# Patient Record
Sex: Male | Born: 1960 | Race: Black or African American | Hispanic: No | Smoking: Current every day smoker
Health system: Southern US, Community
[De-identification: ages and names within clinical notes are randomized; demographics above are authoritative.]

## PROBLEM LIST (undated history)

## (undated) DIAGNOSIS — M1990 Unspecified osteoarthritis, unspecified site: Secondary | ICD-10-CM

## (undated) DIAGNOSIS — G459 Transient cerebral ischemic attack, unspecified: Secondary | ICD-10-CM

## (undated) DIAGNOSIS — I10 Essential (primary) hypertension: Secondary | ICD-10-CM

## (undated) DIAGNOSIS — R0600 Dyspnea, unspecified: Secondary | ICD-10-CM

## (undated) DIAGNOSIS — S31119A Laceration without foreign body of abdominal wall, unspecified quadrant without penetration into peritoneal cavity, initial encounter: Secondary | ICD-10-CM

## (undated) DIAGNOSIS — I639 Cerebral infarction, unspecified: Secondary | ICD-10-CM

## (undated) DIAGNOSIS — F32A Depression, unspecified: Secondary | ICD-10-CM

## (undated) DIAGNOSIS — F419 Anxiety disorder, unspecified: Secondary | ICD-10-CM

## (undated) HISTORY — DX: Depression, unspecified: F32.A

## (undated) HISTORY — PX: OTHER SURGICAL HISTORY: SHX169

## (undated) HISTORY — DX: Transient cerebral ischemic attack, unspecified: G45.9

## (undated) HISTORY — DX: Anxiety disorder, unspecified: F41.9

---

## 2001-11-17 ENCOUNTER — Emergency Department (HOSPITAL_COMMUNITY): Admission: EM | Admit: 2001-11-17 | Discharge: 2001-11-17 | Payer: Self-pay | Admitting: Internal Medicine

## 2004-02-21 ENCOUNTER — Emergency Department (HOSPITAL_COMMUNITY): Admission: EM | Admit: 2004-02-21 | Discharge: 2004-02-21 | Payer: Self-pay | Admitting: Emergency Medicine

## 2005-01-04 ENCOUNTER — Emergency Department (HOSPITAL_COMMUNITY): Admission: EM | Admit: 2005-01-04 | Discharge: 2005-01-04 | Payer: Self-pay | Admitting: Emergency Medicine

## 2005-05-01 ENCOUNTER — Ambulatory Visit (HOSPITAL_COMMUNITY): Admission: RE | Admit: 2005-05-01 | Discharge: 2005-05-01 | Payer: Self-pay | Admitting: Internal Medicine

## 2005-05-01 IMAGING — CR DG LUMBAR SPINE COMPLETE 4+V
5 series · 5 of 5 positions shown · non-contrast
Comparison: none

HISTORY: Back pain

LUMBAR SPINE 4 VIEWS:
5 lumbar vertebra.
Vertebral body and disc space heights maintained.
No fracture or subluxation.
Tiny superior endplate spur anteriorly at T12.
No spondylolysis or bone destruction.

[view not recorded (1 of 5)]
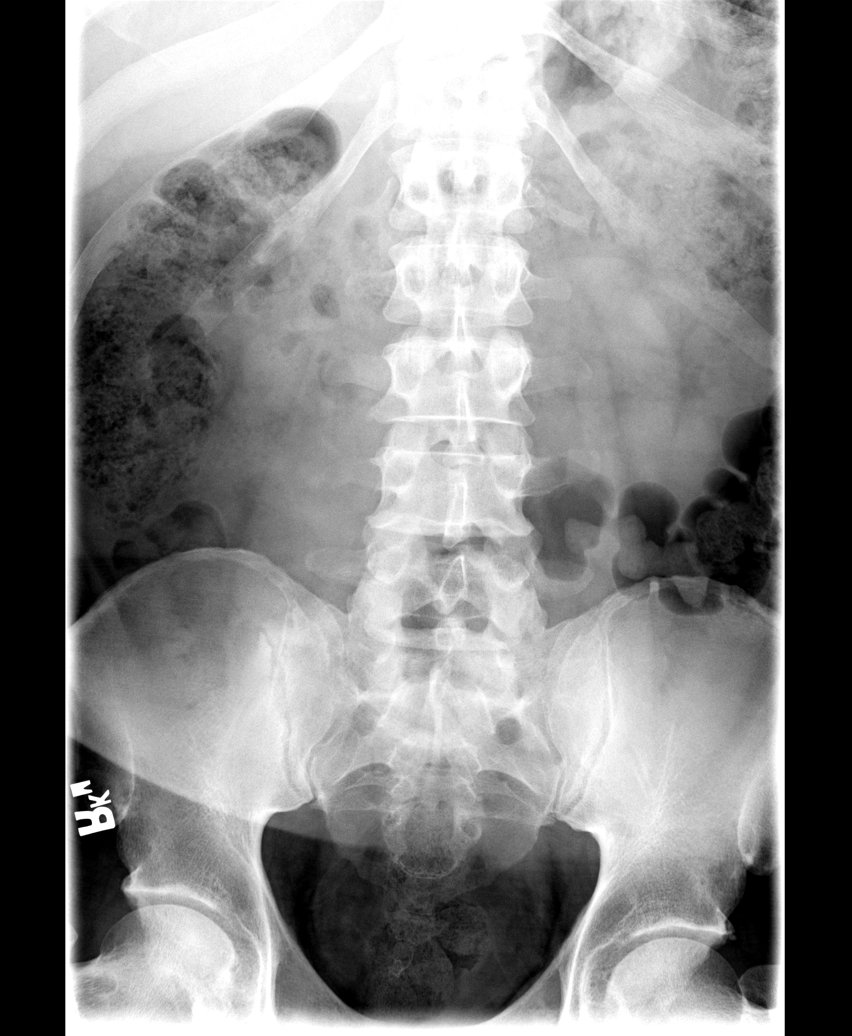

[view not recorded (2 of 5)]
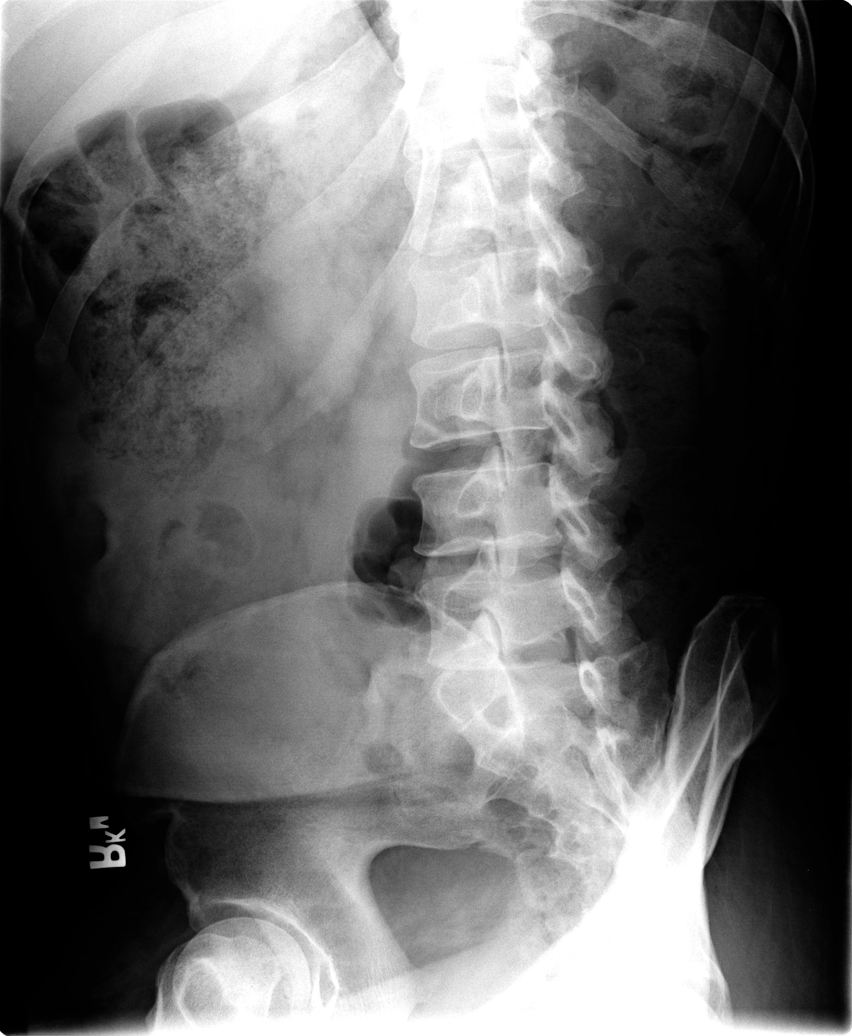

[view not recorded (3 of 5)]
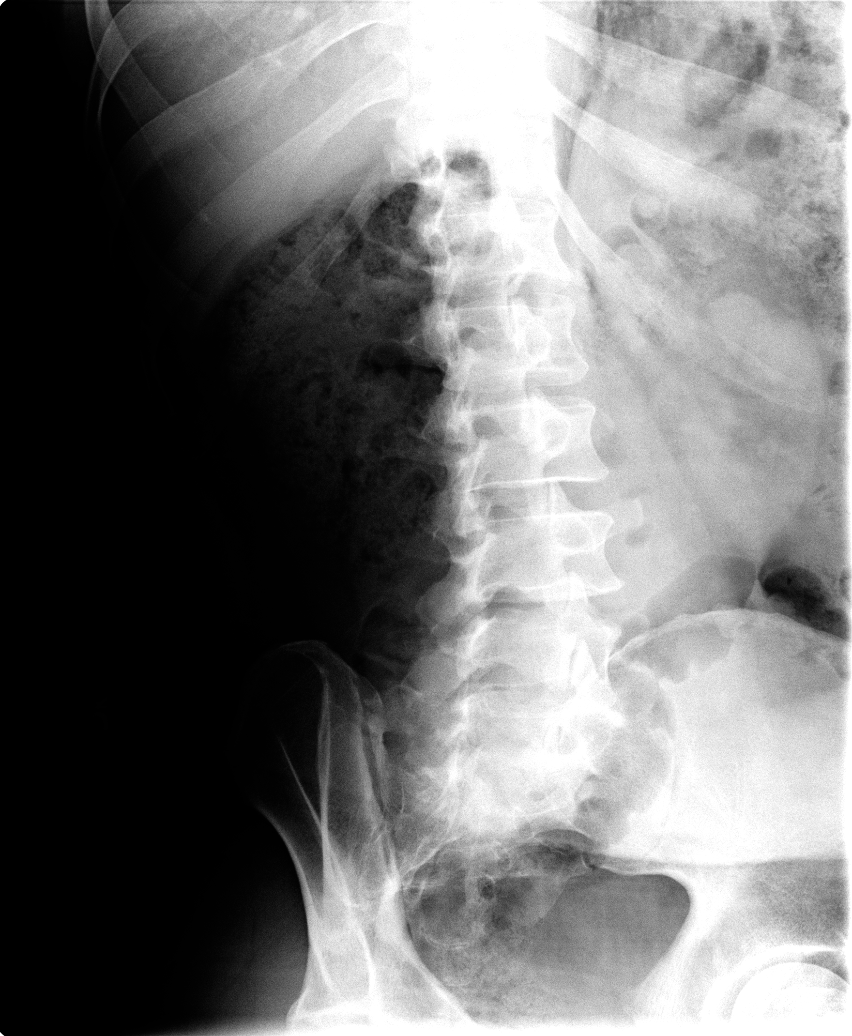

[view not recorded (4 of 5)]
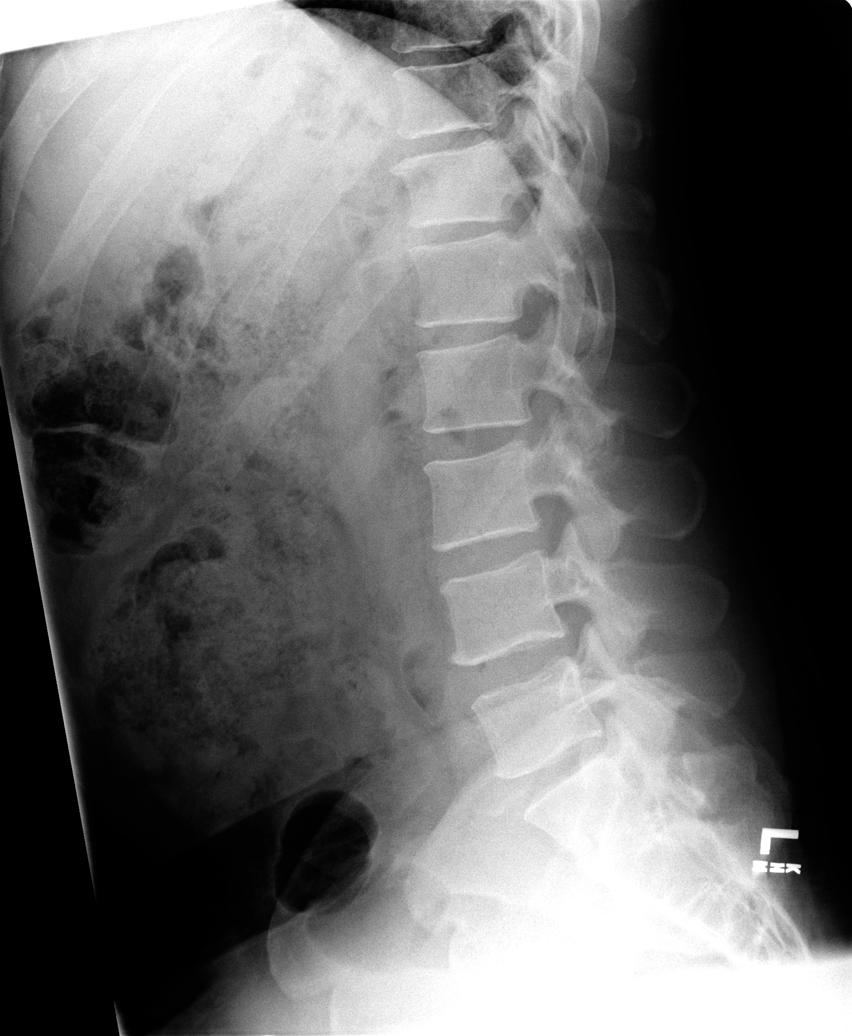

[view not recorded (5 of 5)]
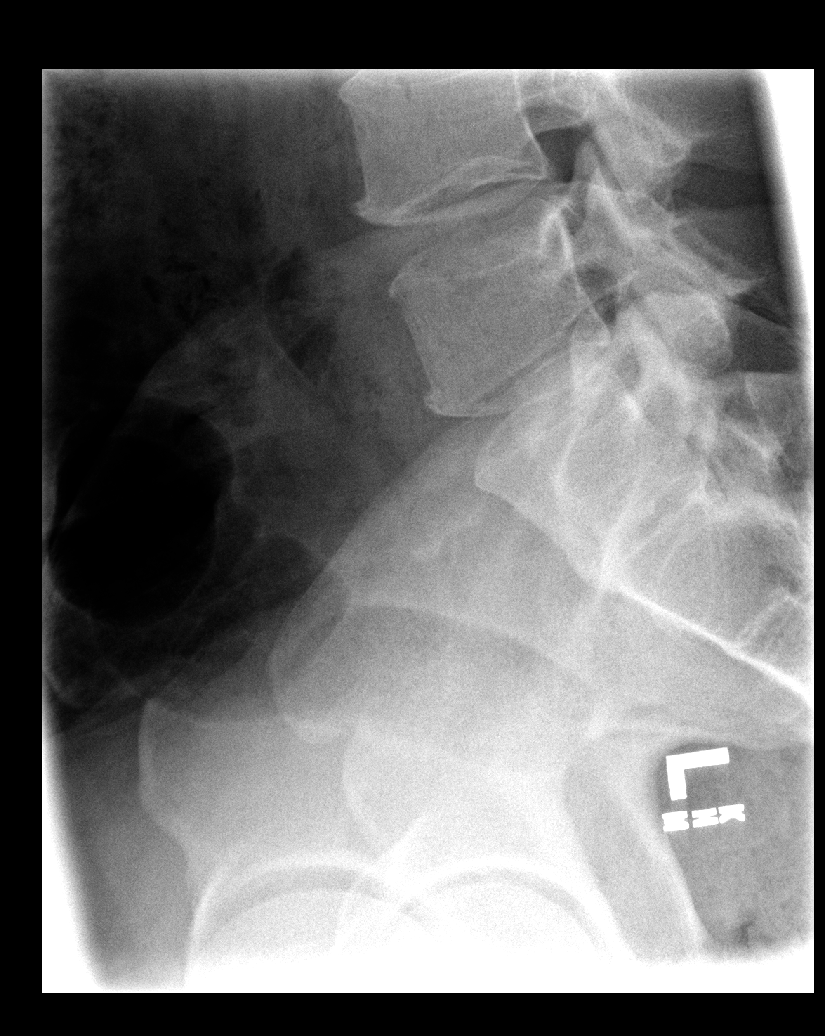

[5 of 5 positions shown; findings below may reference images not displayed]

IMPRESSION: No acute abnormalities.

## 2005-09-22 ENCOUNTER — Emergency Department (HOSPITAL_COMMUNITY): Admission: EM | Admit: 2005-09-22 | Discharge: 2005-09-22 | Payer: Self-pay | Admitting: Emergency Medicine
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
## 2006-05-14 ENCOUNTER — Emergency Department (HOSPITAL_COMMUNITY): Admission: EM | Admit: 2006-05-14 | Discharge: 2006-05-14 | Payer: Self-pay | Admitting: Emergency Medicine

## 2006-05-14 IMAGING — CR DG CERVICAL SPINE WITH FLEX & EXTEND
7 series · 7 of 7 positions shown · non-contrast
Comparison: [DATE].

CLINICAL DATA: Bilateral arm pain for one week.  
 COMPLETE CERVICAL SPINE WITH LATERAL FLEXION AND EXTENSION VIEWS ? 6 VIEWS ? [DATE]:

[view not recorded (1 of 7)]
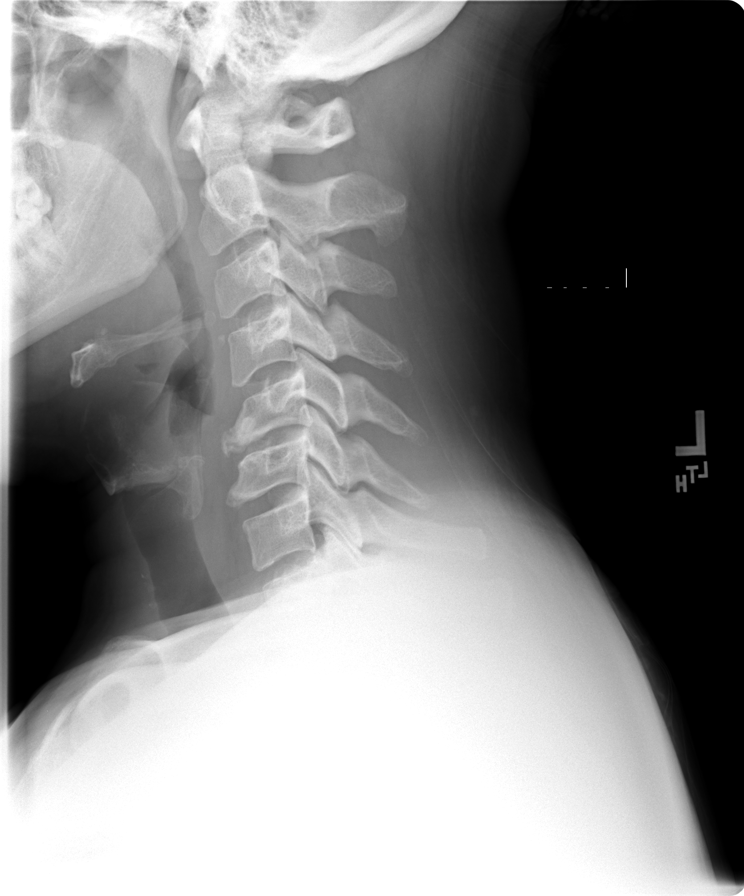

[view not recorded (2 of 7)]
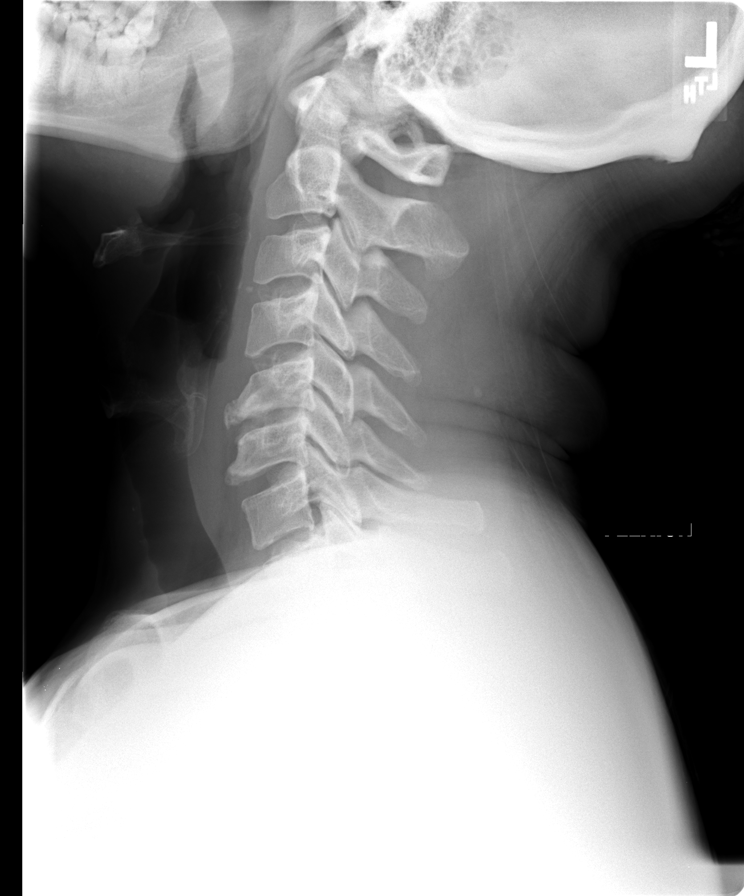

[view not recorded (3 of 7)]
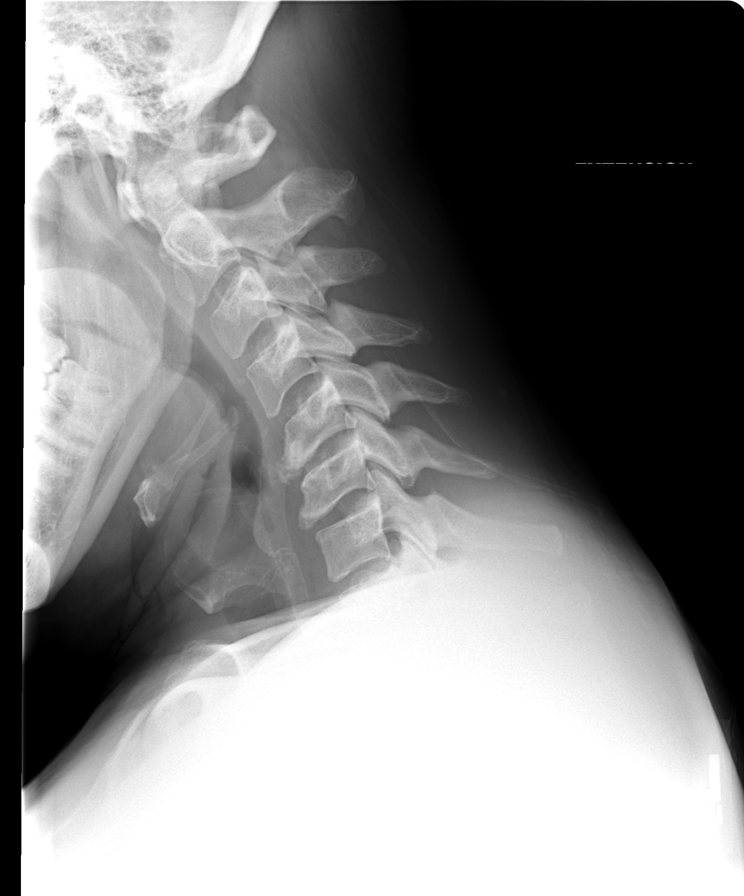

[view not recorded (4 of 7)]
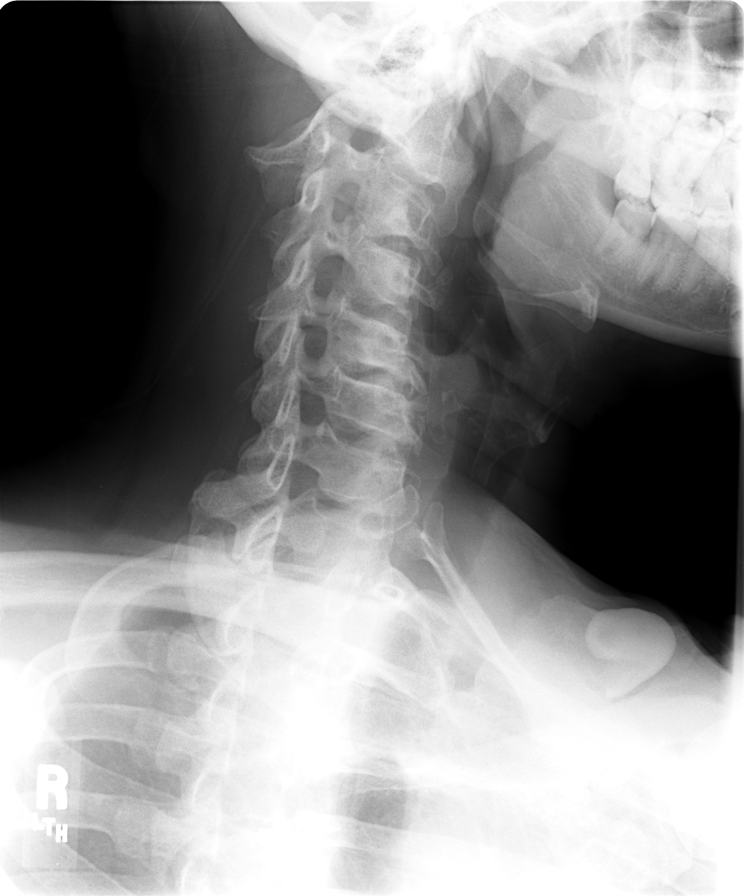

[view not recorded (5 of 7)]
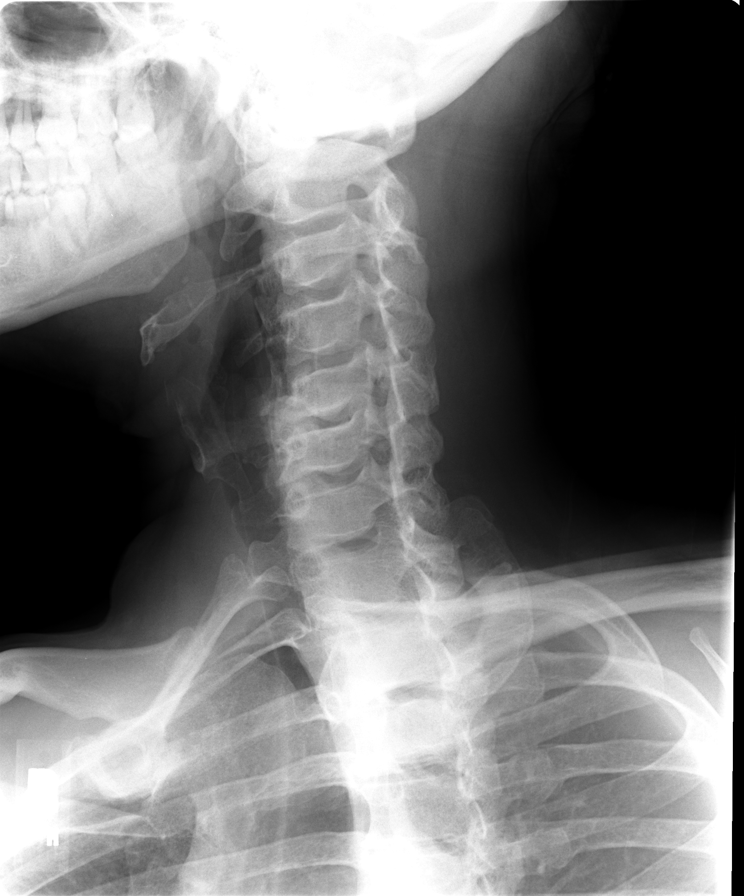

[view not recorded (6 of 7)]
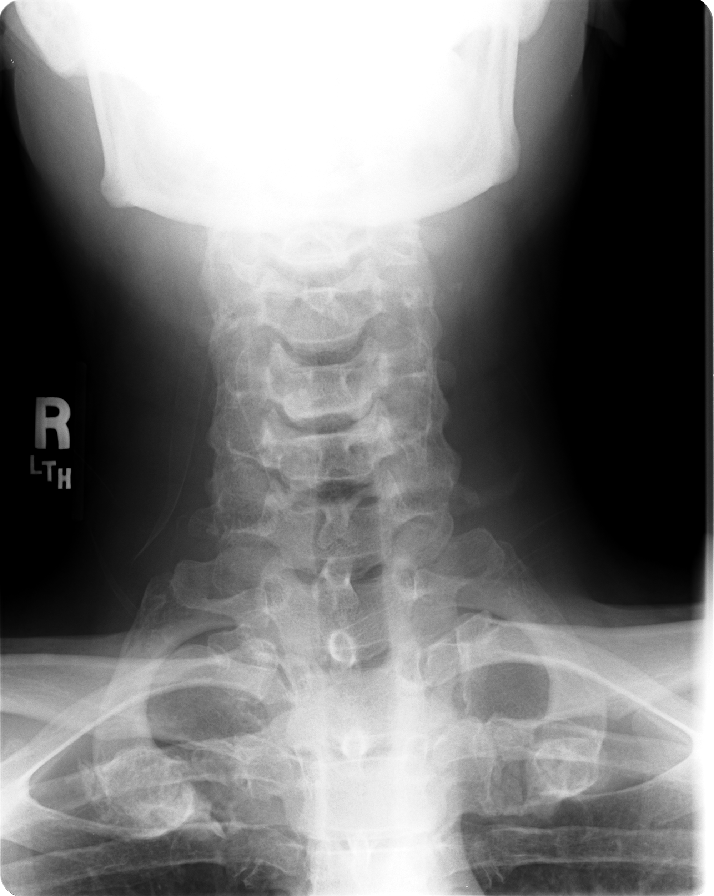

[view not recorded (7 of 7)]
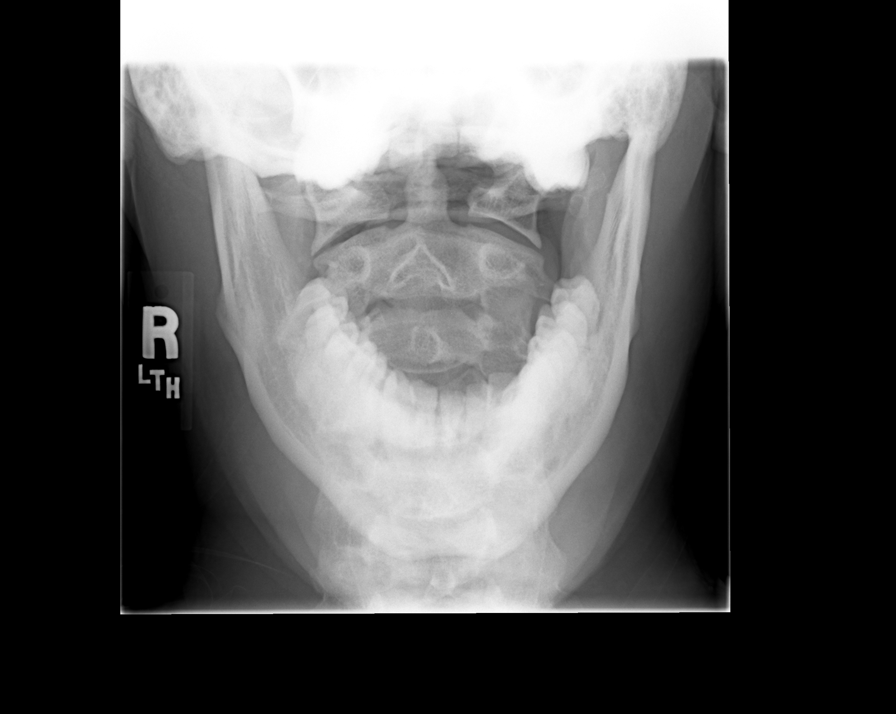

[7 of 7 positions shown; findings below may reference images not displayed]

FINDINGS: The patient has some degenerative disk disease at C5-6 with slight disk space narrowing and some mild posterior spurring and slightly more prominent spurring at the anterior-inferior aspects of C5 and C6.  There is approximately 2 mm of retrolisthesis of C5 on C6 in the neutral position with a reversal of the cervical lordosis at that level.  With flexion the minimal retrolisthesis is completely reduced.  There is no abnormal anterior subluxation with flexion.  With extension, there is no increase in the retrolisthesis.  The AP dimension of the spinal canal appears normal on the lateral views.  The neural foramina are widely patent throughout the cervical spine.  There is no significant facet joint disease.
IMPRESSION: No acute abnormality.   Mild degenerative disk disease at C5-6 with some spurring as described.  No change since [DATE].

## 2007-11-15 ENCOUNTER — Emergency Department (HOSPITAL_COMMUNITY): Admission: EM | Admit: 2007-11-15 | Discharge: 2007-11-15 | Payer: Self-pay | Admitting: Emergency Medicine

## 2009-02-01 ENCOUNTER — Emergency Department (HOSPITAL_COMMUNITY): Admission: EM | Admit: 2009-02-01 | Discharge: 2009-02-01 | Payer: Self-pay | Admitting: Emergency Medicine

## 2009-02-24 ENCOUNTER — Emergency Department (HOSPITAL_COMMUNITY): Admission: EM | Admit: 2009-02-24 | Discharge: 2009-02-24 | Payer: Self-pay | Admitting: Emergency Medicine

## 2009-02-24 IMAGING — CR DG ANKLE COMPLETE 3+V*R*
3 series · 3 of 3 positions shown · non-contrast
Comparison: None

CLINICAL DATA: Injury, lateral pain.

RIGHT ANKLE - COMPLETE 3+ VIEW

[view not recorded (1 of 3)]
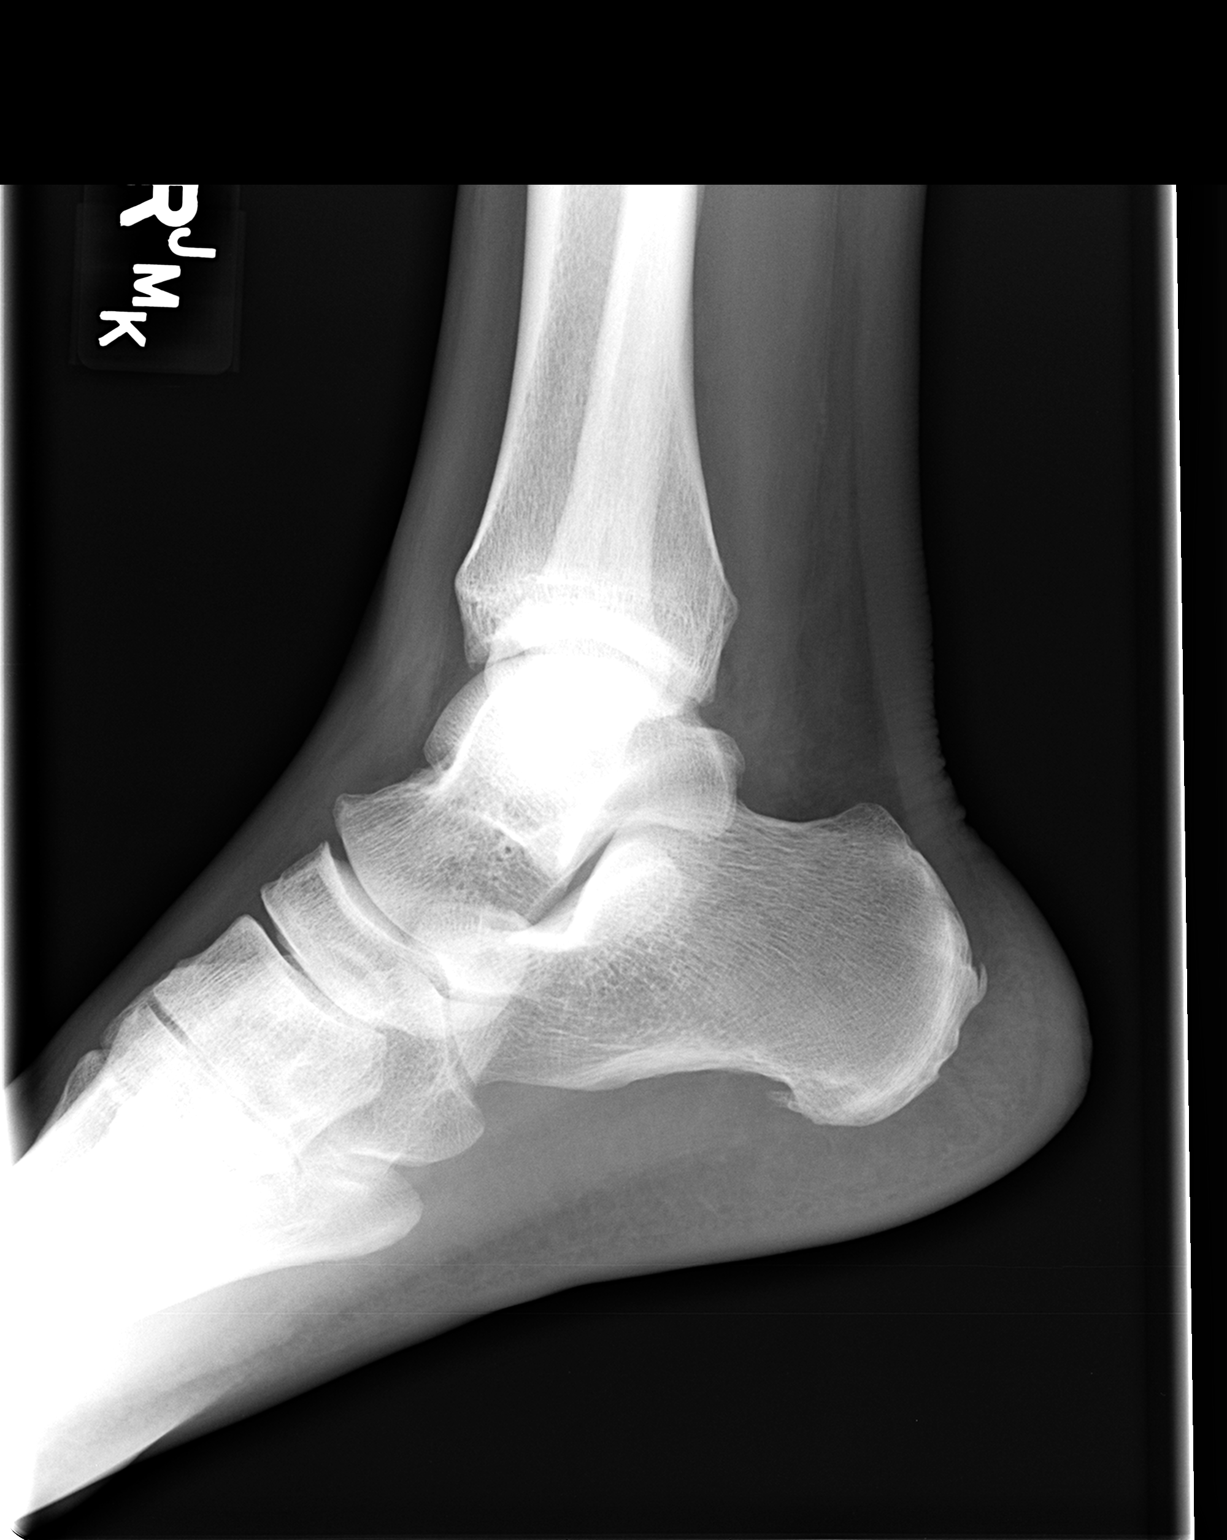

[view not recorded (2 of 3)]
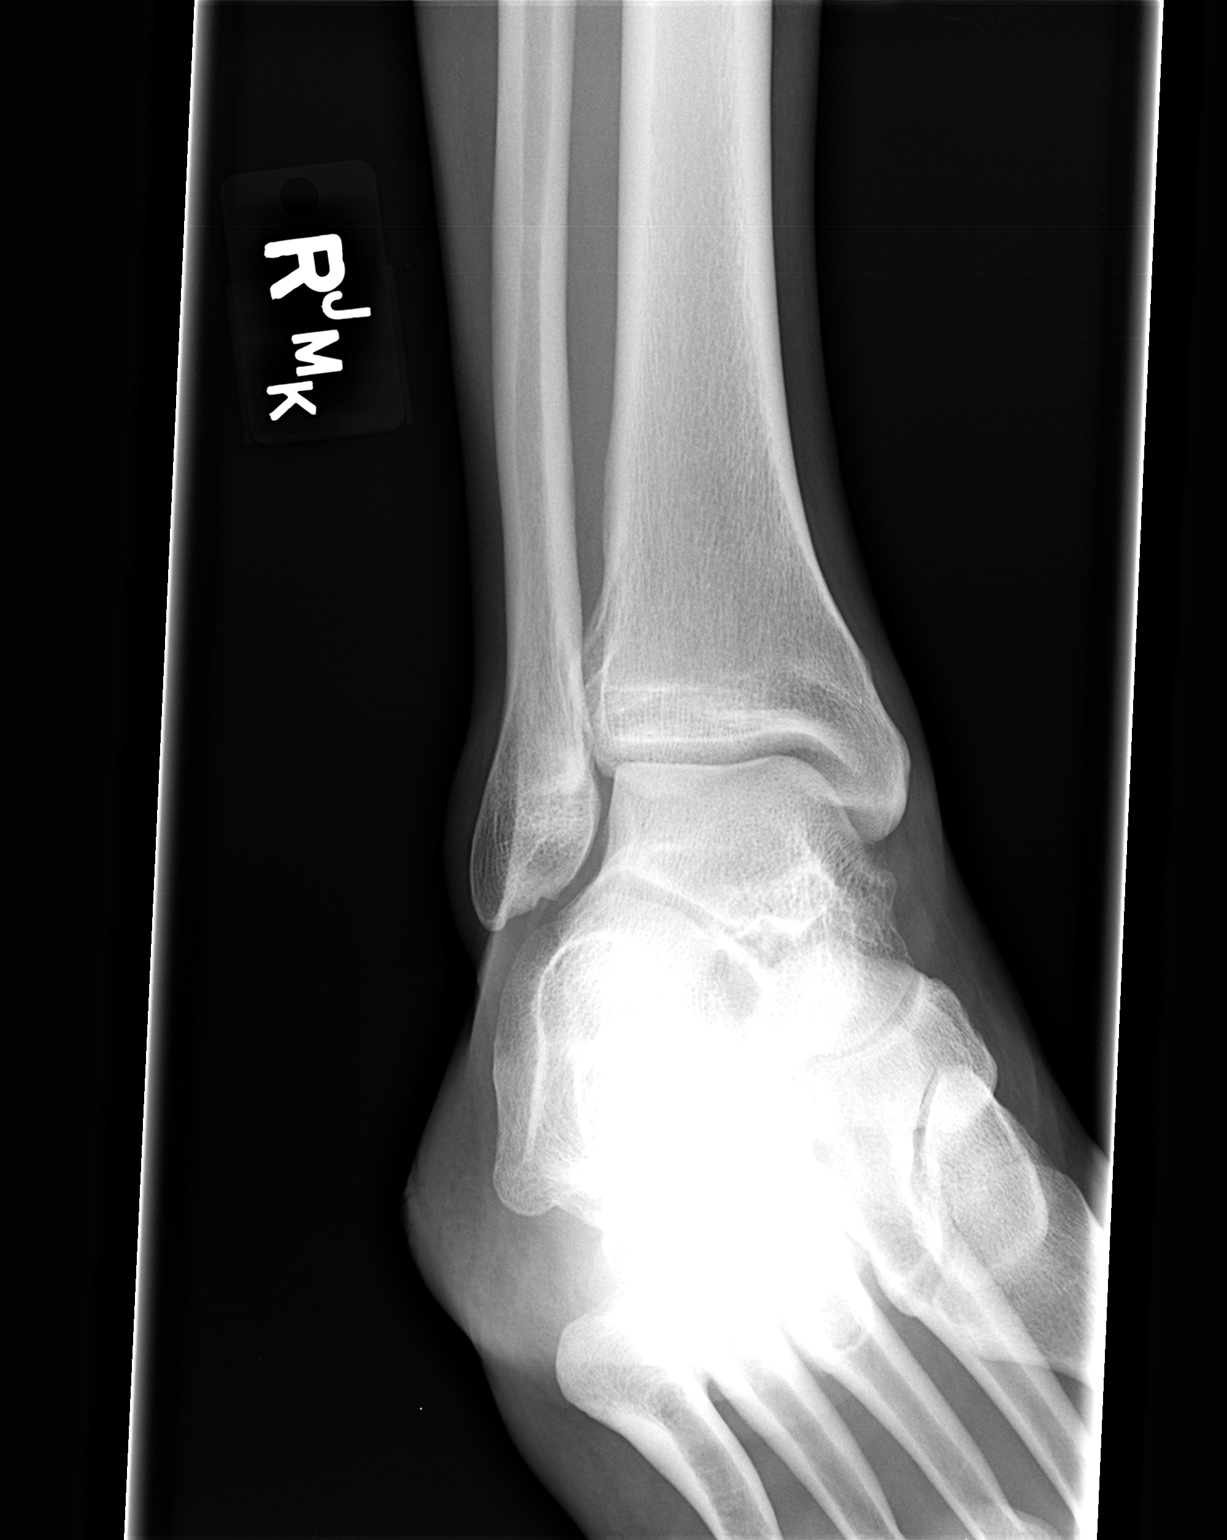

[view not recorded (3 of 3)]
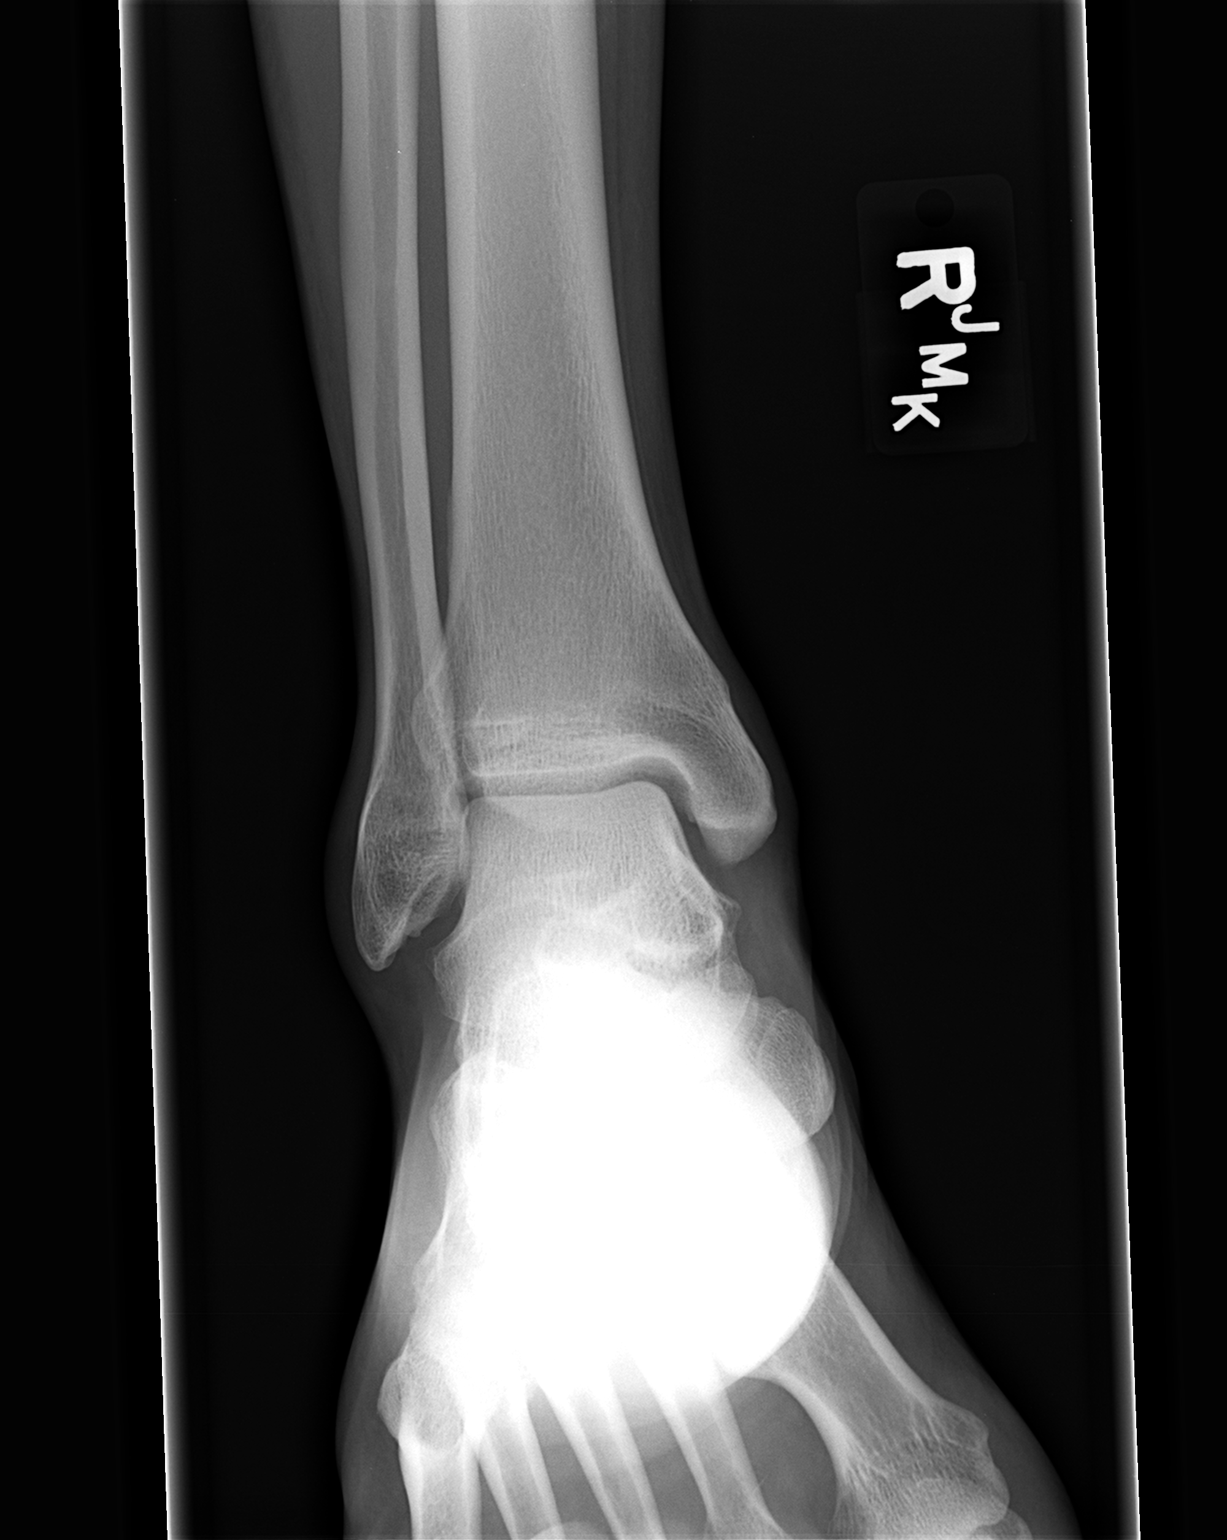

[3 of 3 positions shown; findings below may reference images not displayed]

FINDINGS: No acute bony abnormality.  Specifically, no fracture,
subluxation, or dislocation.  Soft tissues are intact. Calcaneal
spur noted.
IMPRESSION: No acute findings.

## 2010-03-31 ENCOUNTER — Emergency Department (HOSPITAL_COMMUNITY): Admission: EM | Admit: 2010-03-31 | Discharge: 2010-04-01 | Payer: Self-pay | Admitting: Emergency Medicine

## 2010-03-31 IMAGING — CR DG ANKLE COMPLETE 3+V*R*
3 series · 3 of 3 positions shown · non-contrast
Comparison: None.

CLINICAL DATA: Fall, pain.

RIGHT ANKLE - COMPLETE 3+ VIEW

[view not recorded (1 of 3)]
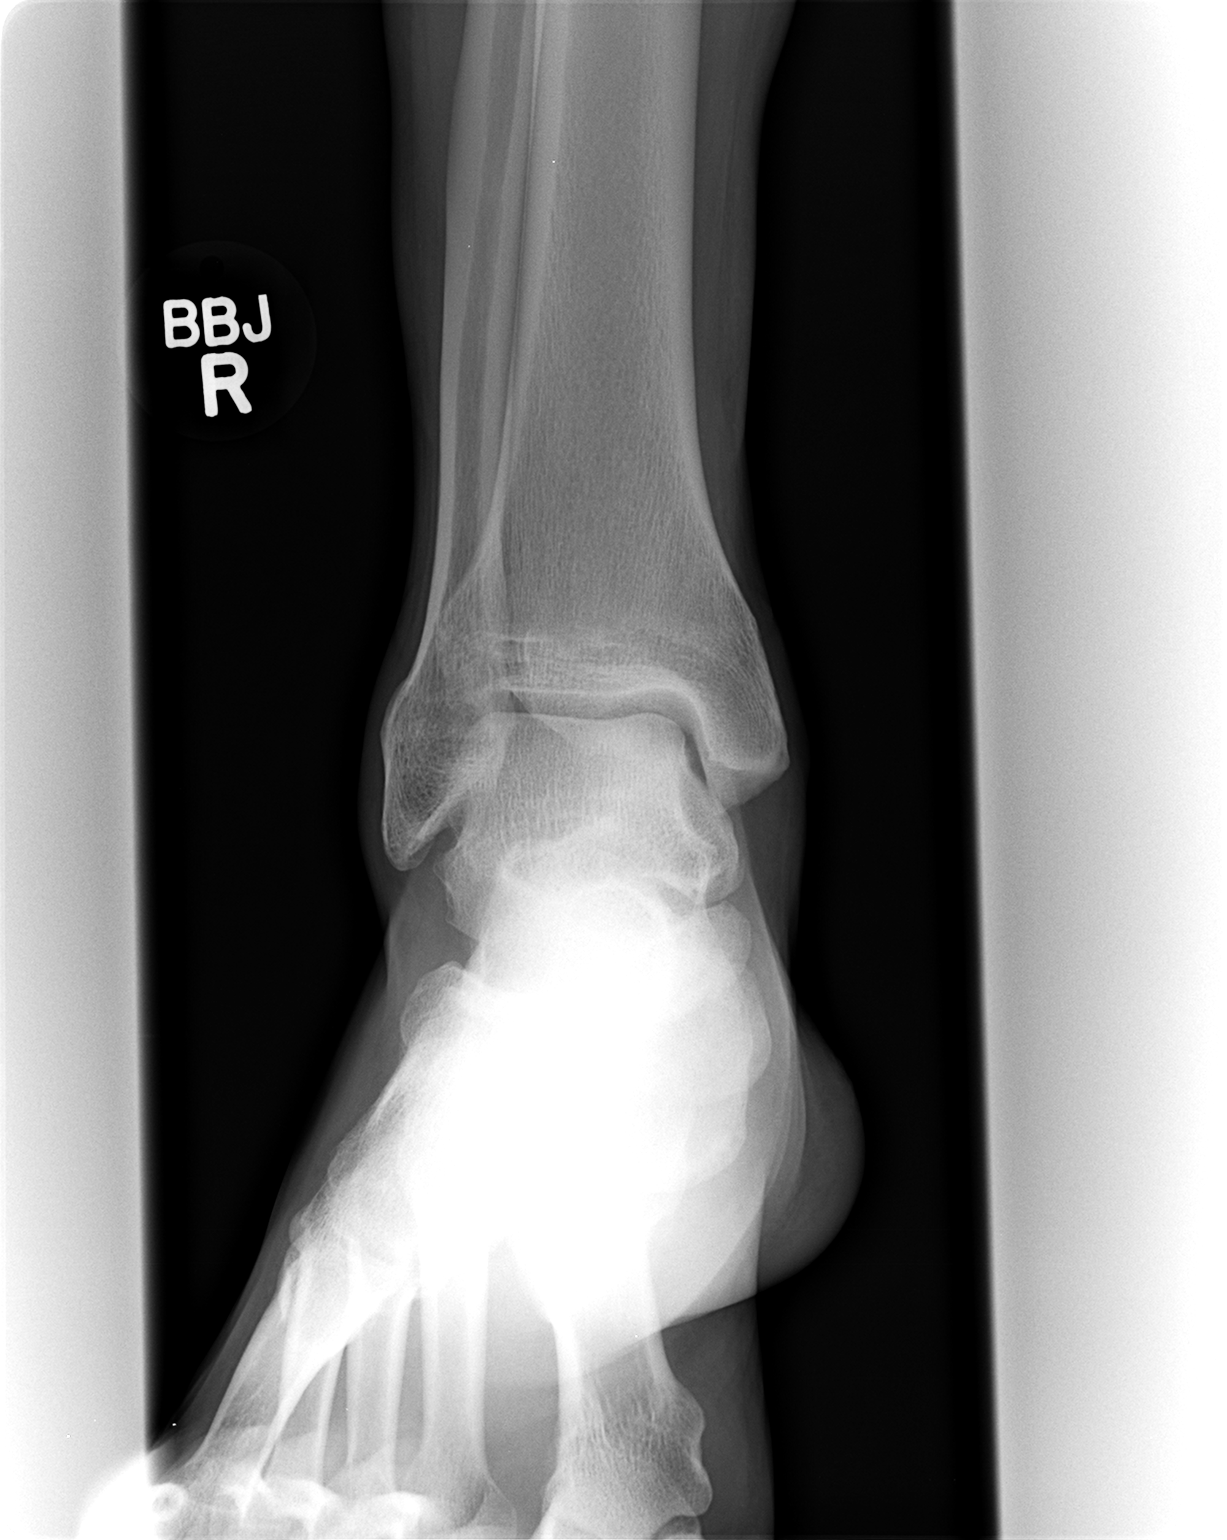

[view not recorded (2 of 3)]
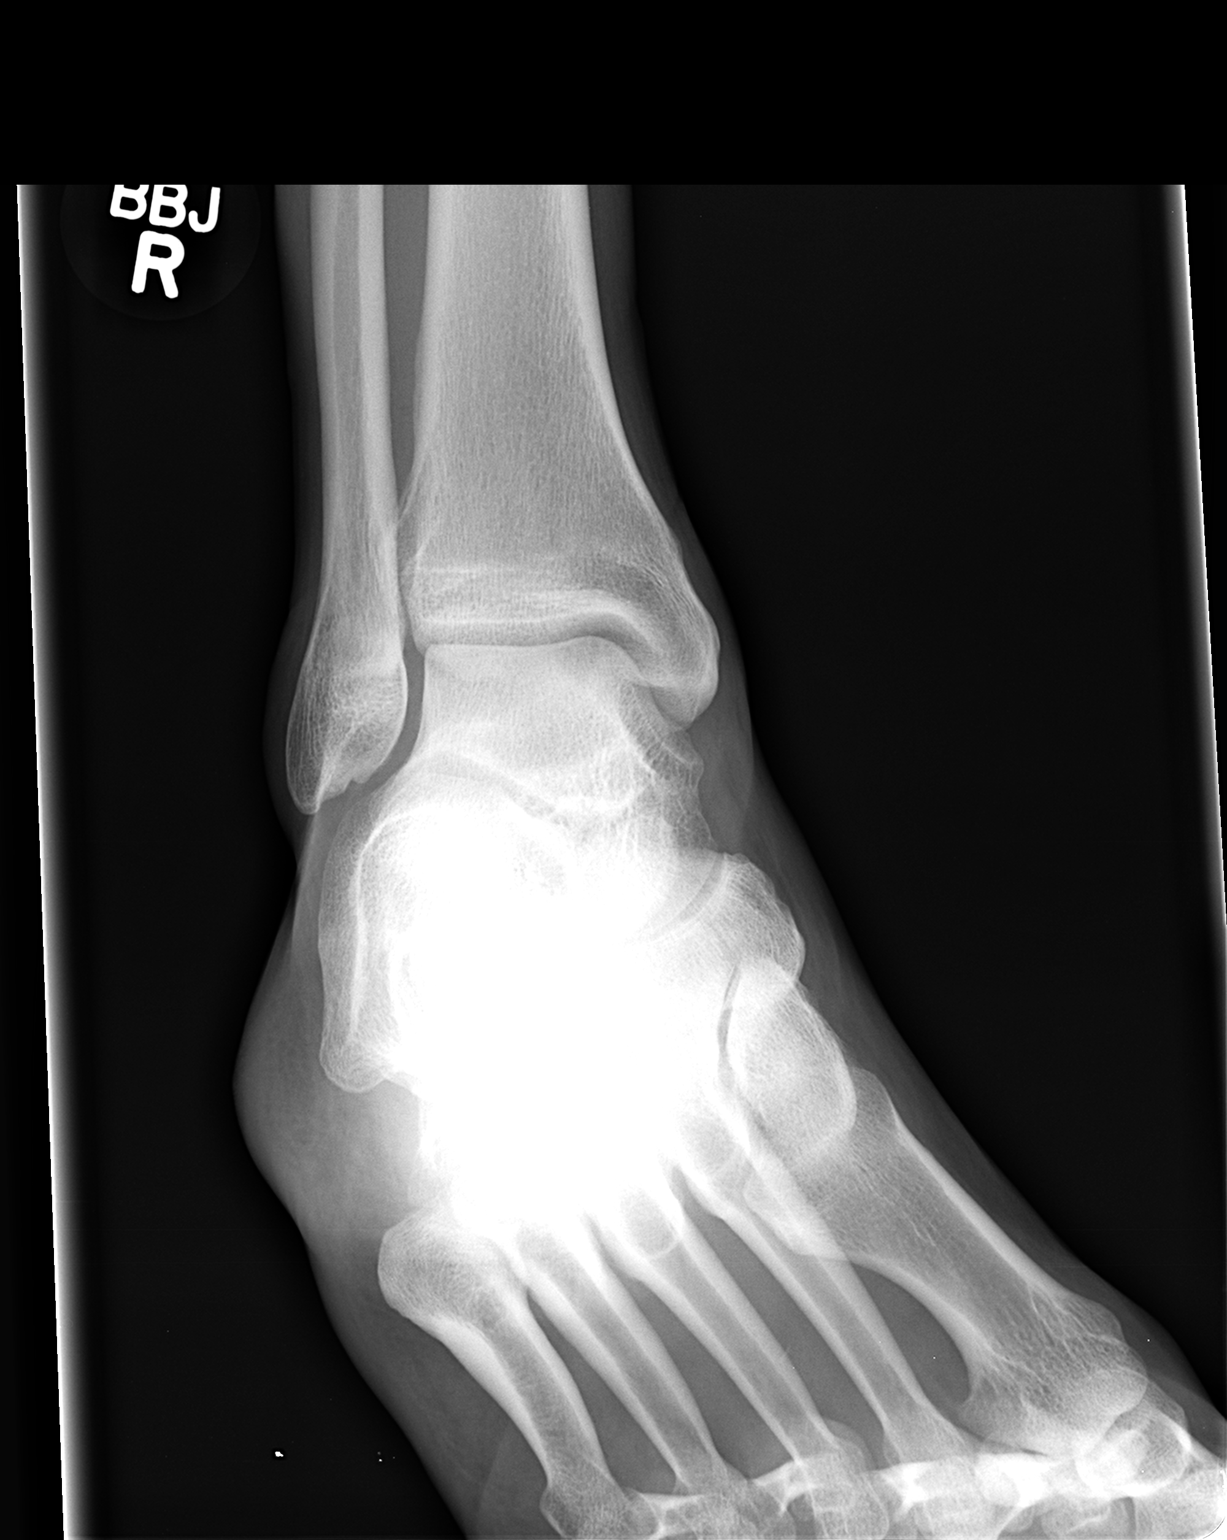

[view not recorded (3 of 3)]
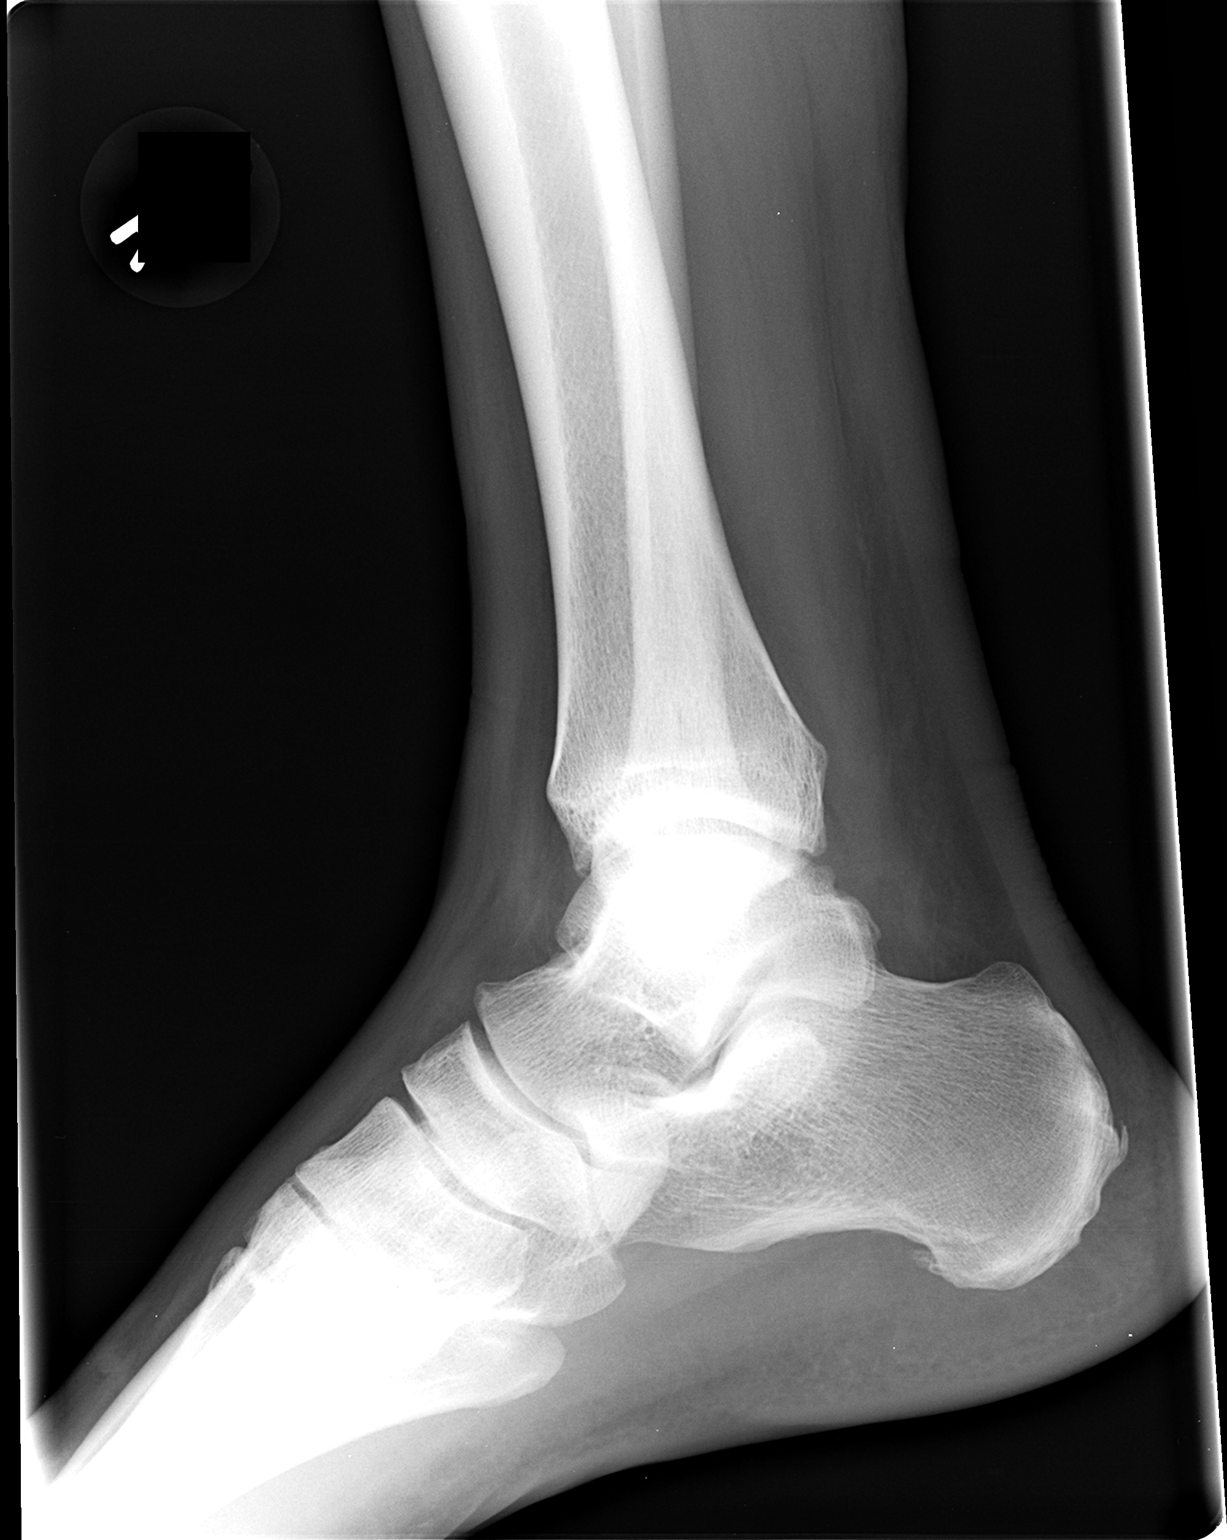

[3 of 3 positions shown; findings below may reference images not displayed]

FINDINGS: No acute bony or joint abnormality is identified.  Soft
tissues unremarkable.
IMPRESSION: No acute finding.

## 2010-03-31 IMAGING — CR DG TIBIA/FIBULA 2V*R*
2 series · 2 of 2 positions shown · non-contrast
Comparison: None.

CLINICAL DATA: Fall, pain.

RIGHT TIBIA AND FIBULA - 2 VIEW

[view not recorded (1 of 2)]
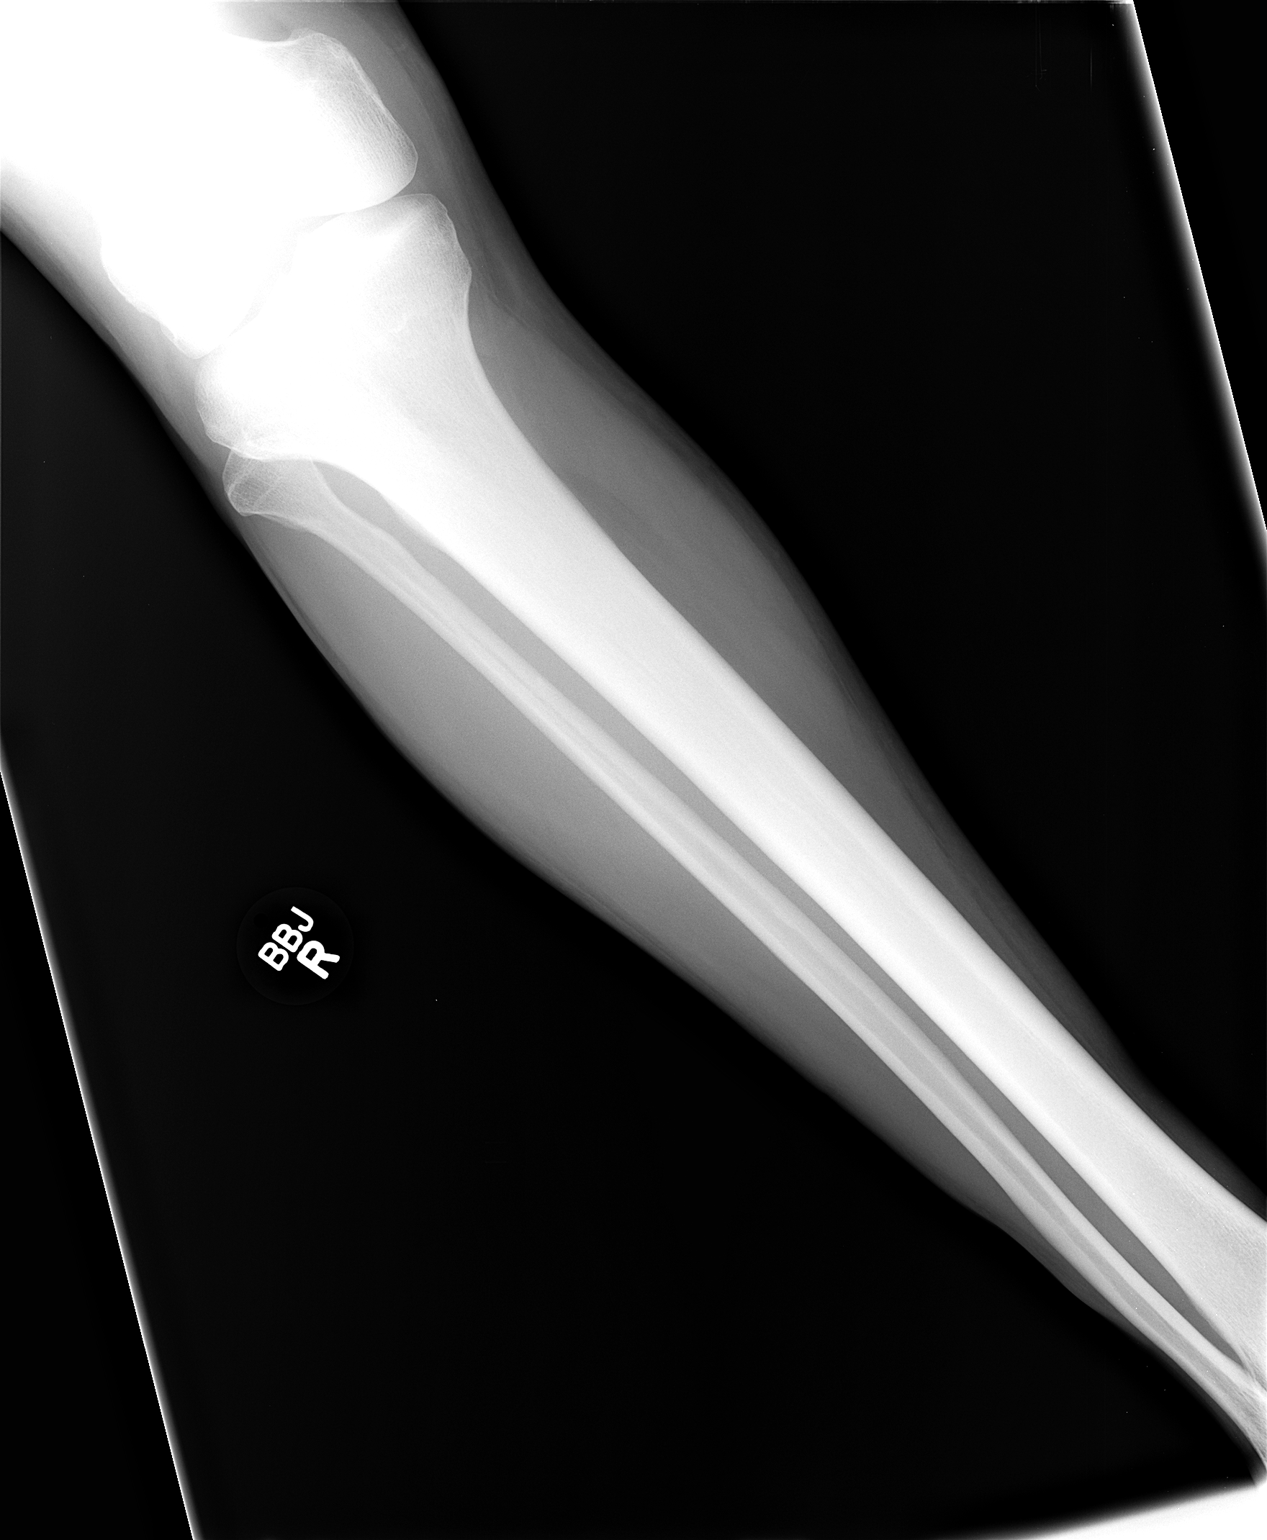

[view not recorded (2 of 2)]
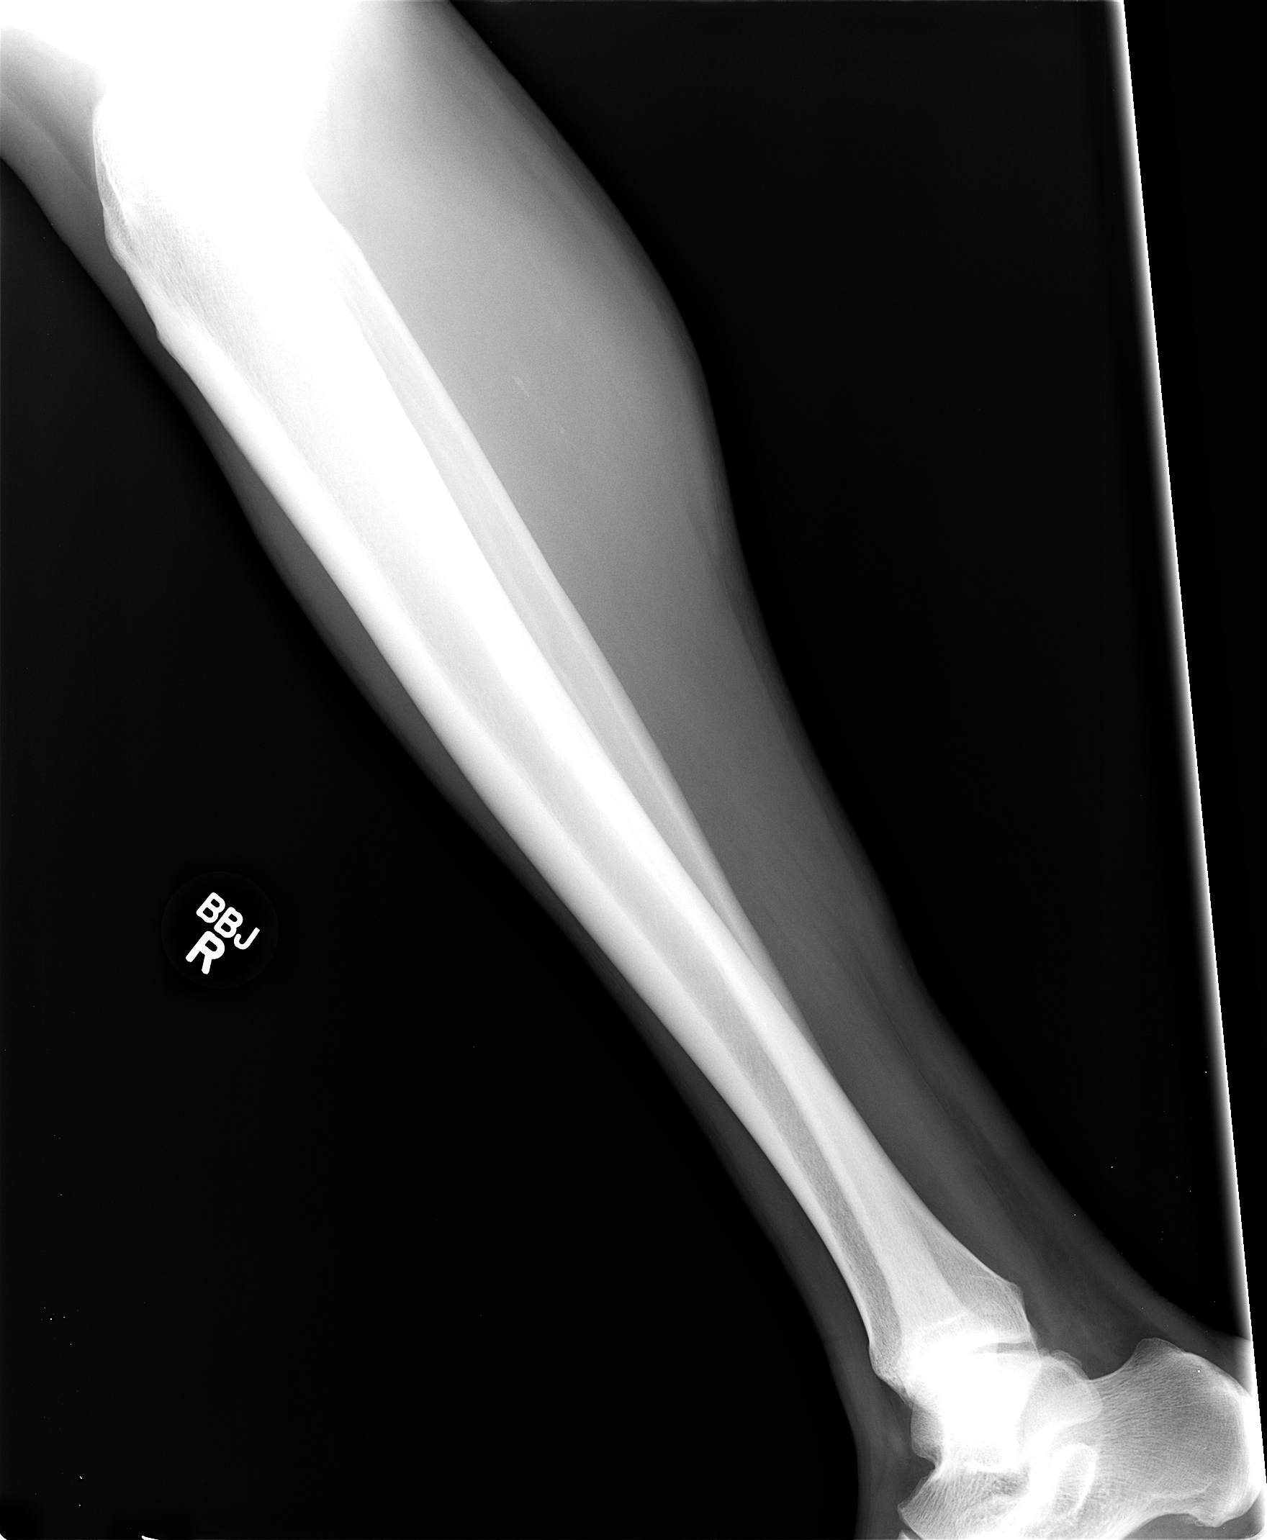

[2 of 2 positions shown; findings below may reference images not displayed]

FINDINGS: Imaged bones, joints and soft tissues appear normal.
IMPRESSION: Negative exam.

## 2010-03-31 IMAGING — CR DG KNEE COMPLETE 4+V*R*
4 series · 4 of 4 positions shown · non-contrast
Comparison: None.

CLINICAL DATA: Fall, pain.

RIGHT KNEE - COMPLETE 4+ VIEW

[view not recorded (1 of 4)]
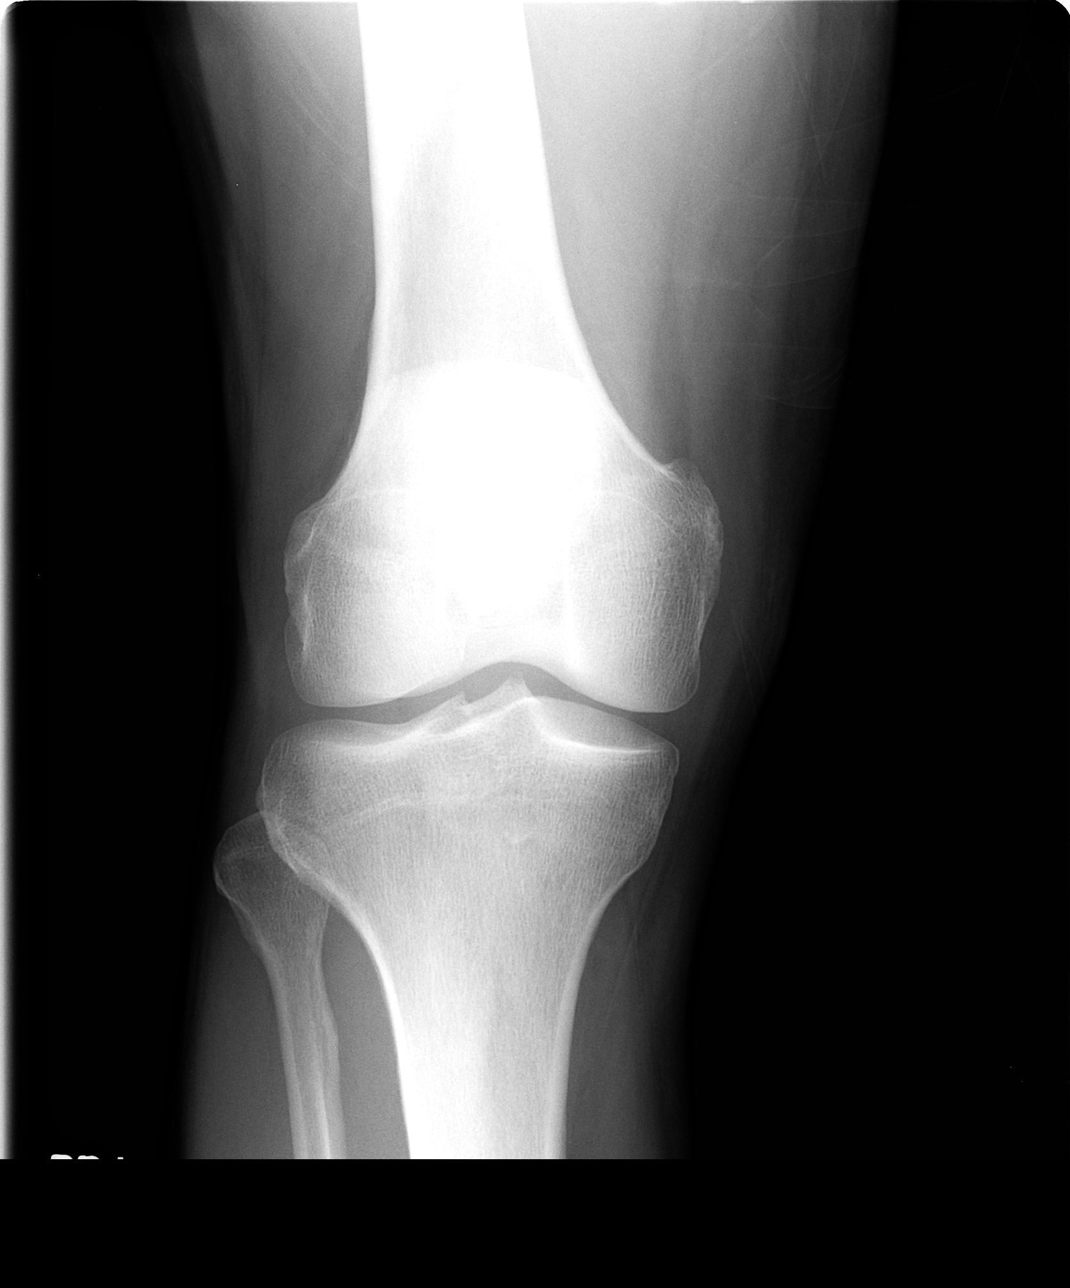

[view not recorded (2 of 4)]
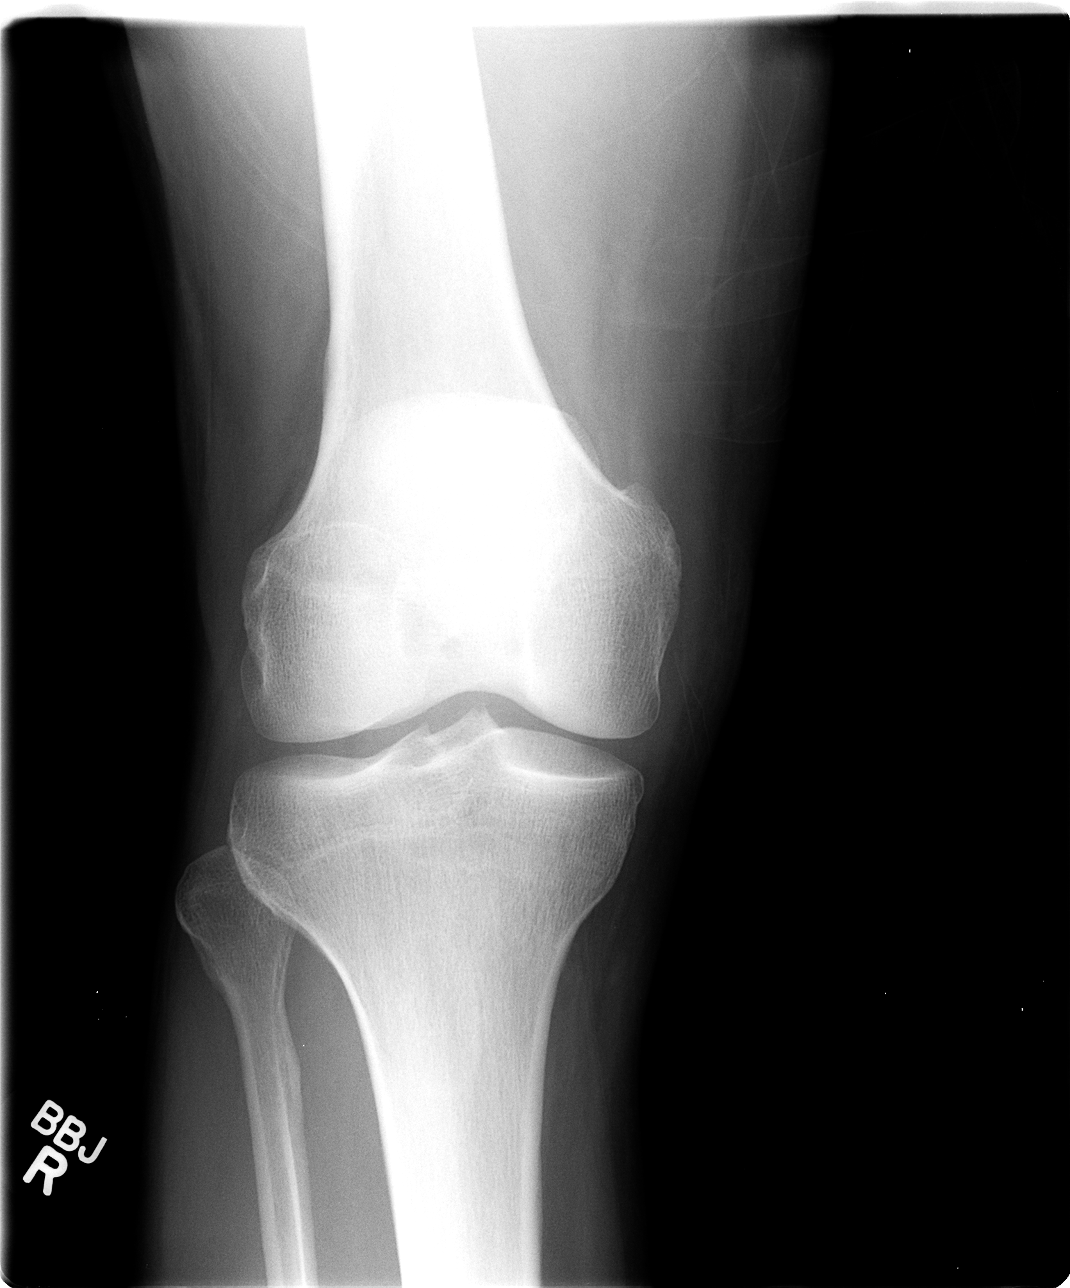

[view not recorded (3 of 4)]
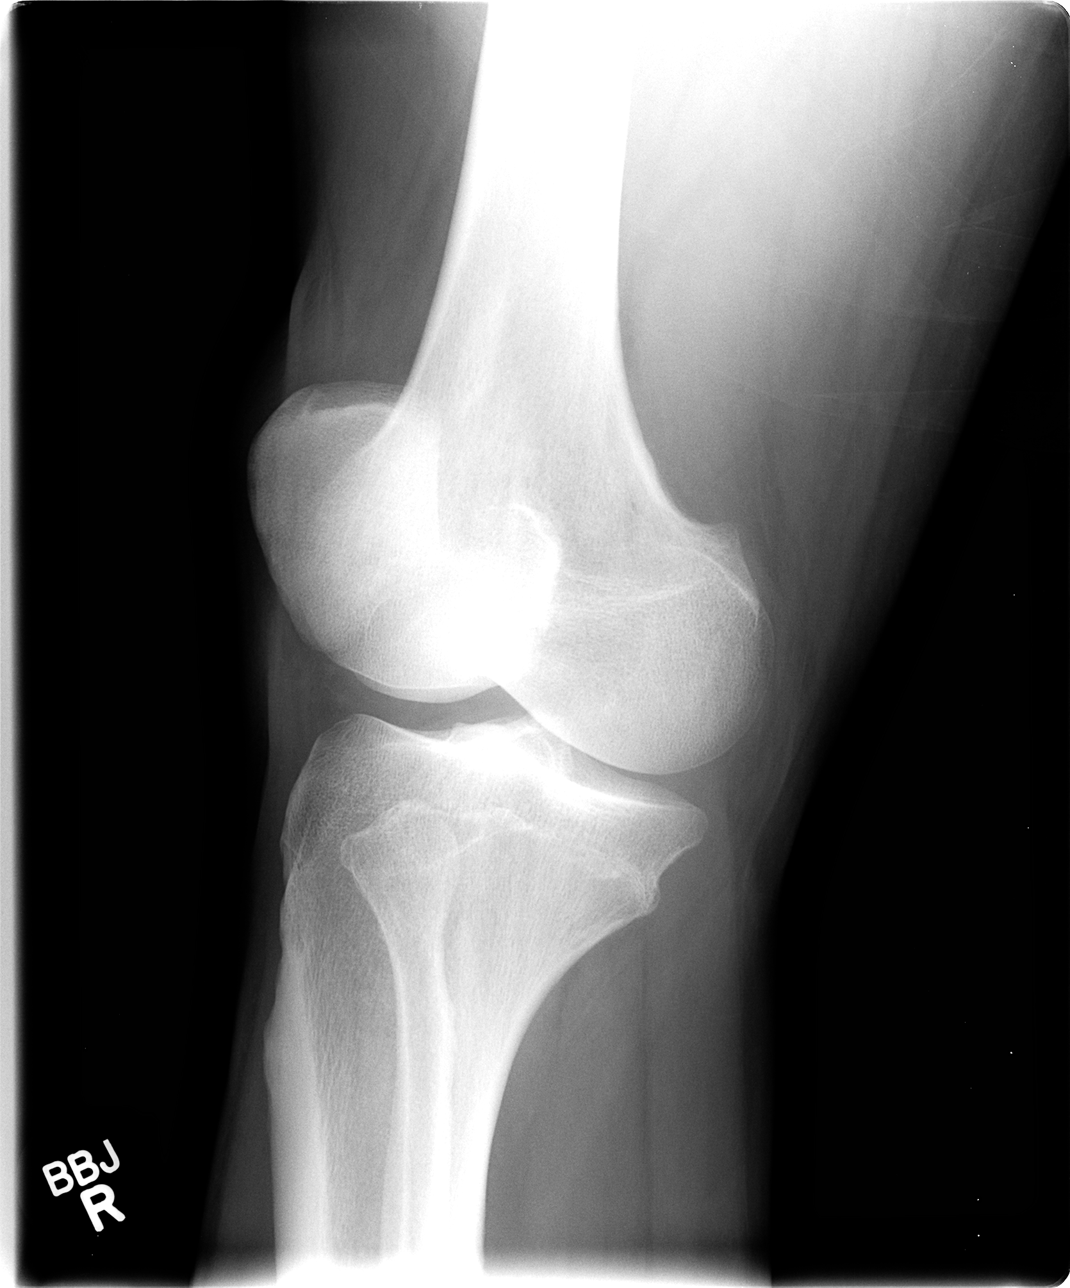

[view not recorded (4 of 4)]
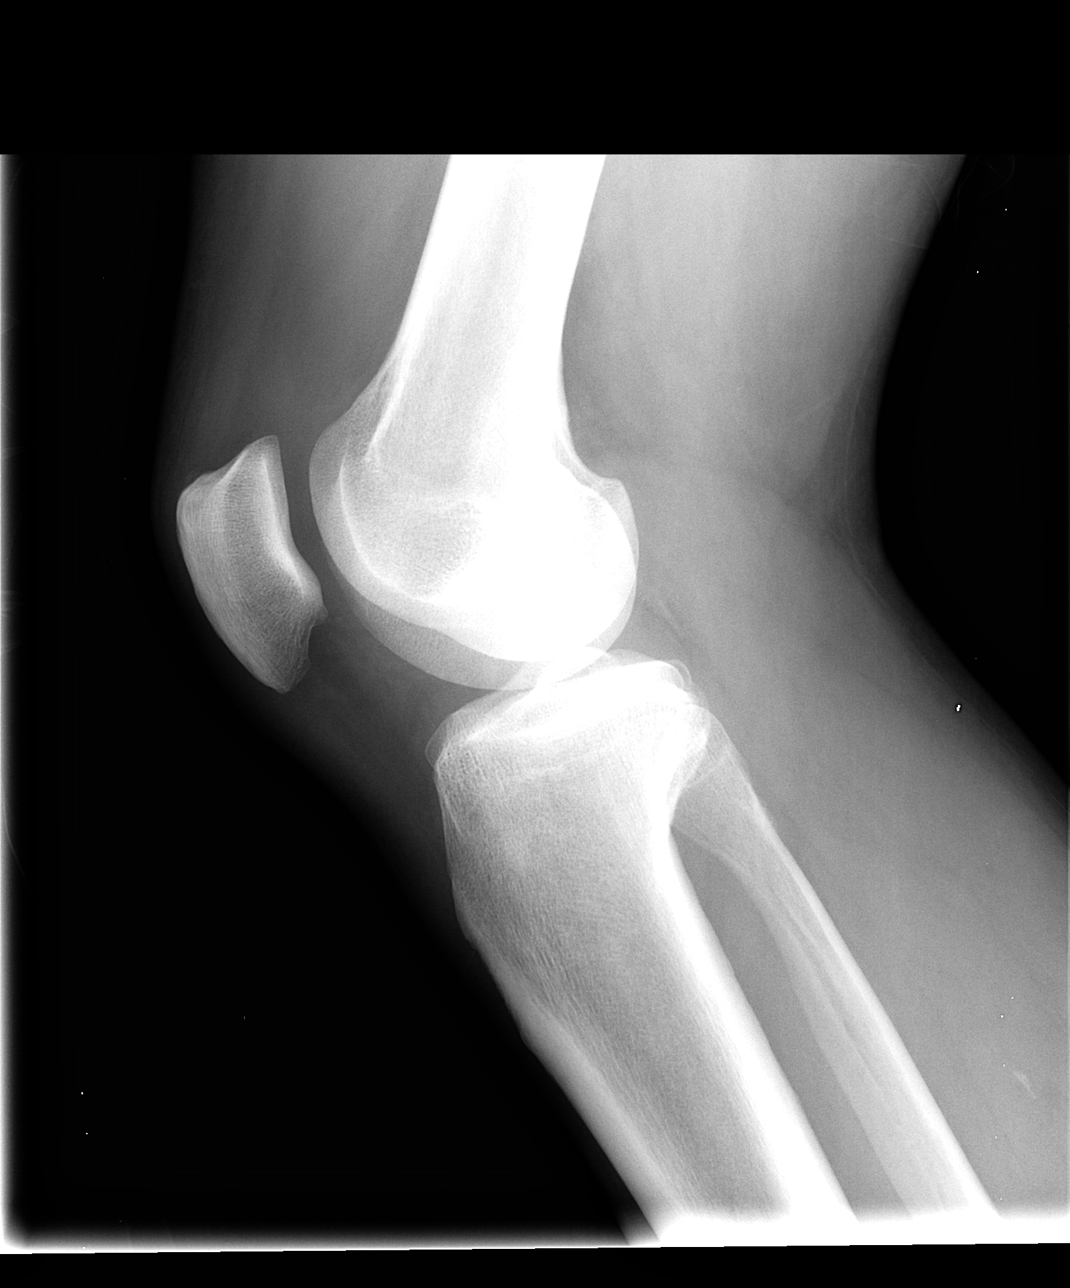

[4 of 4 positions shown; findings below may reference images not displayed]

FINDINGS: Imaged bones, joints and soft tissues appear normal.
IMPRESSION: Negative exam.

## 2010-05-23 ENCOUNTER — Emergency Department (HOSPITAL_COMMUNITY): Admission: EM | Admit: 2010-05-23 | Discharge: 2010-05-23 | Payer: Self-pay | Admitting: Emergency Medicine

## 2011-03-06 ENCOUNTER — Emergency Department (HOSPITAL_COMMUNITY)
Admission: EM | Admit: 2011-03-06 | Discharge: 2011-03-06 | Disposition: A | Discharge status: Home / Self Care | Payer: BC Managed Care – PPO | Attending: Emergency Medicine | Admitting: Emergency Medicine

## 2011-03-06 DIAGNOSIS — L02219 Cutaneous abscess of trunk, unspecified: Secondary | ICD-10-CM | POA: Insufficient documentation

## 2011-07-23 LAB — BASIC METABOLIC PANEL
BUN: 13
CO2: 28
Calcium: 9.5
Chloride: 103
Creatinine, Ser: 0.93
GFR calc Af Amer: >60
GFR calc non Af Amer: >60
Glucose, Bld: 98
Potassium: 3.8
Sodium: 138

## 2011-09-17 ENCOUNTER — Emergency Department (HOSPITAL_COMMUNITY)
Admission: EM | Admit: 2011-09-17 | Discharge: 2011-09-17 | Disposition: A | Discharge status: Home / Self Care | Payer: BC Managed Care – PPO | Attending: Emergency Medicine | Admitting: Emergency Medicine

## 2011-09-17 ENCOUNTER — Emergency Department (HOSPITAL_COMMUNITY): Payer: BC Managed Care – PPO

## 2011-09-17 ENCOUNTER — Encounter: Payer: Self-pay | Admitting: *Deleted

## 2011-09-17 DIAGNOSIS — F17200 Nicotine dependence, unspecified, uncomplicated: Secondary | ICD-10-CM | POA: Insufficient documentation

## 2011-09-17 DIAGNOSIS — M25569 Pain in unspecified knee: Secondary | ICD-10-CM | POA: Insufficient documentation

## 2011-09-17 DIAGNOSIS — G8929 Other chronic pain: Secondary | ICD-10-CM | POA: Insufficient documentation

## 2011-09-17 IMAGING — CR DG KNEE COMPLETE 4+V*R*
4 series · 4 of 4 positions shown · non-contrast
Comparison: [DATE]

CLINICAL DATA: Chronic right knee pain

RIGHT KNEE - COMPLETE 4+ VIEW

[view not recorded (1 of 4)]
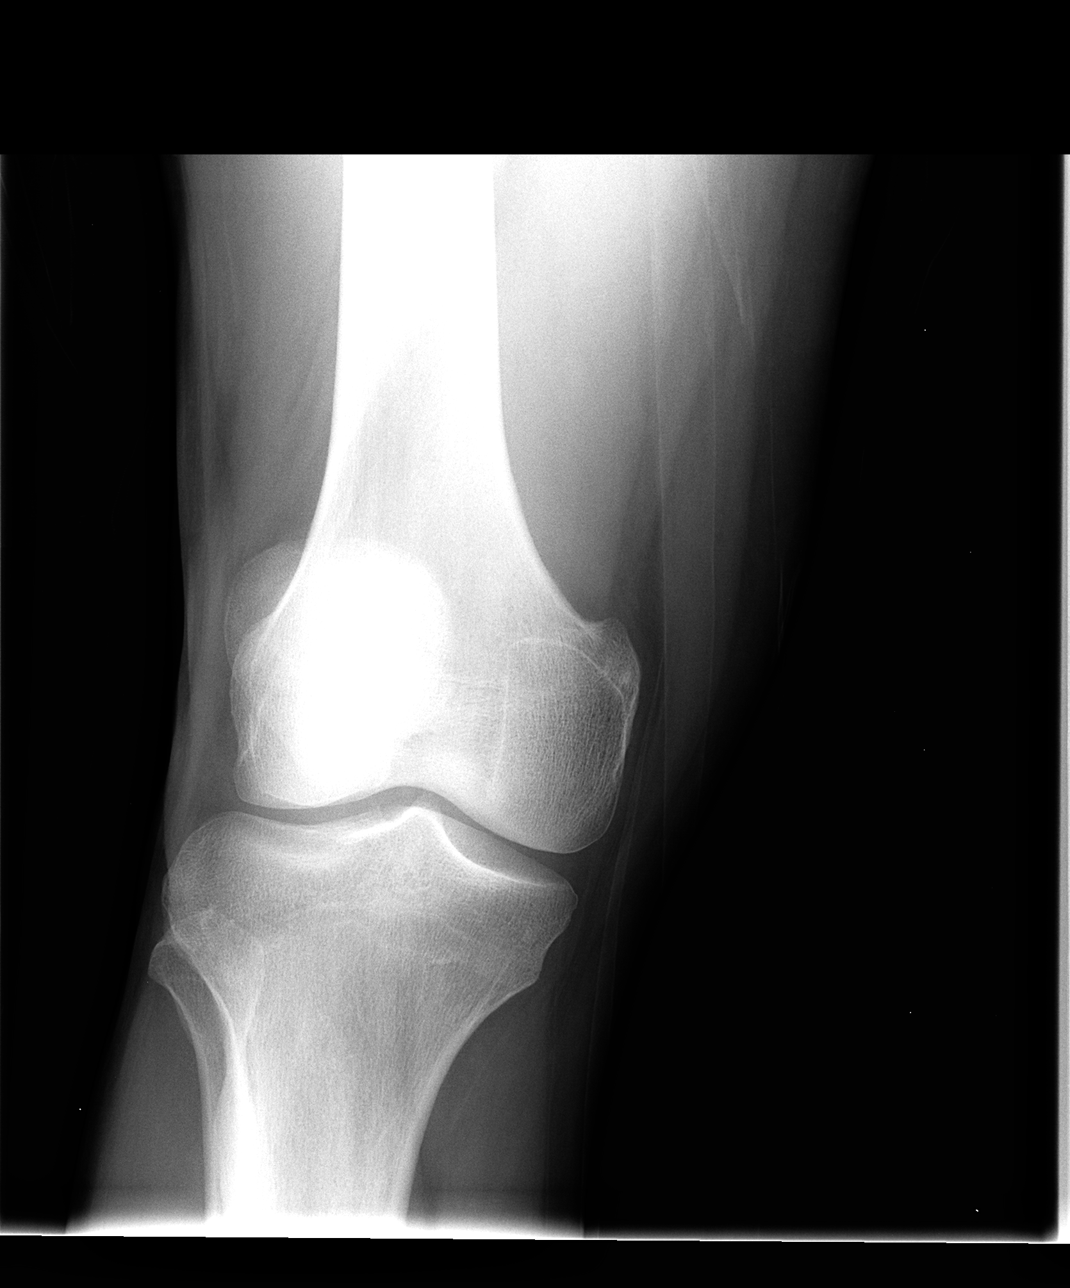

[view not recorded (2 of 4)]
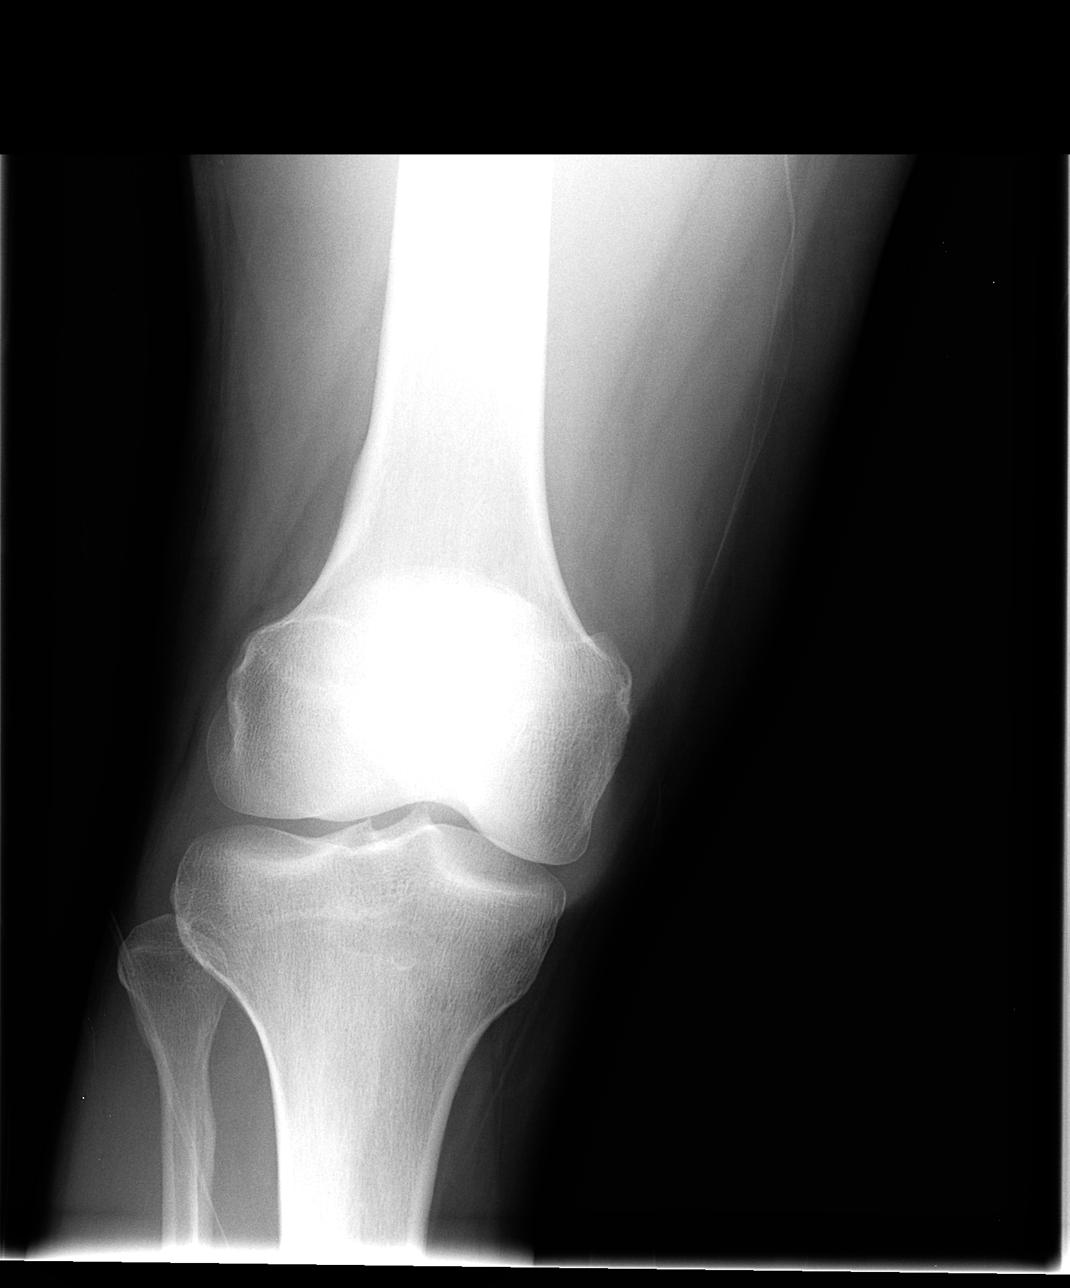

[view not recorded (3 of 4)]
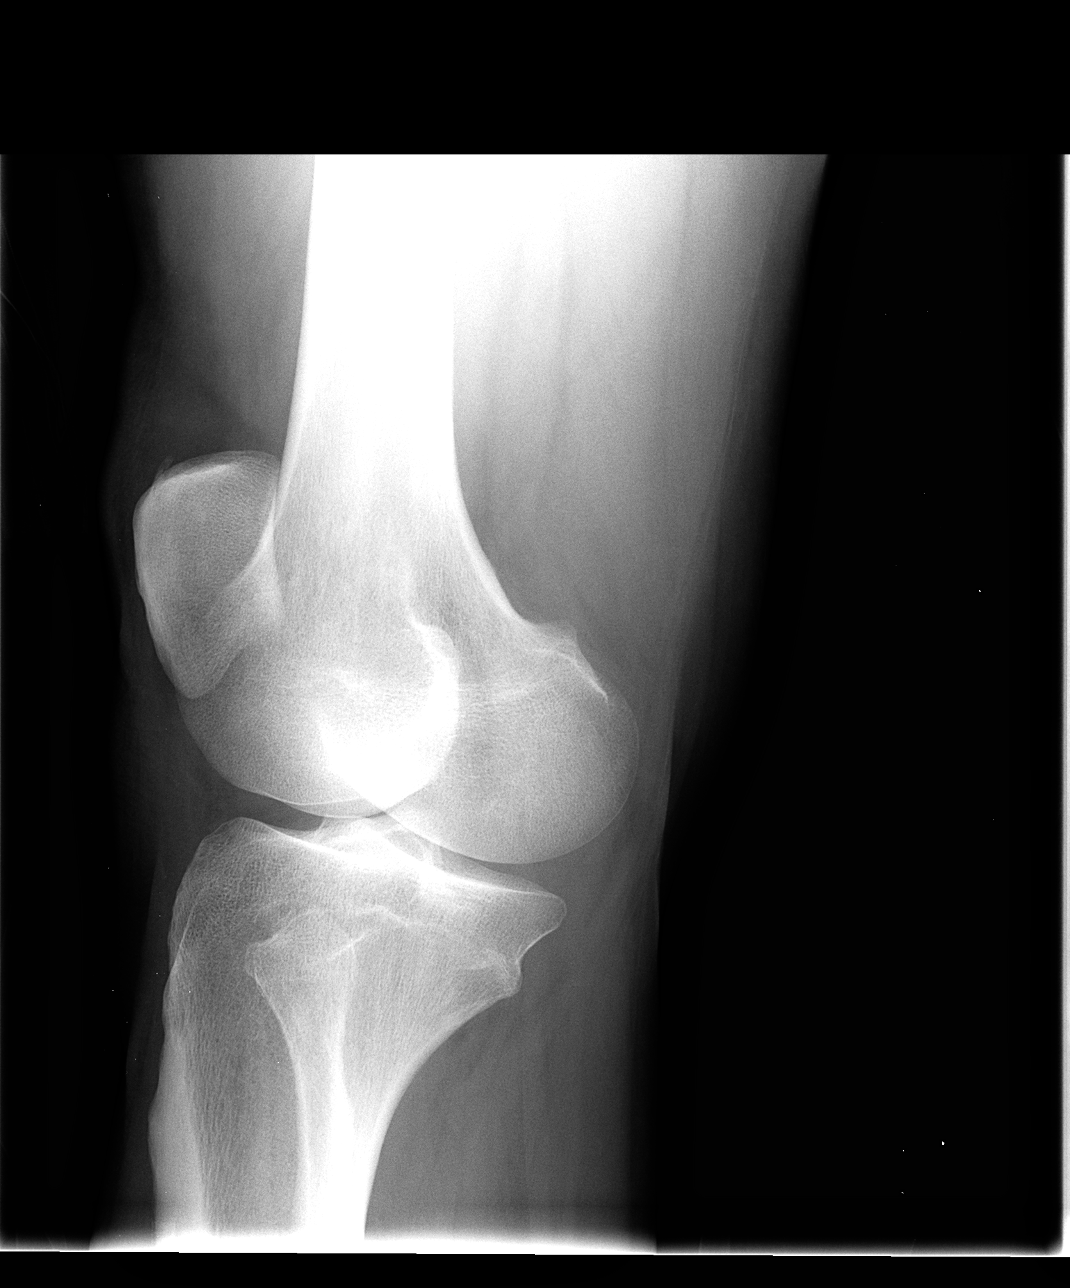

[view not recorded (4 of 4)]
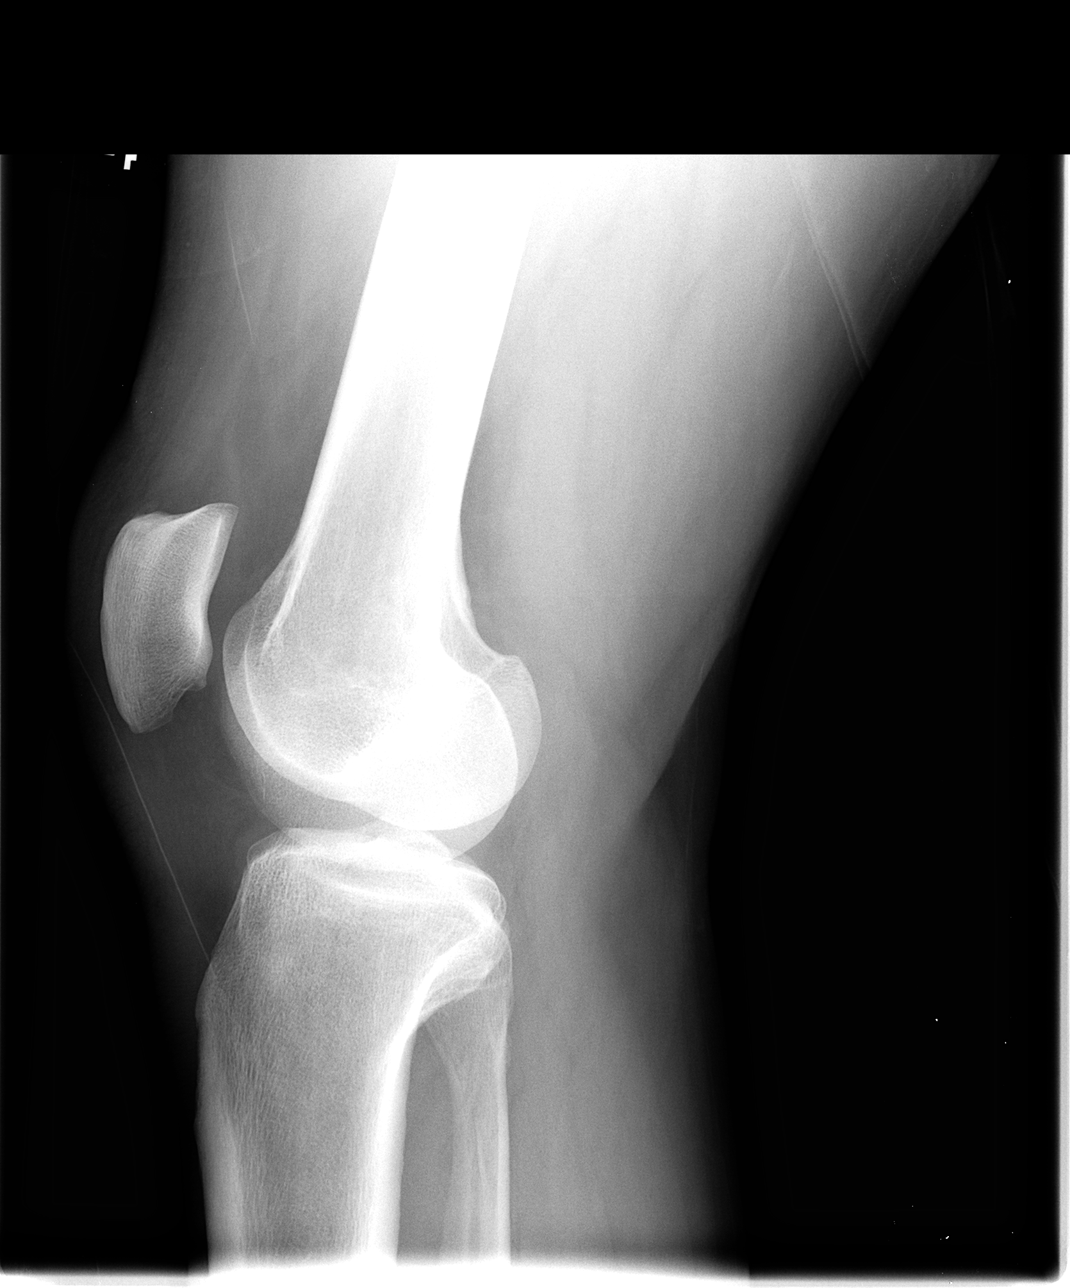

[4 of 4 positions shown; findings below may reference images not displayed]

FINDINGS: No fracture or dislocation.  No soft tissue abnormality.
No radiopaque foreign body.  No suprapatellar effusion.
IMPRESSION: Normal exam.

## 2011-09-17 MED ORDER — PREDNISONE 10 MG PO TABS: ORAL_TABLET | ORAL | Status: DC

## 2011-09-17 MED ORDER — HYDROCODONE-ACETAMINOPHEN 5-325 MG PO TABS
1.0000 | ORAL_TABLET | Freq: Once | ORAL | Status: AC
  Administered 2011-09-17: 1 via ORAL
  Filled 2011-09-17: qty 1

## 2011-09-17 MED ORDER — HYDROCODONE-ACETAMINOPHEN 5-325 MG PO TABS: 1.0000 | ORAL_TABLET | ORAL | Status: AC | PRN

## 2011-09-17 NOTE — ED Notes (Signed)
Pt a/ox4. Resp even and unlabored. NAD at this time. D/C instructions reviewed with pt. Pt verbalized understanding. Pt ambulated to POV with steady gate. Family with pt to transport home.
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
## 2011-09-18 NOTE — ED Provider Notes (Signed)
Medical screening examination/treatment/procedure(s) were performed by non-physician practitioner and as supervising physician I was immediately available for consultation/collaboration.  Raeford Razor, MD 09/18/11 [phone]

## 2012-12-09 ENCOUNTER — Emergency Department (HOSPITAL_COMMUNITY)
Admission: EM | Admit: 2012-12-09 | Discharge: 2012-12-09 | Disposition: A | Discharge status: Home / Self Care | Payer: Self-pay | Attending: Emergency Medicine | Admitting: Emergency Medicine

## 2012-12-09 ENCOUNTER — Emergency Department (HOSPITAL_COMMUNITY): Payer: Self-pay

## 2012-12-09 ENCOUNTER — Encounter (HOSPITAL_COMMUNITY): Payer: Self-pay | Admitting: *Deleted

## 2012-12-09 DIAGNOSIS — S0993XA Unspecified injury of face, initial encounter: Secondary | ICD-10-CM | POA: Insufficient documentation

## 2012-12-09 DIAGNOSIS — S79919A Unspecified injury of unspecified hip, initial encounter: Secondary | ICD-10-CM | POA: Insufficient documentation

## 2012-12-09 DIAGNOSIS — Y990 Civilian activity done for income or pay: Secondary | ICD-10-CM | POA: Insufficient documentation

## 2012-12-09 DIAGNOSIS — S4980XA Other specified injuries of shoulder and upper arm, unspecified arm, initial encounter: Secondary | ICD-10-CM | POA: Insufficient documentation

## 2012-12-09 DIAGNOSIS — W132XXA Fall from, out of or through roof, initial encounter: Secondary | ICD-10-CM

## 2012-12-09 DIAGNOSIS — R209 Unspecified disturbances of skin sensation: Secondary | ICD-10-CM | POA: Insufficient documentation

## 2012-12-09 DIAGNOSIS — W138XXA Fall from, out of or through other building or structure, initial encounter: Secondary | ICD-10-CM | POA: Insufficient documentation

## 2012-12-09 DIAGNOSIS — S46909A Unspecified injury of unspecified muscle, fascia and tendon at shoulder and upper arm level, unspecified arm, initial encounter: Secondary | ICD-10-CM | POA: Insufficient documentation

## 2012-12-09 DIAGNOSIS — R51 Headache: Secondary | ICD-10-CM | POA: Insufficient documentation

## 2012-12-09 DIAGNOSIS — S8990XA Unspecified injury of unspecified lower leg, initial encounter: Secondary | ICD-10-CM | POA: Insufficient documentation

## 2012-12-09 DIAGNOSIS — IMO0002 Reserved for concepts with insufficient information to code with codable children: Secondary | ICD-10-CM | POA: Insufficient documentation

## 2012-12-09 DIAGNOSIS — S79929A Unspecified injury of unspecified thigh, initial encounter: Secondary | ICD-10-CM | POA: Insufficient documentation

## 2012-12-09 DIAGNOSIS — F17200 Nicotine dependence, unspecified, uncomplicated: Secondary | ICD-10-CM | POA: Insufficient documentation

## 2012-12-09 DIAGNOSIS — Y939 Activity, unspecified: Secondary | ICD-10-CM | POA: Insufficient documentation

## 2012-12-09 DIAGNOSIS — M5380 Other specified dorsopathies, site unspecified: Secondary | ICD-10-CM | POA: Insufficient documentation

## 2012-12-09 DIAGNOSIS — I10 Essential (primary) hypertension: Secondary | ICD-10-CM | POA: Insufficient documentation

## 2012-12-09 DIAGNOSIS — S99929A Unspecified injury of unspecified foot, initial encounter: Secondary | ICD-10-CM | POA: Insufficient documentation

## 2012-12-09 DIAGNOSIS — H53149 Visual discomfort, unspecified: Secondary | ICD-10-CM | POA: Insufficient documentation

## 2012-12-09 DIAGNOSIS — M2578 Osteophyte, vertebrae: Secondary | ICD-10-CM

## 2012-12-09 DIAGNOSIS — Y9289 Other specified places as the place of occurrence of the external cause: Secondary | ICD-10-CM | POA: Insufficient documentation

## 2012-12-09 HISTORY — DX: Essential (primary) hypertension: I10

## 2012-12-09 IMAGING — CR DG FOOT COMPLETE 3+V*R*
3 series · 3 of 3 positions shown · non-contrast
Comparison: None.

CLINICAL DATA: Fell yesterday with right foot pain

RIGHT FOOT COMPLETE - 3+ VIEW

[view not recorded (1 of 3)]
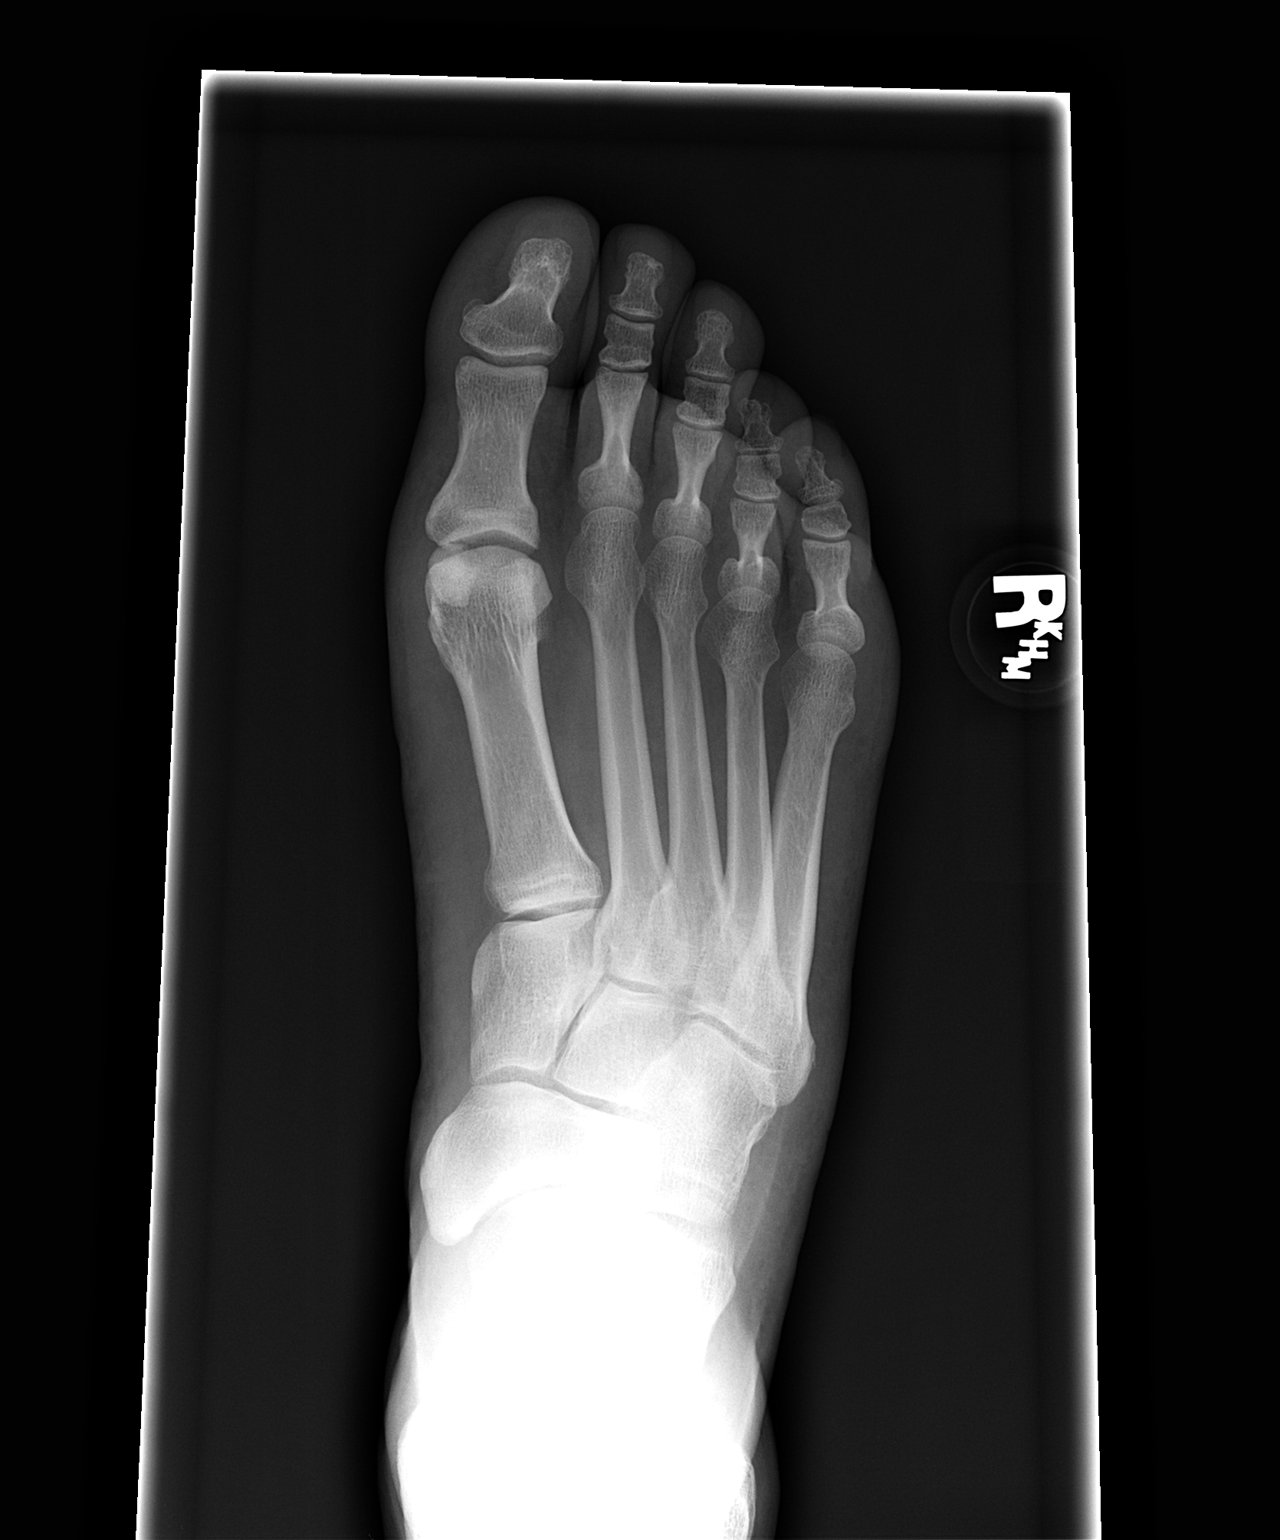

[view not recorded (2 of 3)]
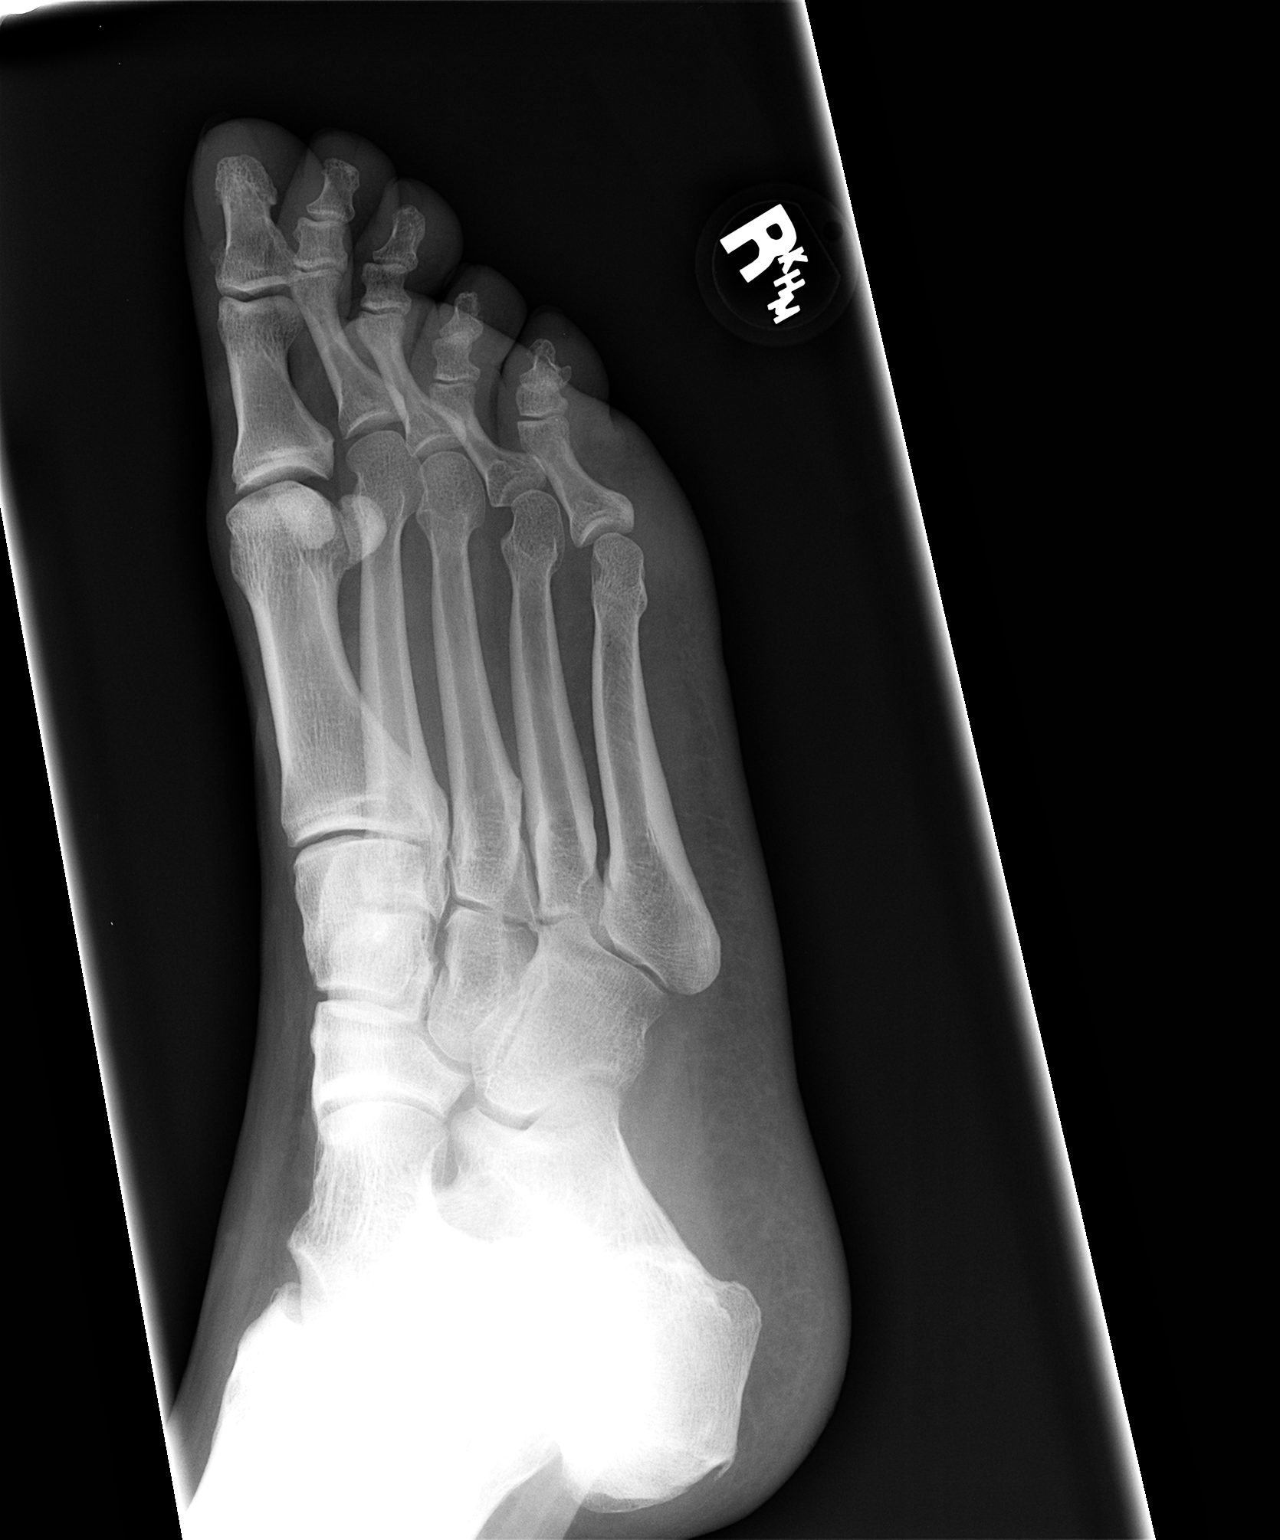

[view not recorded (3 of 3)]
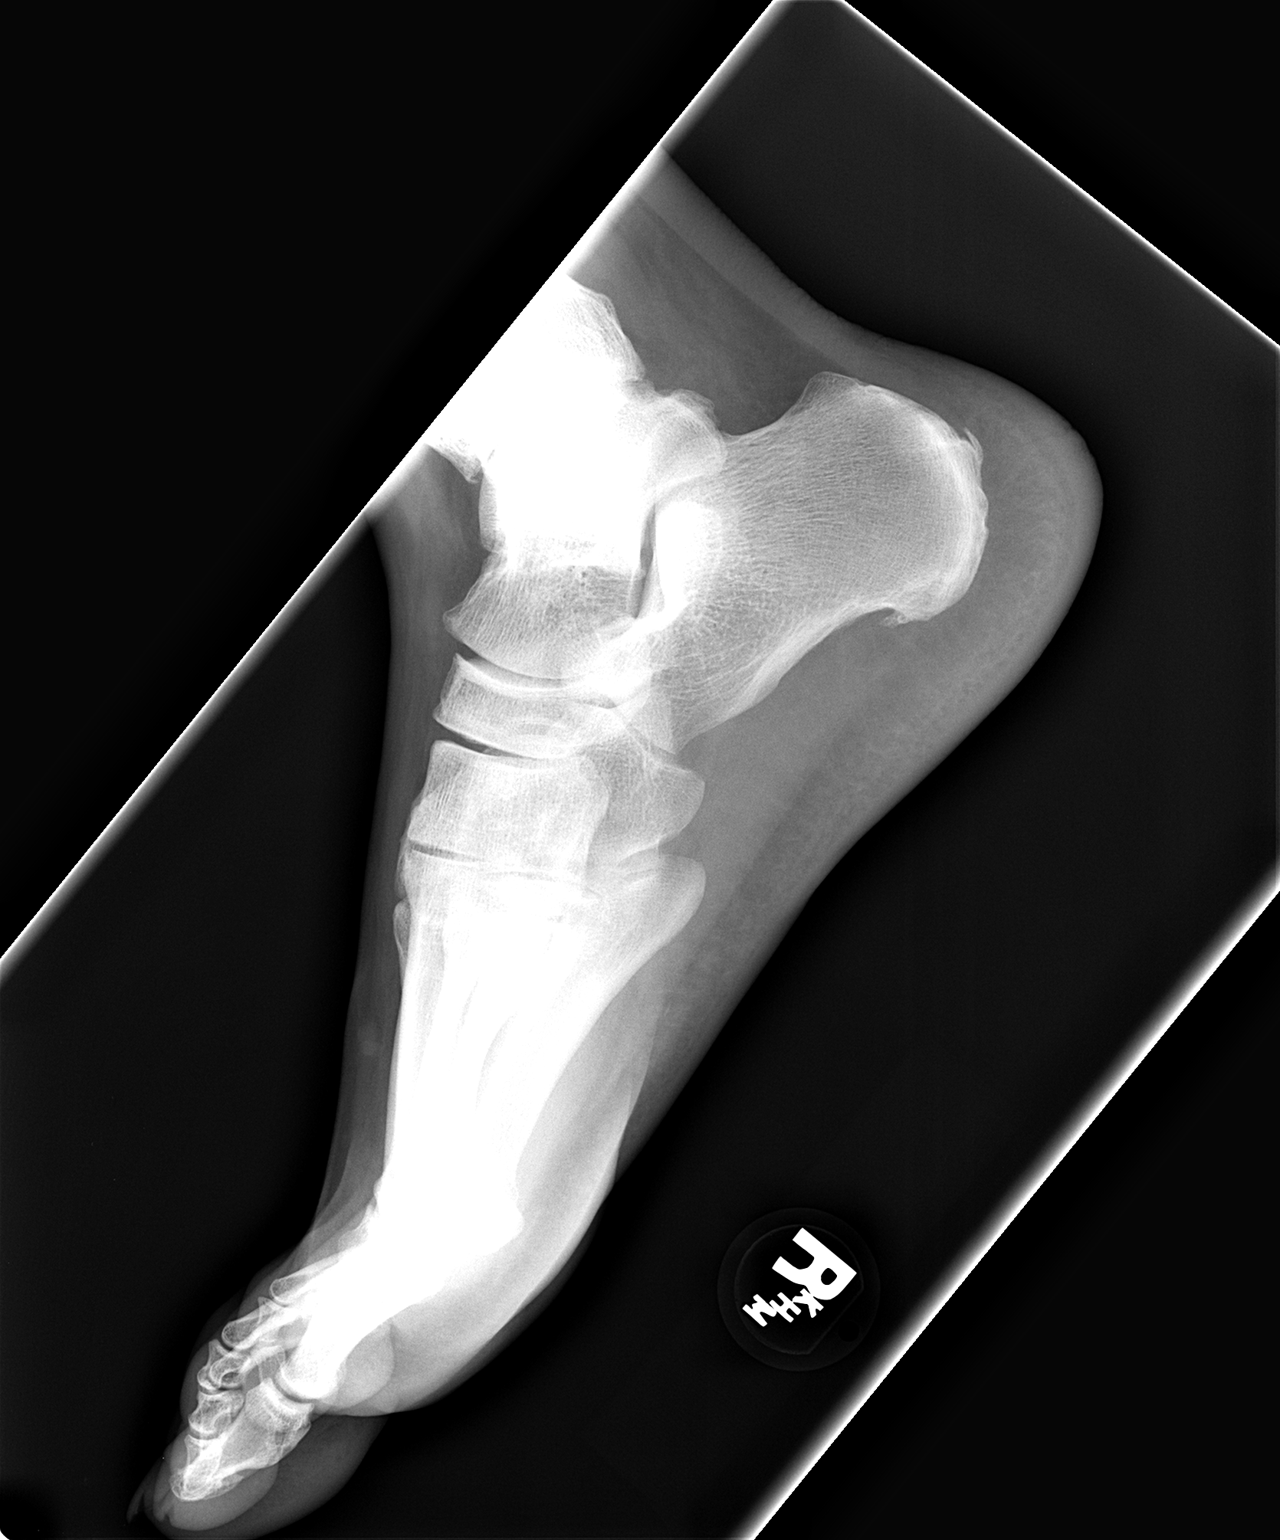

[3 of 3 positions shown; findings below may reference images not displayed]

FINDINGS: Tarsal - metatarsal alignment is normal.  Joint spaces
appear normal.  No fracture is seen.  Small calcaneal spurs are
noted.
IMPRESSION: No acute abnormality.  Small calcaneal degenerative spurs

## 2012-12-09 IMAGING — CR DG CERVICAL SPINE FLEX&EXT ONLY
2 series · 2 of 2 positions shown · non-contrast
Comparison: CT cervical spine of [DATE]

CLINICAL DATA: Neck pain

CERVICAL SPINE - FLEXION AND EXTENSION VIEWS ONLY

[view not recorded (1 of 2)]
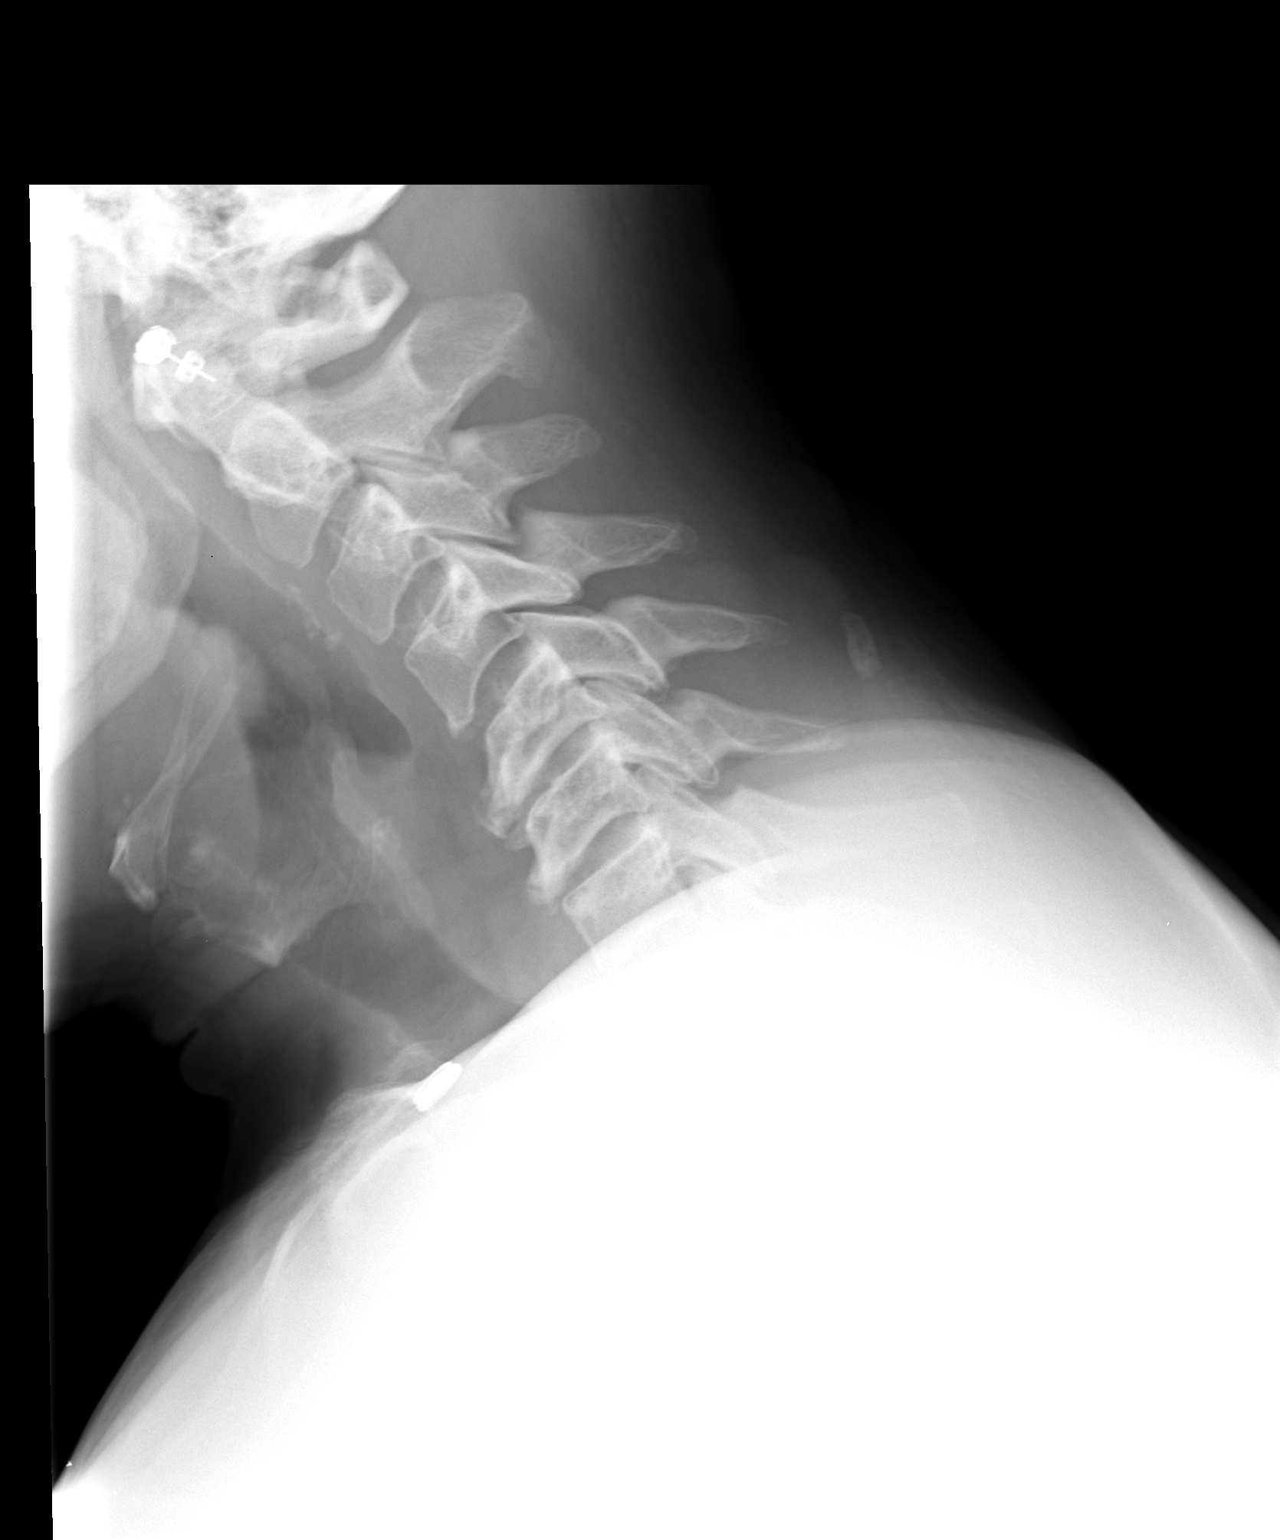

[view not recorded (2 of 2)]
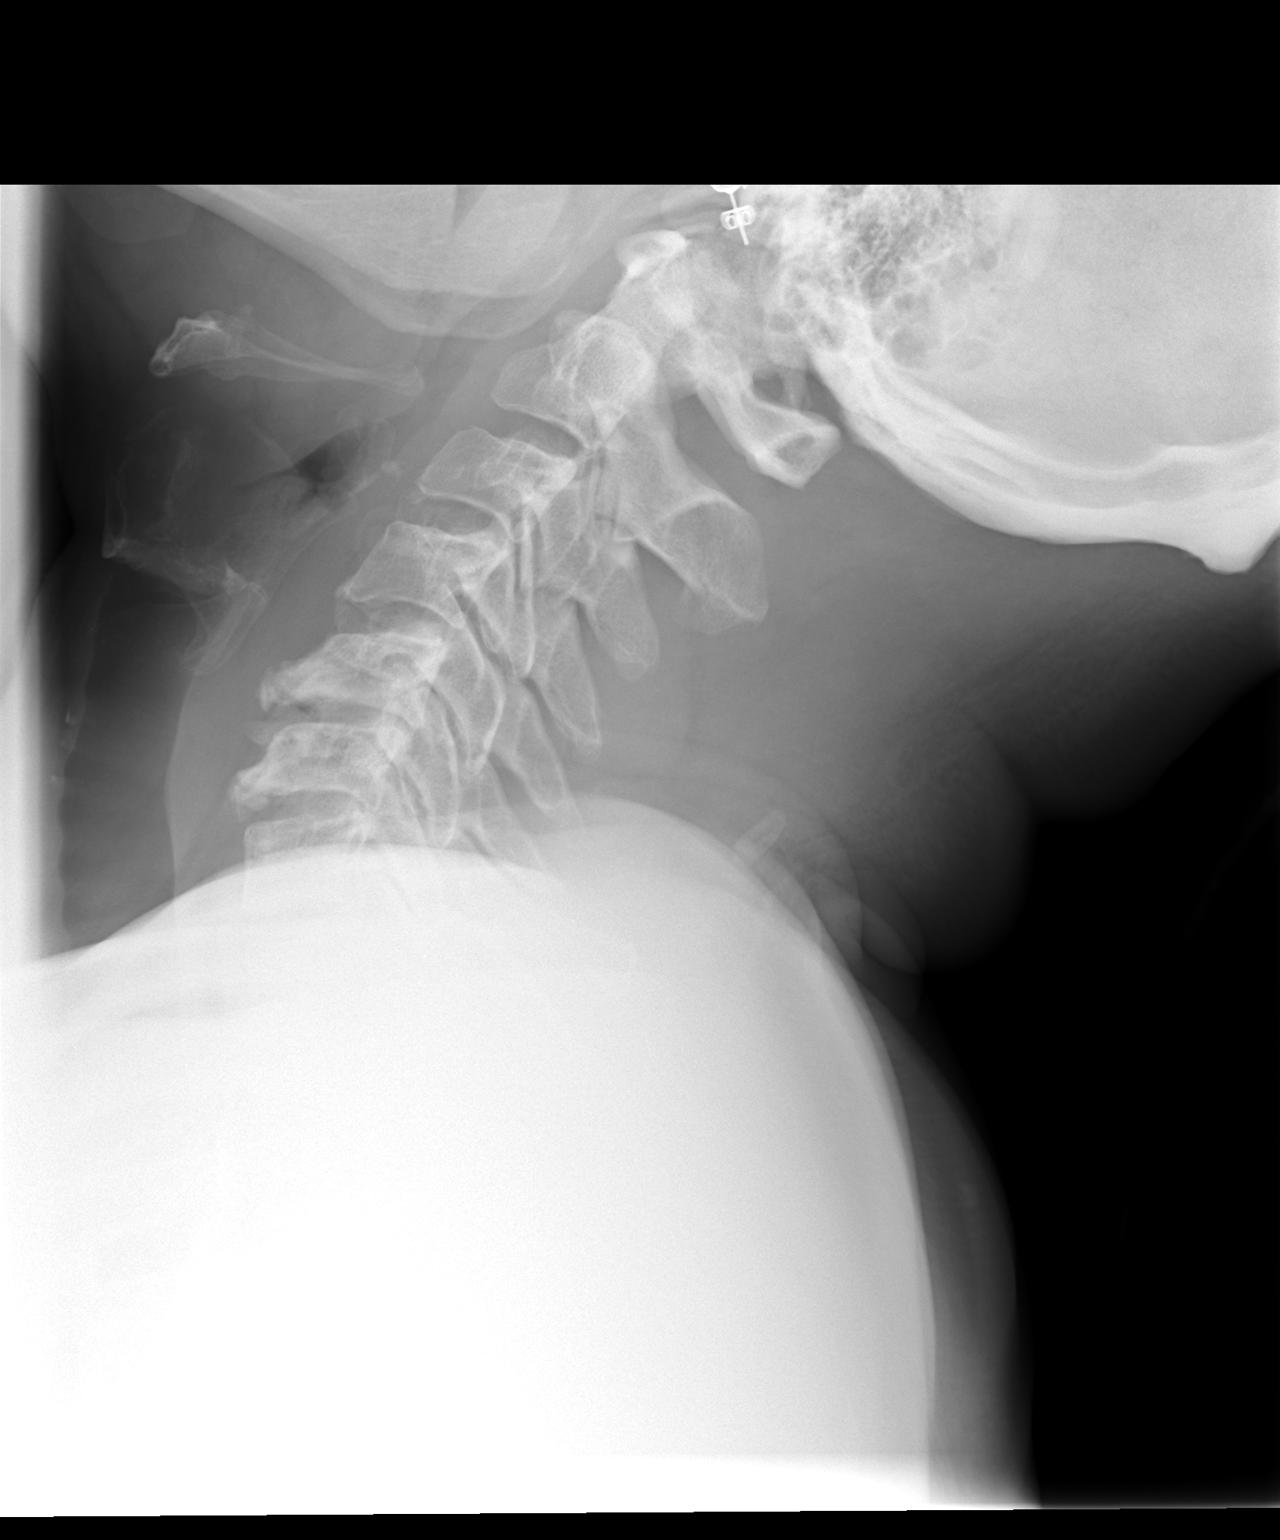

[2 of 2 positions shown; findings below may reference images not displayed]

FINDINGS: Through flexion and extension there is normal range of
motion of the cervical vertebrae with no malalignment.
Degenerative disc disease again is noted at C5-6 and C6-7 levels.
As noted on the CT of the cervical spine, there may be a fracture
of the anterior osteophyte of C5.  No significant prevertebral soft
tissue swelling is seen.
IMPRESSION: 1.  Normal range of motion through flexion and extension.  No
malalignment.
2.  Cannot exclude fracture of the anterior osteophyte emanating
from C5 as noted on CT of the cervical spine.

## 2012-12-09 IMAGING — CR DG LUMBAR SPINE 2-3V
5 series · 5 of 5 positions shown · non-contrast
Comparison: Lumbar spine films of [DATE]

CLINICAL DATA: Fell yesterday with back pain

LUMBAR SPINE - 2-3 VIEW

[view not recorded (1 of 5)]
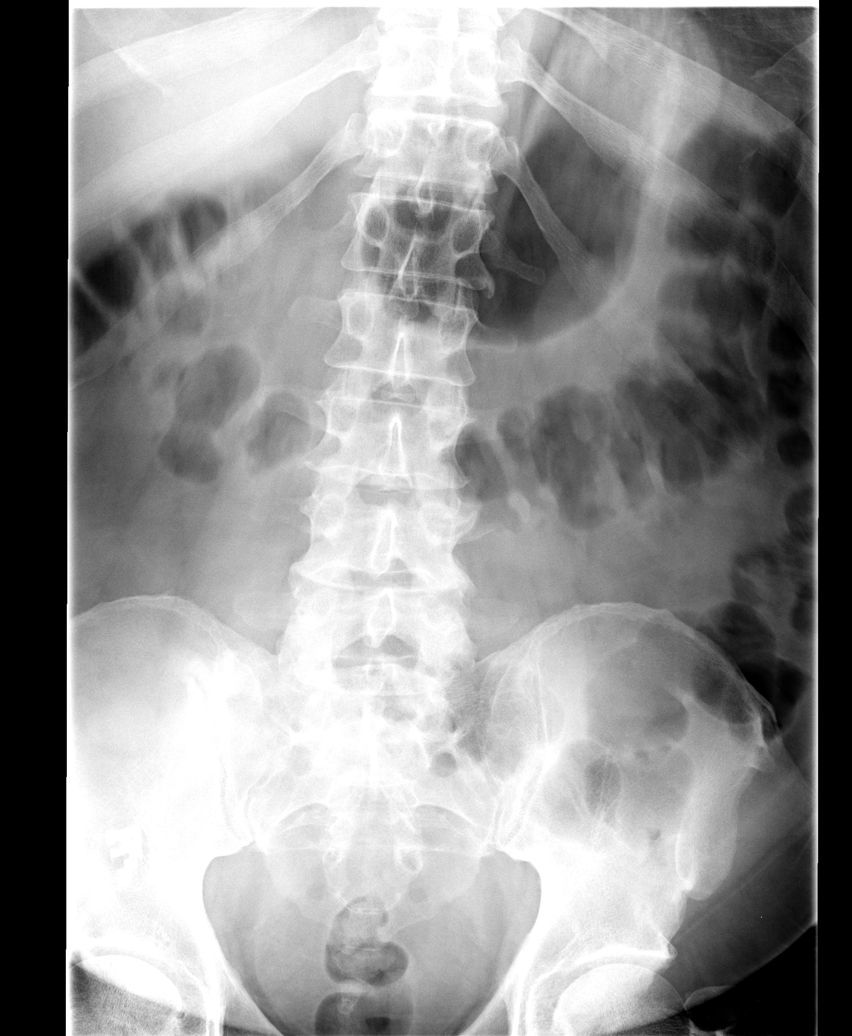

[view not recorded (2 of 5)]
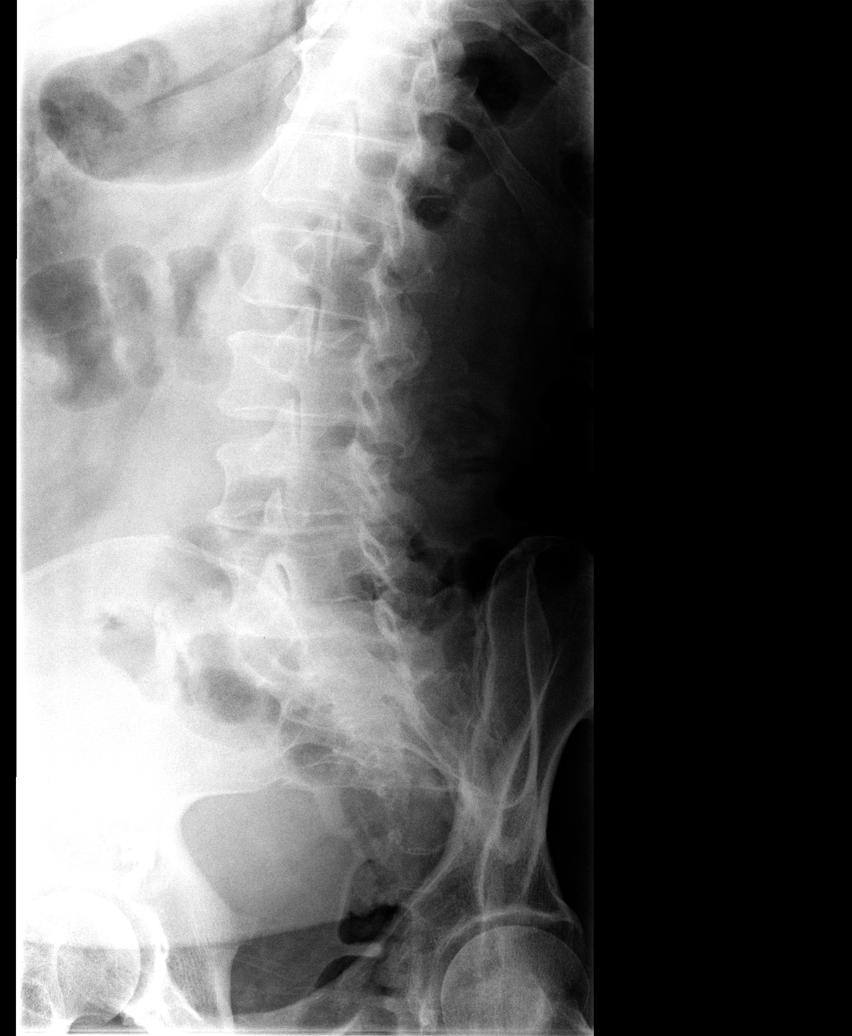

[view not recorded (3 of 5)]
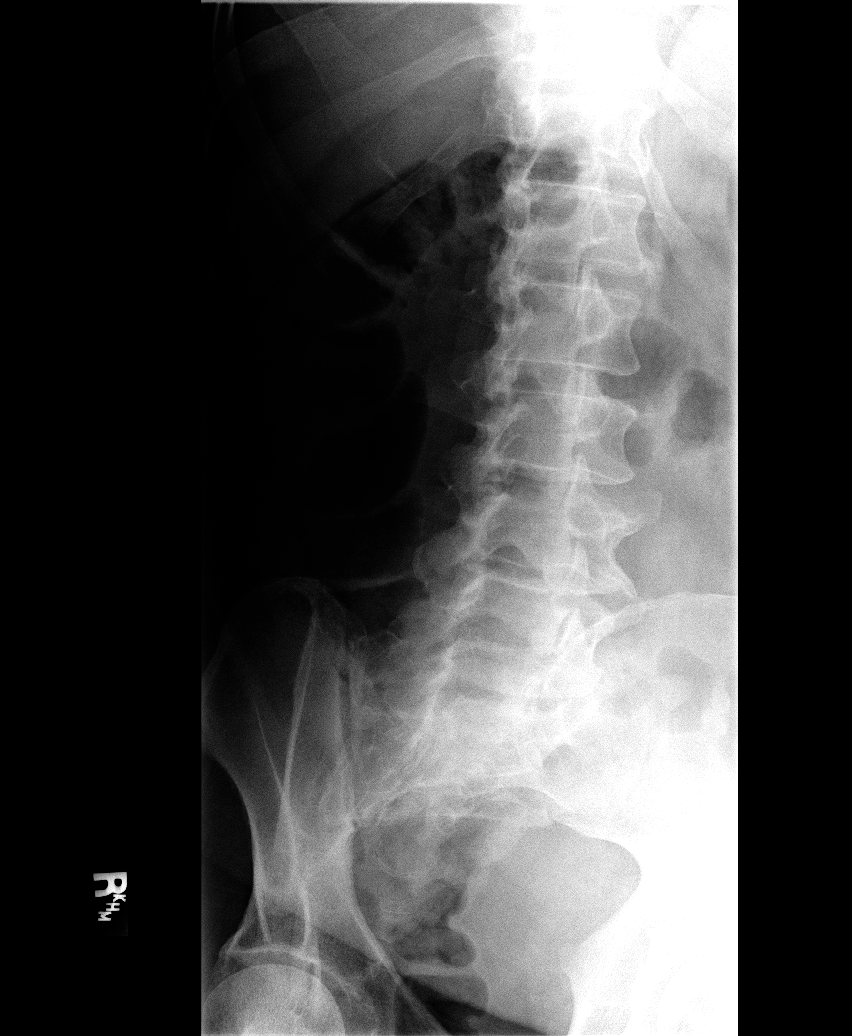

[view not recorded (4 of 5)]
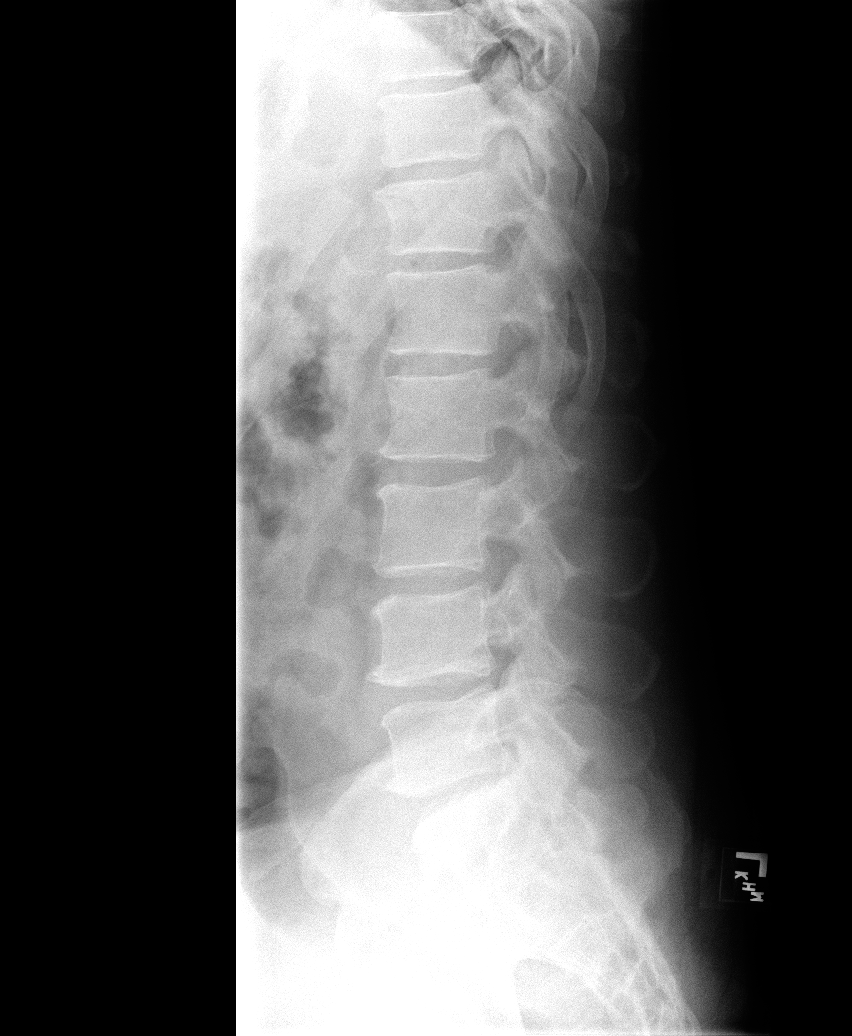

[view not recorded (5 of 5)]
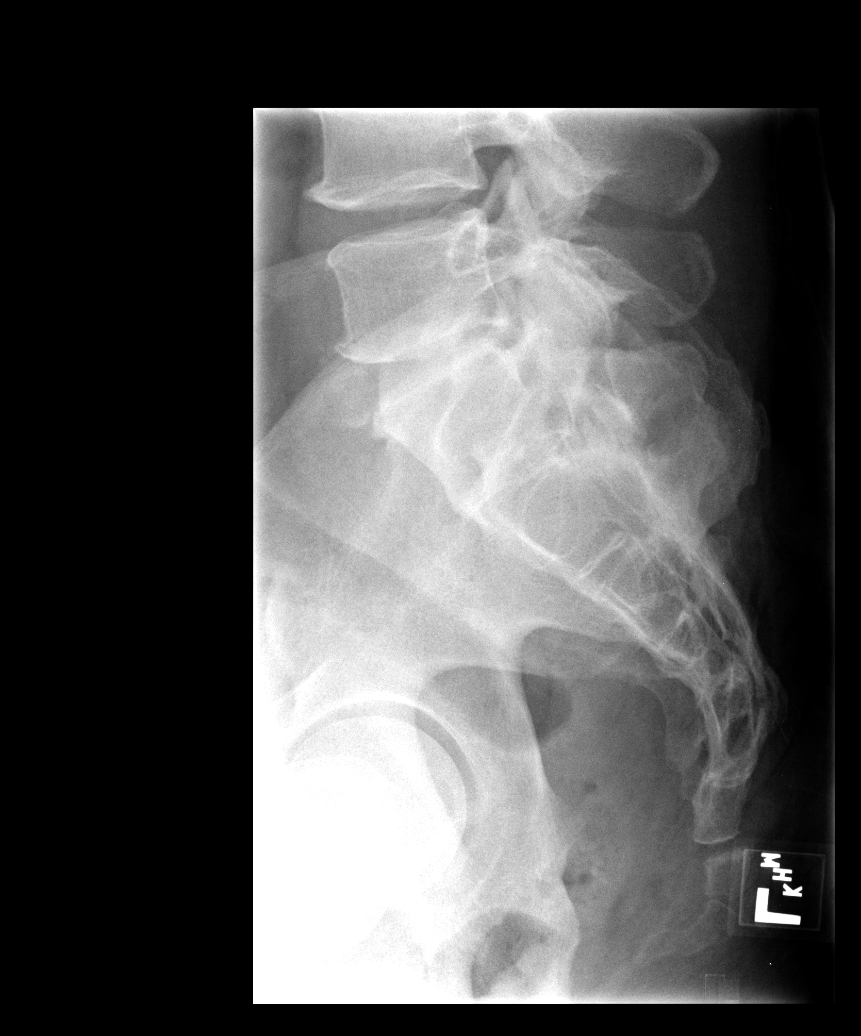

[5 of 5 positions shown; findings below may reference images not displayed]

FINDINGS: The lumbar vertebrae are in normal alignment.  No
compression deformity is seen.  Mild anterior osteophyte formation
is noted diffusely.  The SI joints are unremarkable.
IMPRESSION: Normal alignment.  No compression deformity.

## 2012-12-09 IMAGING — CR DG TIBIA/FIBULA 2V*R*
2 series · 2 of 2 positions shown · non-contrast
Comparison: None.

CLINICAL DATA: Fell yesterday with right leg pain

RIGHT TIBIA AND FIBULA - 2 VIEW

[view not recorded (1 of 2)]
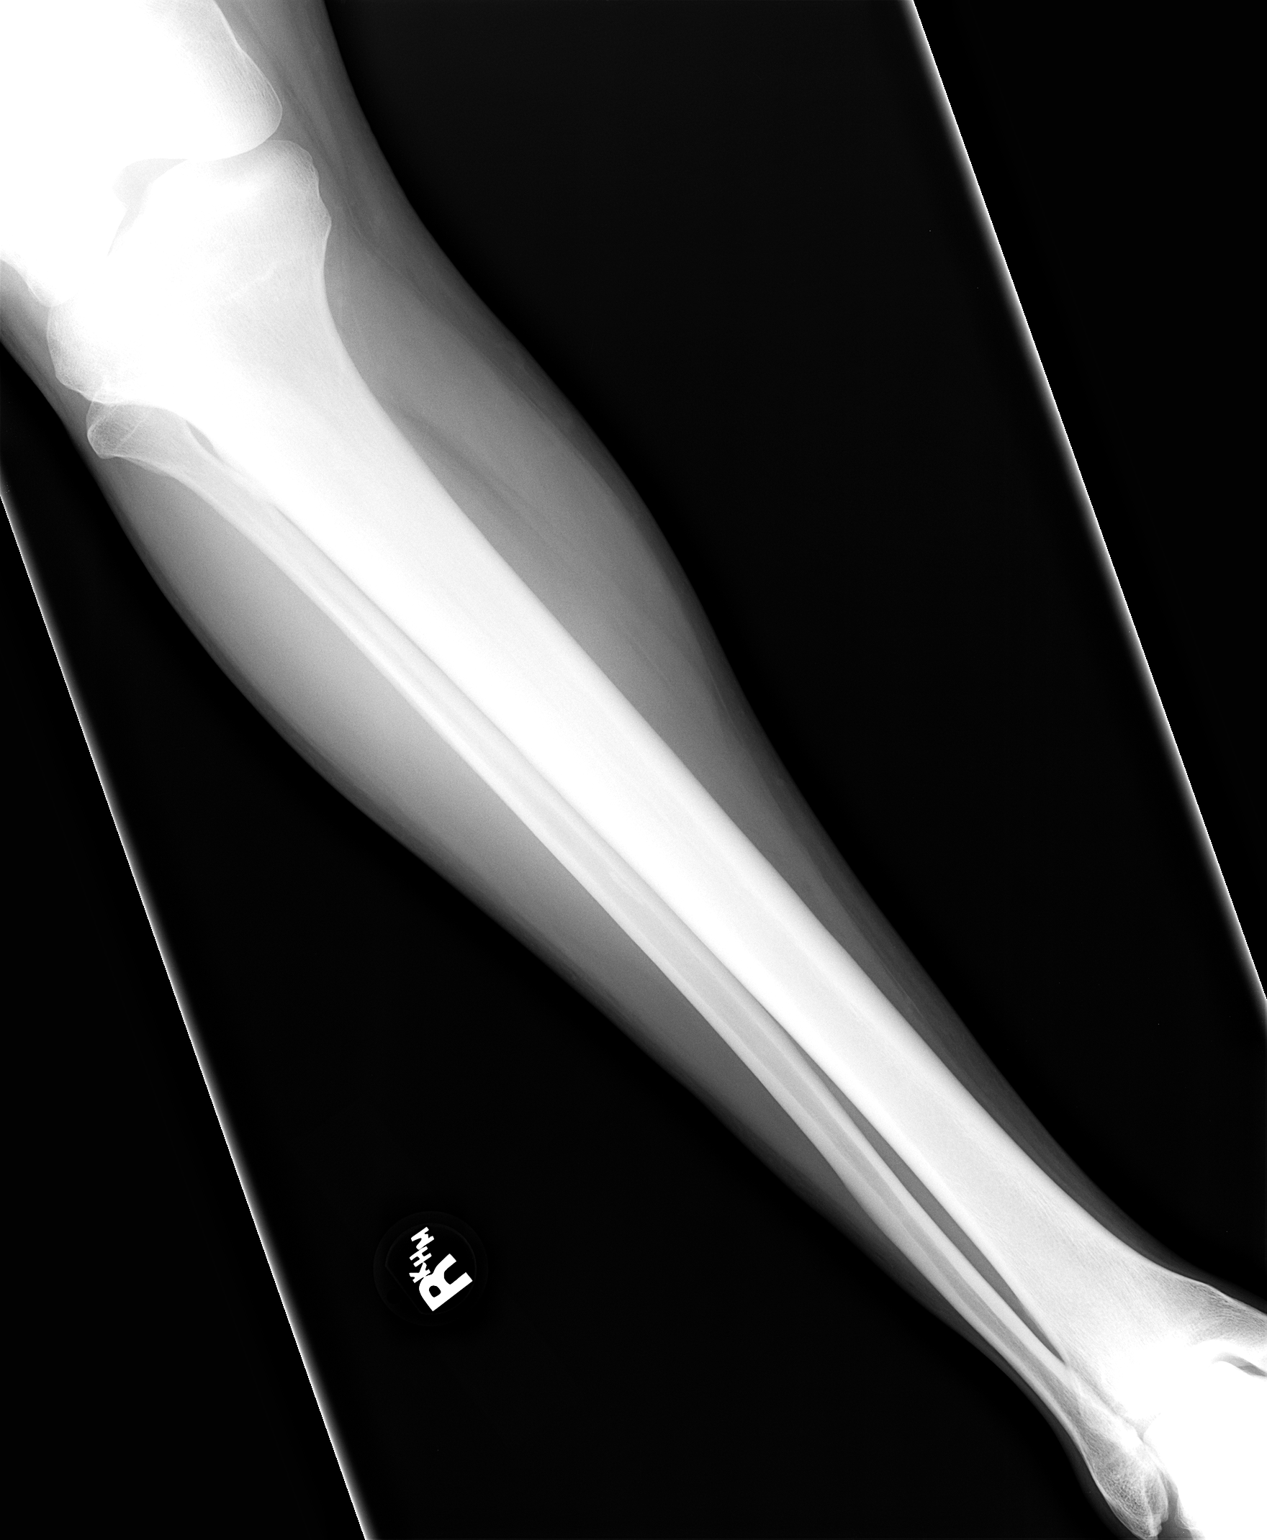

[view not recorded (2 of 2)]
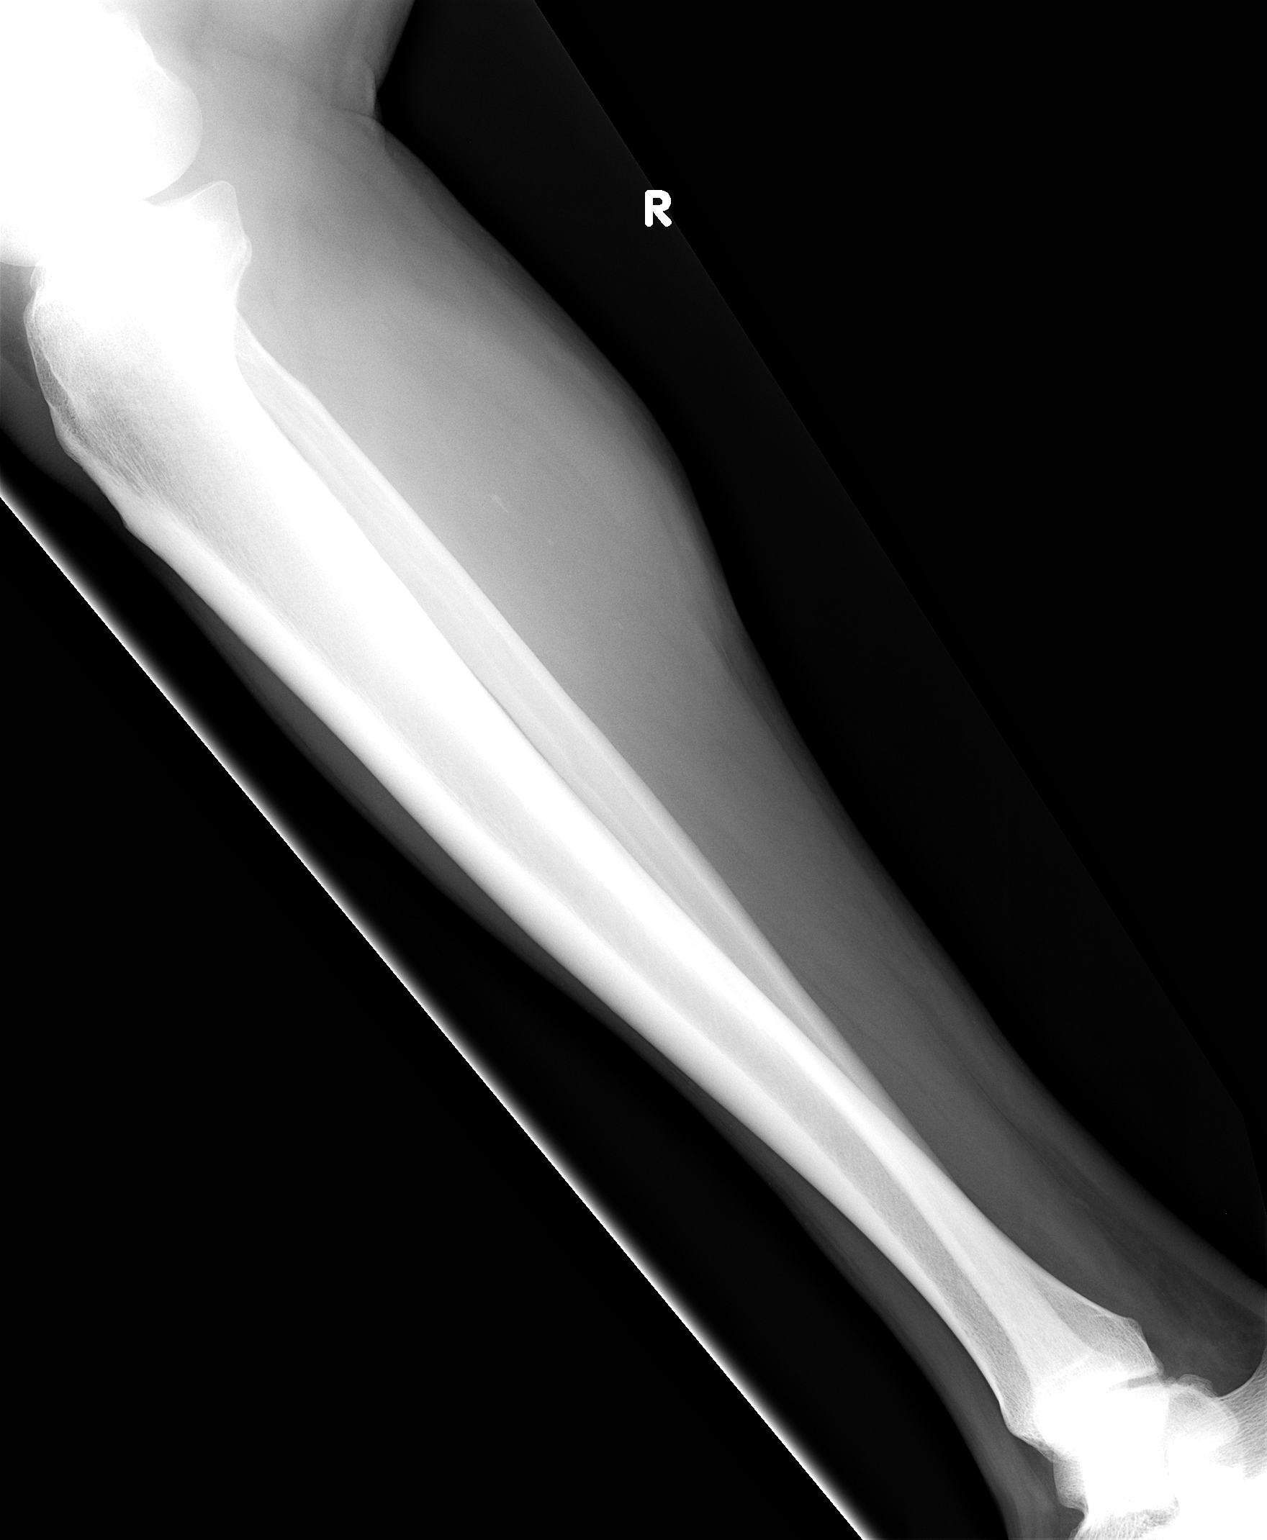

[2 of 2 positions shown; findings below may reference images not displayed]

FINDINGS: The right tibia and fibula are in normal alignment.  No
acute fracture is seen.  No soft tissue abnormality is noted.
IMPRESSION: Negative right tibia and fibula.

## 2012-12-09 IMAGING — CR DG THORACIC SPINE 2V
3 series · 3 of 3 positions shown · non-contrast
Comparison: None.

CLINICAL DATA: Fell yesterday with back pain

THORACIC SPINE - 2 VIEW

[view not recorded (1 of 3)]
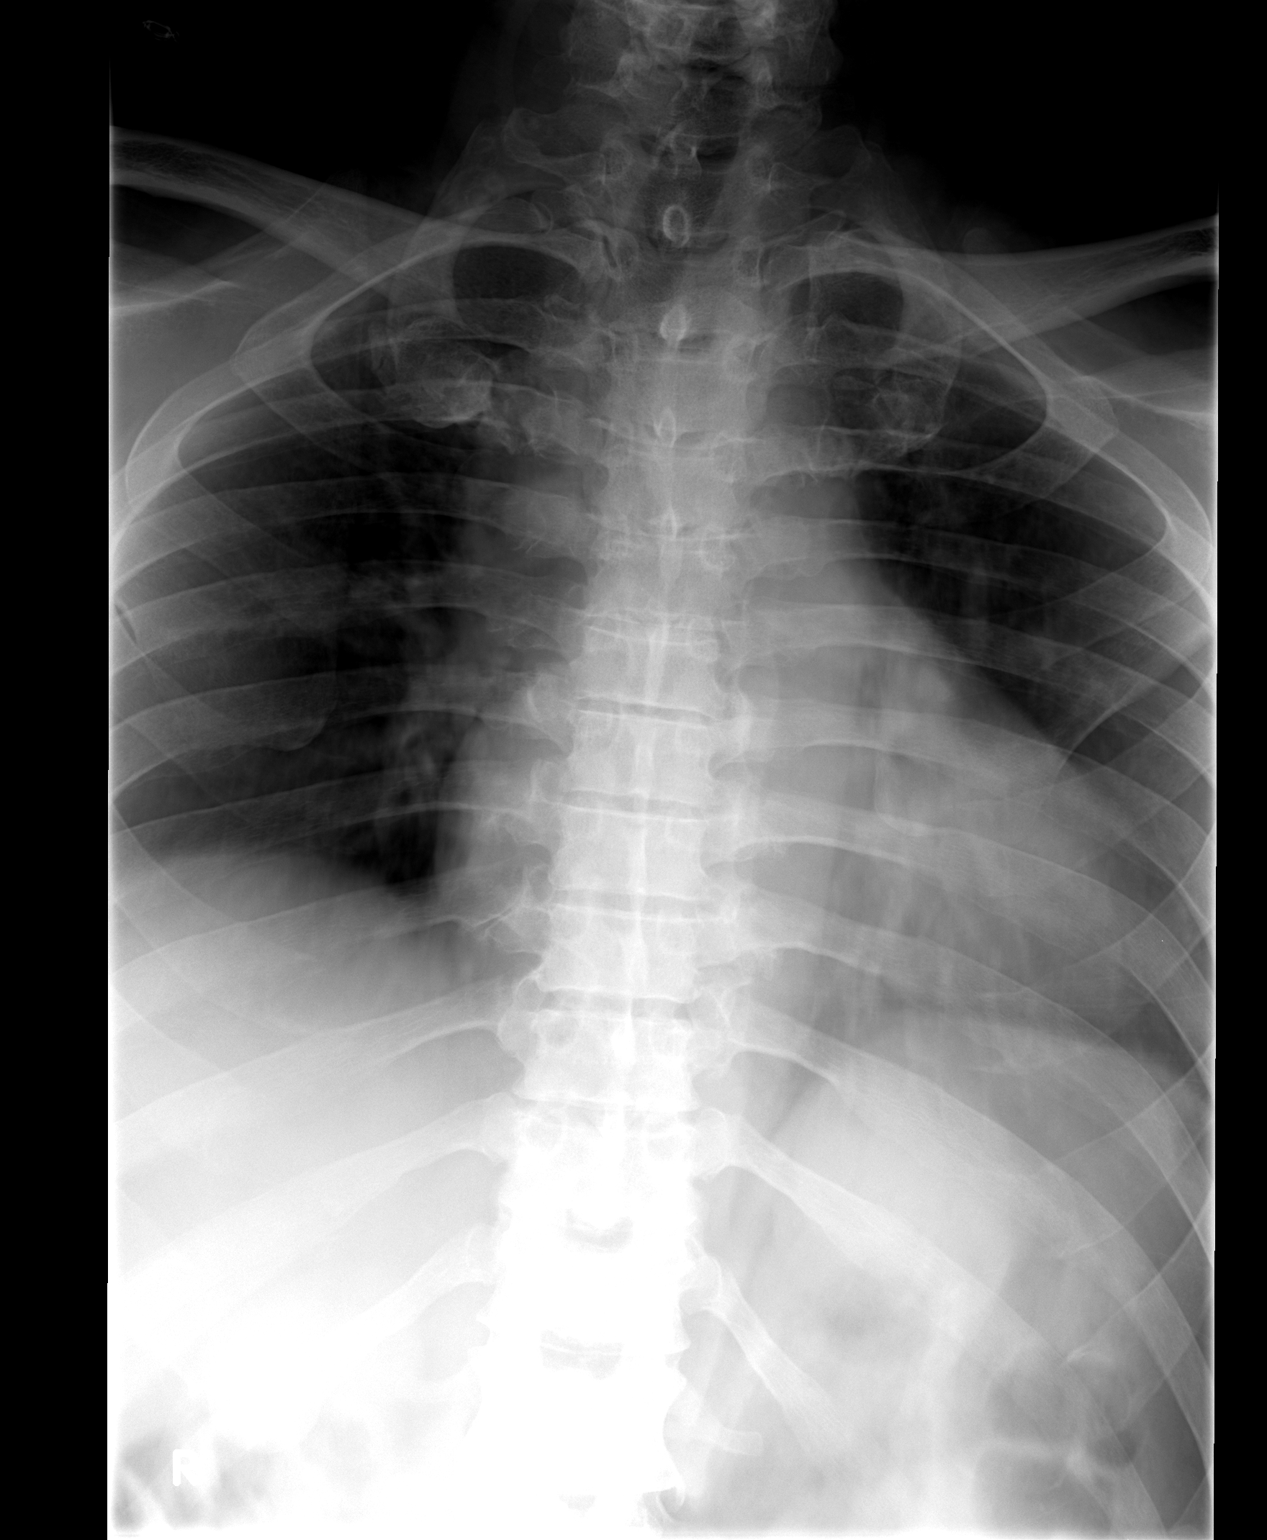

[view not recorded (2 of 3)]
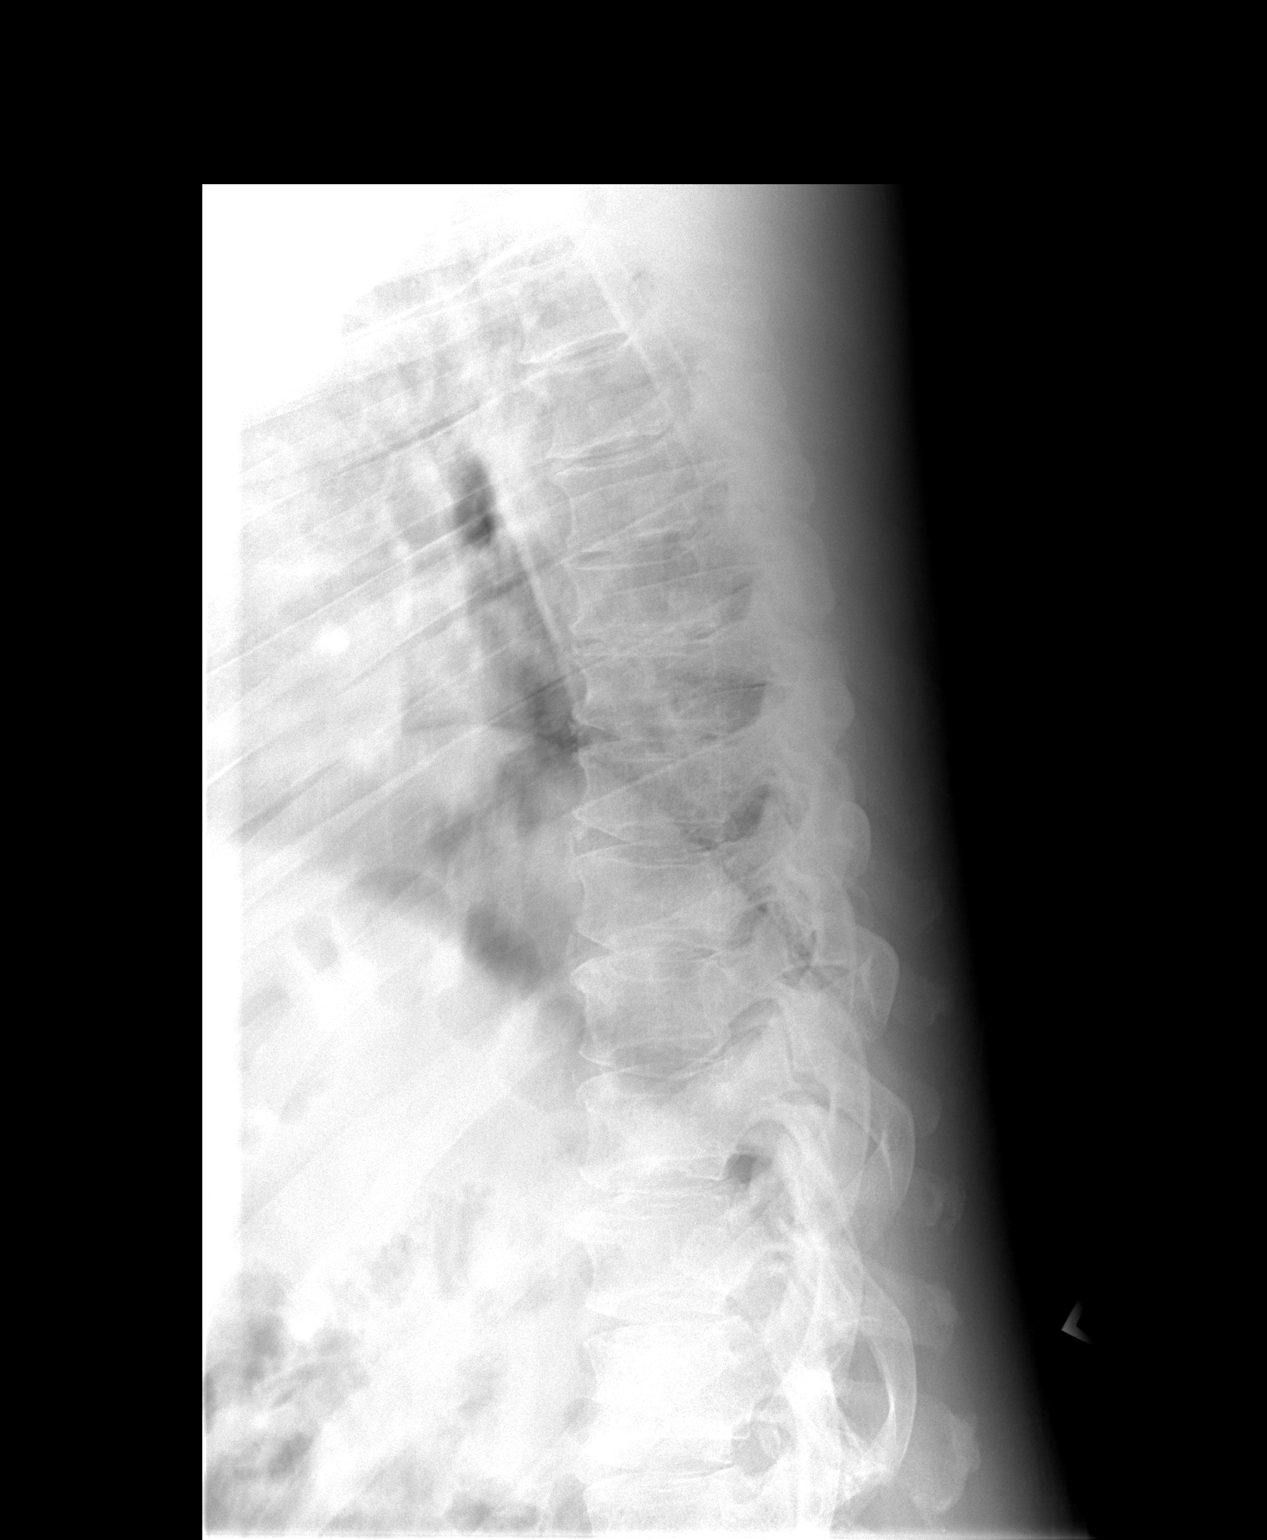

[view not recorded (3 of 3)]
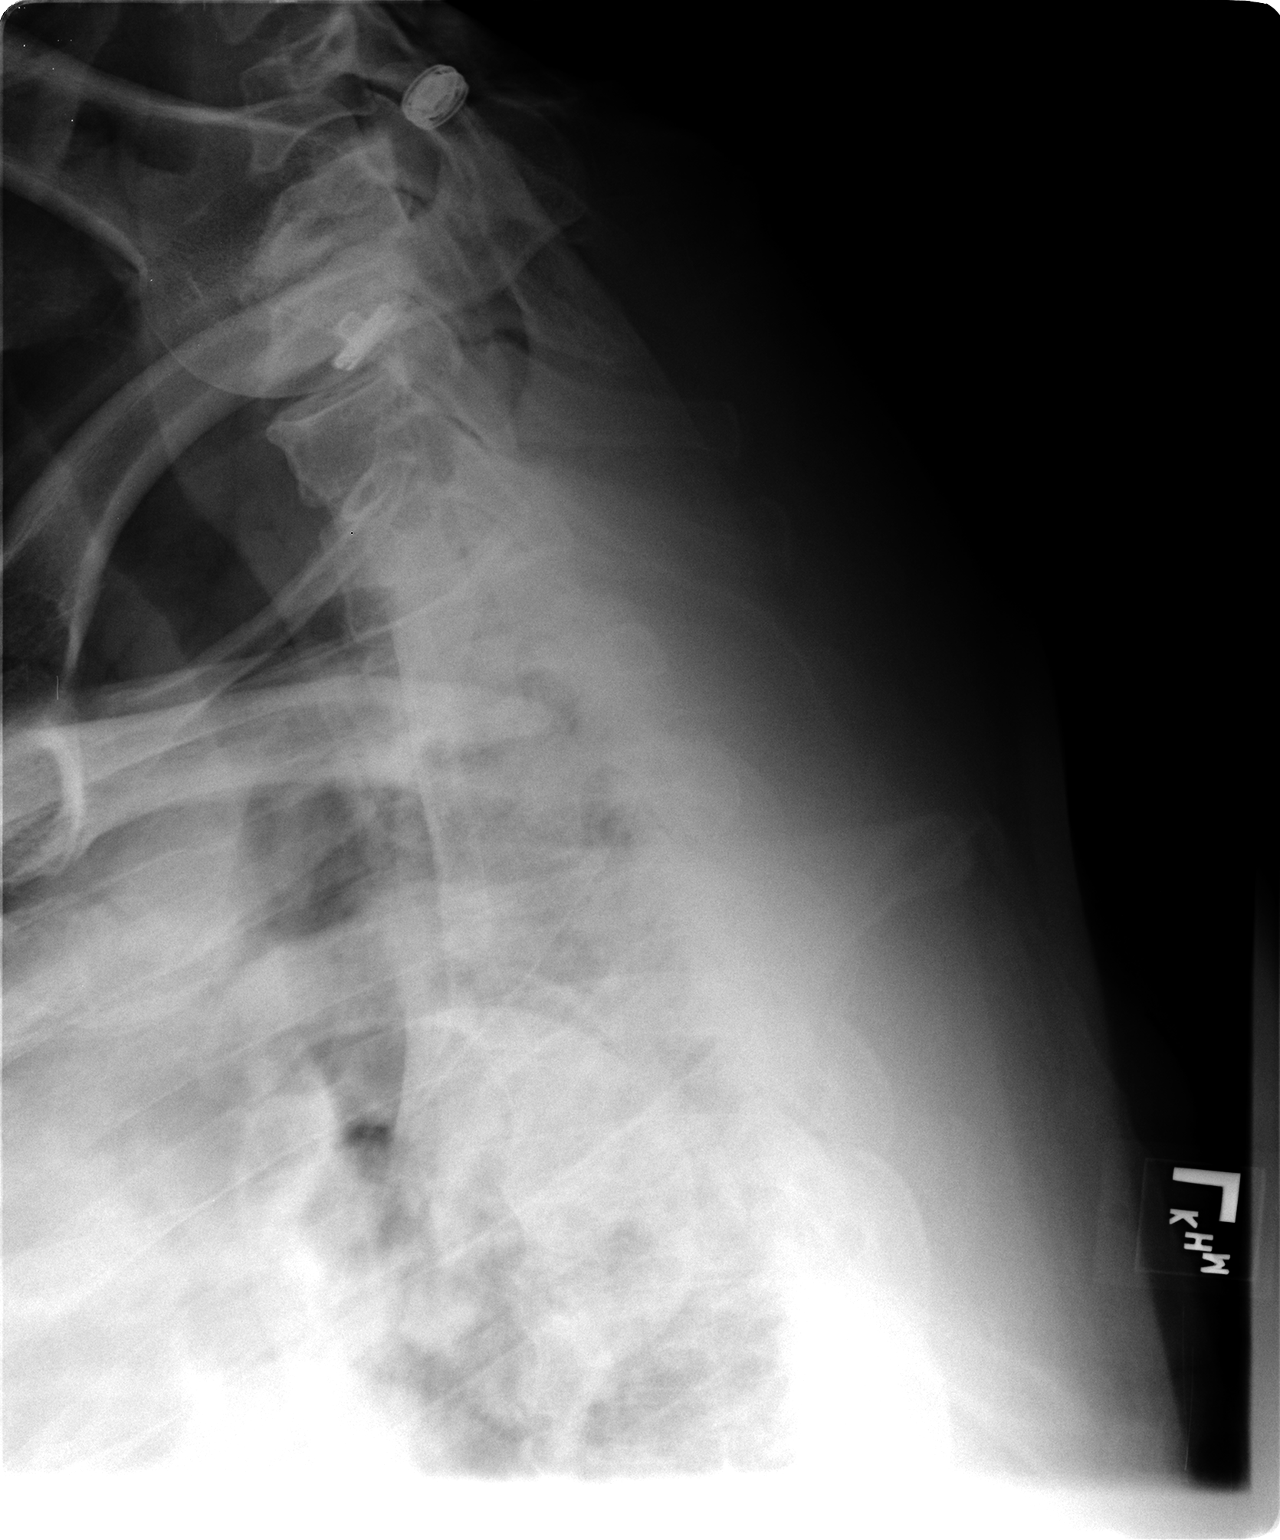

[3 of 3 positions shown; findings below may reference images not displayed]

FINDINGS: The thoracic vertebrae are in normal alignment.  There is
mild degenerative spurring present but no compression deformity is
noted.  No paravertebral soft tissue swelling is seen.
IMPRESSION: Mild degenerative change.  No acute compression deformity.

## 2012-12-09 IMAGING — CT CT HEAD W/O CM
4 of 5 series · 14 of 47 positions shown, 15 images · non-contrast
Comparison: Cervical spine films [DATE], and head CT
[DATE]

CT HEAD

CLINICAL DATA: Fall from height

CT HEAD WITHOUT CONTRAST
CT CERVICAL SPINE WITHOUT CONTRAST
TECHNIQUE: Multidetector CT imaging of the head and cervical spine
was performed following the standard protocol without intravenous
contrast.  Multiplanar CT image reconstructions of the cervical
spine were also generated.

[Series 2: headseq 4.8 h37s · axial · 0.50mm/px · z∈[+311,+405]mm · 3 of 36 slices shown, 4 images]
[im 9/36  brain]
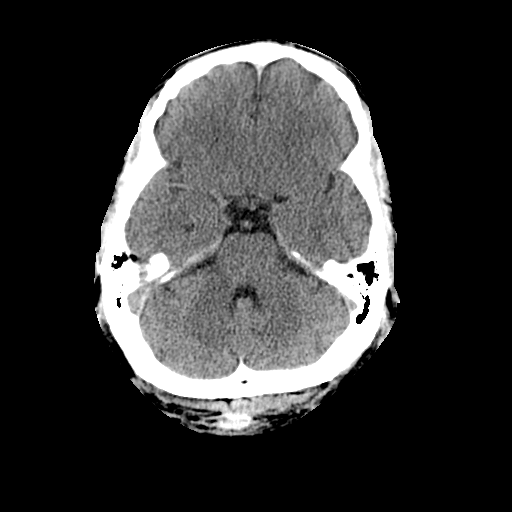
[im 9/36  bone]
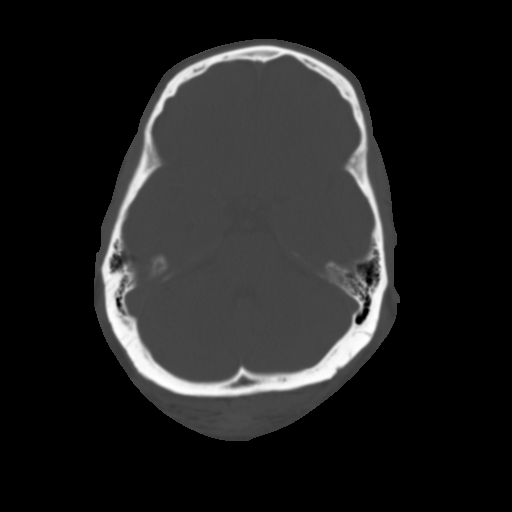
[im 18/36  brain]
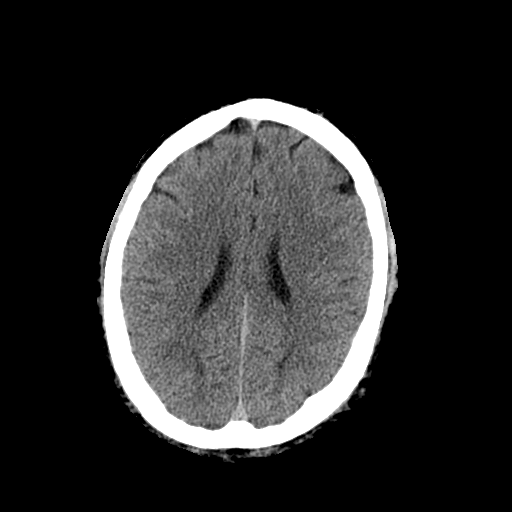
[im 27/36  brain]
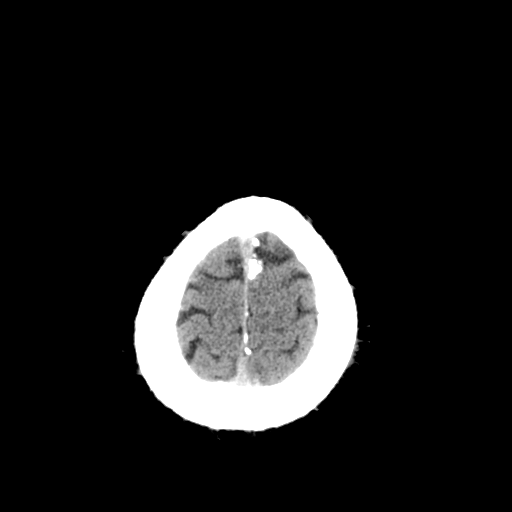

[Series 8: sagittal bone 2.0 · sagittal · 0.26mm/px · 3 of 43 slices shown]
[im 15/43  brain]
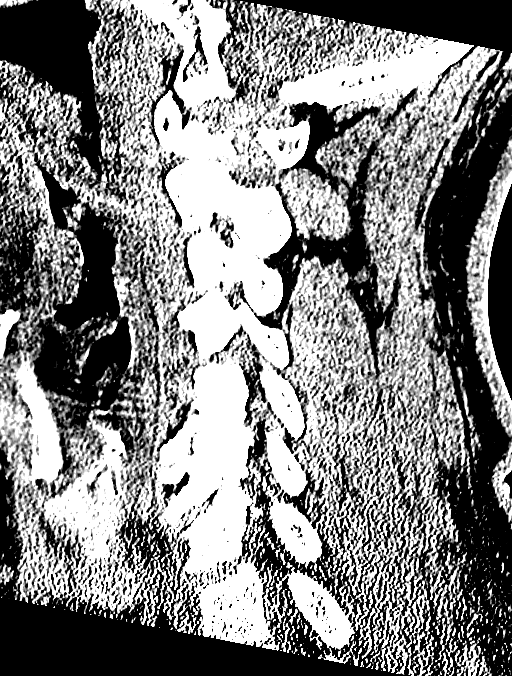
[im 22/43  brain]
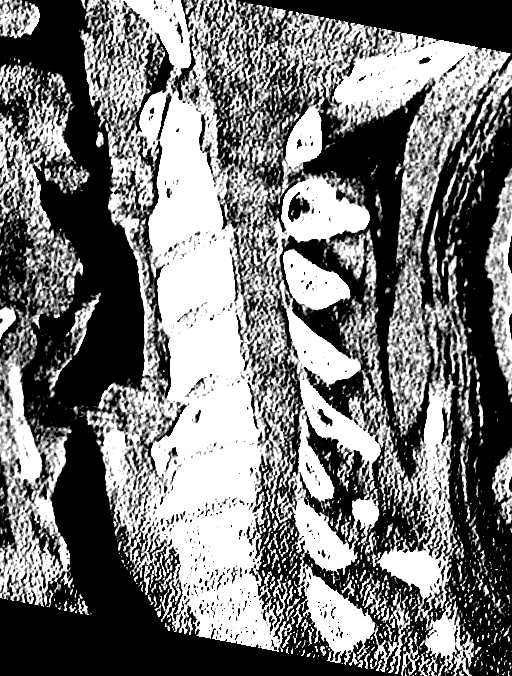
[im 29/43  brain]
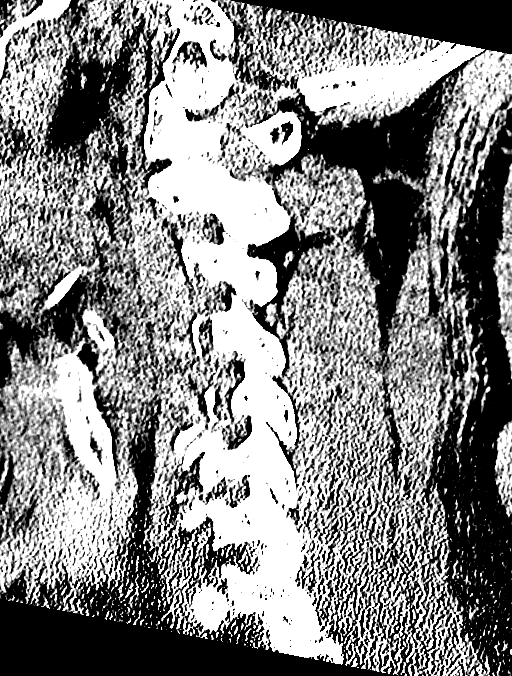

[Series 9: coronal bone 2.0 · coronal · 0.21mm/px · 3 of 53 slices shown]
[im 18/53  brain]
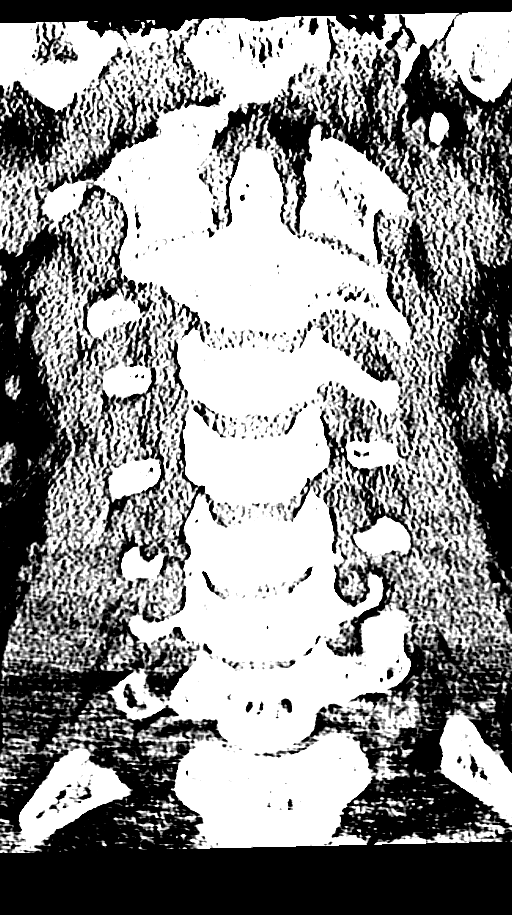
[im 24/53  brain]
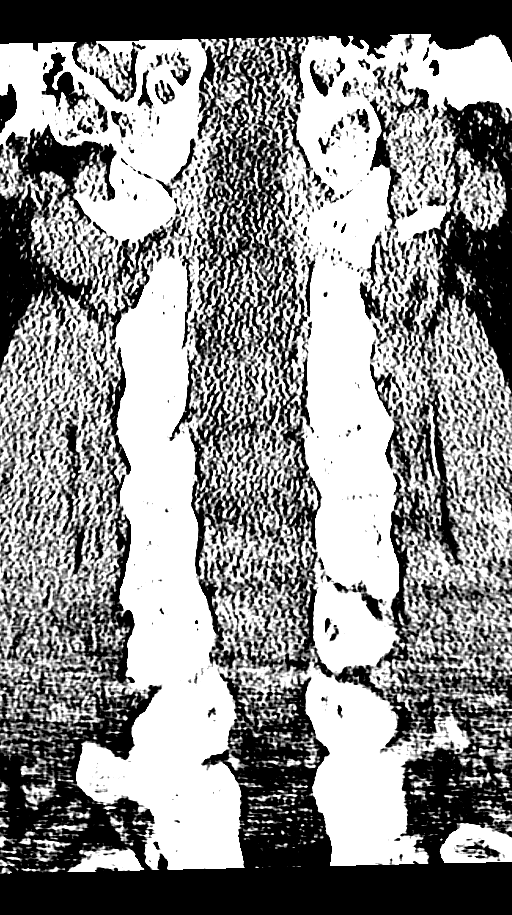
[im 29/53  brain]
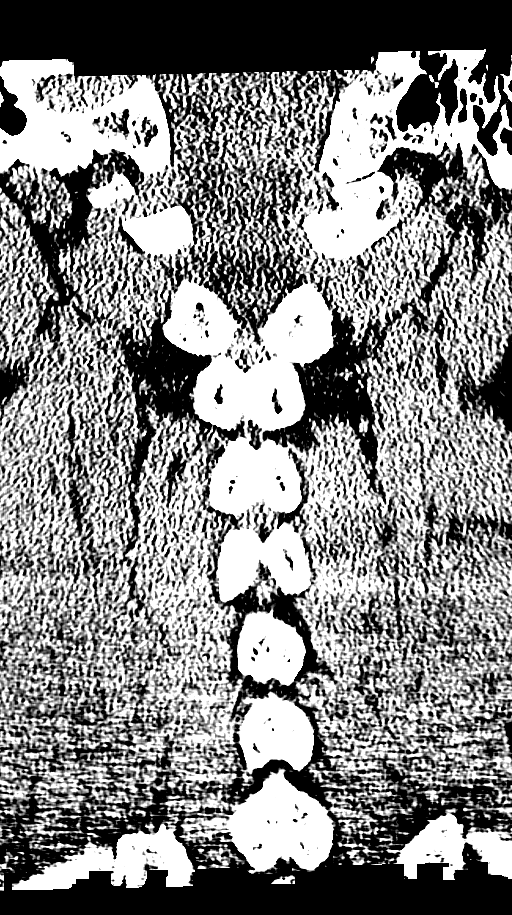

[Series 10: axial bone 2.0 · axial · 0.21mm/px · z∈[+64,+144]mm · 5 of 88 slices shown]
[im 8/88  bone]
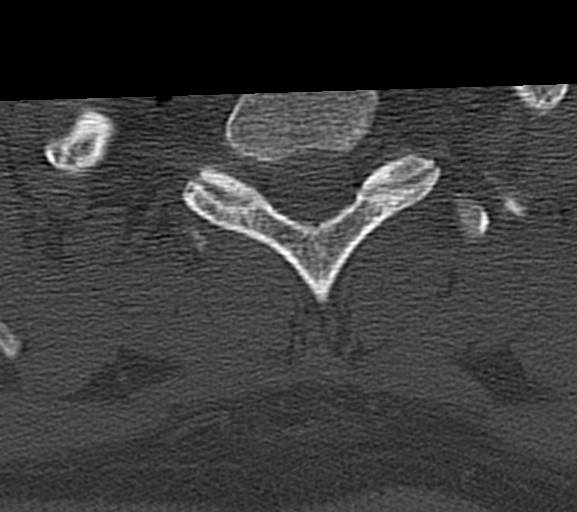
[im 22/88  bone]
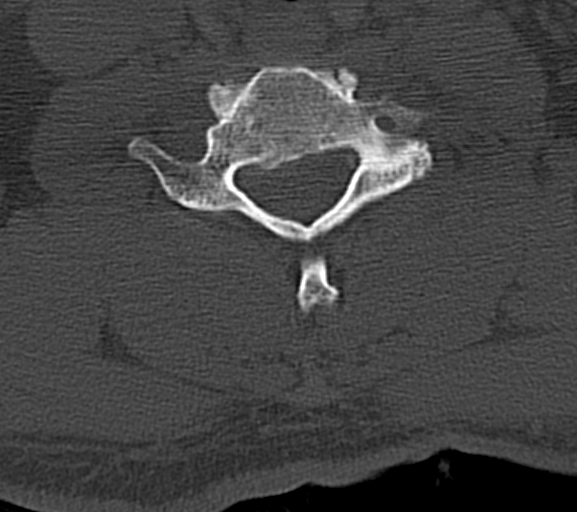
[im 30/88  bone]
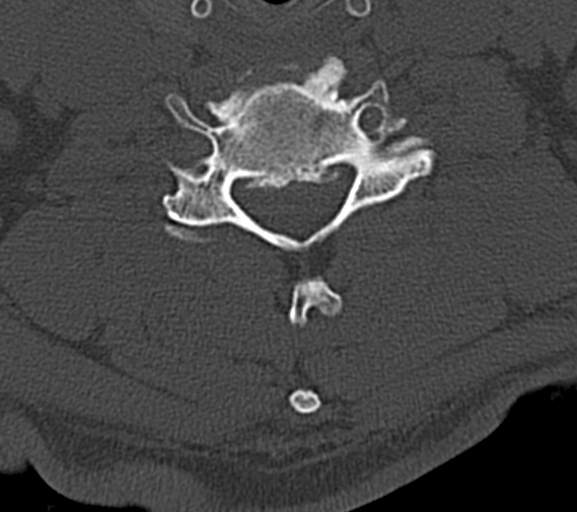
[im 37/88  bone]
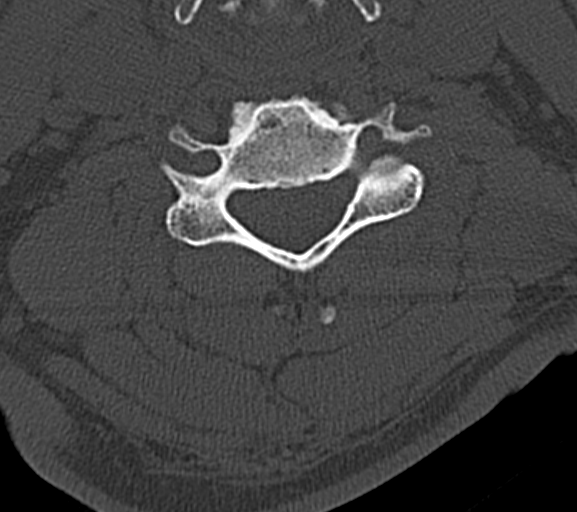
[im 51/88  bone]
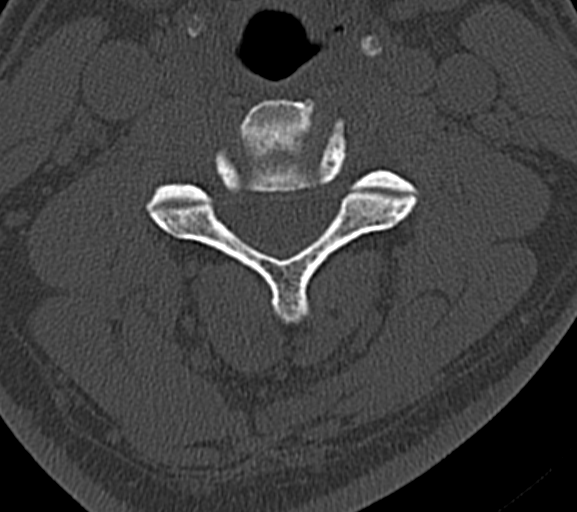

[14 of 47 positions shown; findings below may reference images not displayed]

FINDINGS: No intracranial hemorrhage.  No parenchymal contusion.
No midline shift or mass effect.  Basilar cisterns are patent. No
skull base fracture.  No fluid in the paranasal sinuses or mastoid
air cells.
IMPRESSION: No intracranial trauma

CT CERVICAL SPINE
FINDINGS: No prevertebral soft tissue swelling.  There is
straightening of the normal cervical doses.  There is bulky
osteophytosis at C5 and C6 similar to comparison radiograph from
[W0] although advanced.  Potential fracture of the osteophyte
anterior to the C5 vertebral body.  No prevertebral soft tissue
swelling.  No epidural paraspinal hematoma.  Normal facet
articulation.  Normal craniocervical junction.
IMPRESSION: 1..  No clear evidence of cervical spine fracture.
2.  Potential fracture of the osteophyte   anterior to the C5
inferior endplate

3.  Bulky osteophyte the C5-C6 are increased from comparison plain
film of [W0].
4.. Straightening of the normal cervical lordosis may be secondary
to position, muscle spasm, or ligamentous injury.

## 2012-12-09 MED ORDER — OXYCODONE-ACETAMINOPHEN 5-325 MG PO TABS
1.0000 | ORAL_TABLET | Freq: Once | ORAL | Status: AC
  Administered 2012-12-09: 1 via ORAL
  Filled 2012-12-09: qty 1

## 2012-12-09 MED ORDER — OXYCODONE-ACETAMINOPHEN 5-325 MG PO TABS: 1.0000 | ORAL_TABLET | ORAL | Status: DC | PRN

## 2012-12-09 MED ORDER — IBUPROFEN 600 MG PO TABS
600.0000 mg | ORAL_TABLET | Freq: Four times a day (QID) | ORAL | Status: DC | PRN
Start: 2012-12-09 — End: 2017-04-11

## 2012-12-09 NOTE — ED Provider Notes (Signed)
History     CSN: [id number]  Arrival date & time 12/09/12  1740   First MD Initiated Contact with Patient 12/09/12 1755      Chief Complaint  Patient presents with  . Fall    HPI Patient is a 52 yo previously healthy male presenting after a fall from roof of building yesterday in [place], [place]. States he was on the roof of a building and slid off. He fell onto grass/mud with his right hip taking most of the impact. States he did not hit his head. He is not sure if he had LOC, but remembers the fall. He was able to stand up immediately after the fall and go about his usual work. Later last night he starting having more pain in his lower back, right hip, right arm and right leg. He denies weakness, loss of bowel or bladder. He states he has some bilateral foot numbness at baseline, but the right foot may be more numb now that it was before the fall. Also endorses a headache with some photophobia, but no stiff neck, CP, SOB, abd pain. Current every day smoker and drinks beer daily. Denies any drug use.  Past Medical History  Diagnosis Date  . Hypertension     History reviewed. No pertinent past surgical history.  History reviewed. No pertinent family history.  History  Substance Use Topics  . Smoking status: Current Every Day Smoker -- 0.5 packs/day    Types: Cigarettes  . Smokeless tobacco: Not on file  . Alcohol Use: Yes     Comment: occasionally      Review of Systems  Constitutional: Negative for fever, chills and appetite change.  HENT: Positive for neck pain. Negative for congestion, facial swelling, trouble swallowing and neck stiffness.   Eyes: Positive for photophobia. Negative for visual disturbance.  Respiratory: Negative for shortness of breath.   Cardiovascular: Negative for chest pain.  Gastrointestinal: Negative for nausea, vomiting, abdominal pain, diarrhea, constipation and blood in stool.  Genitourinary: Negative for difficulty urinating.  Musculoskeletal:  Positive for myalgias, back pain and arthralgias.  Skin: Negative for rash and wound.  Neurological: Positive for headaches.  Psychiatric/Behavioral: Negative for confusion.  All other systems reviewed and are negative.    Allergies  Review of patient's allergies indicates no known allergies.  Home Medications   Current Outpatient Rx  Name  Route  Sig  Dispense  Refill  . IBUPROFEN 800 MG PO TABS   Oral   Take 800 mg by mouth every 8 (eight) hours as needed. For pain         . IBUPROFEN 600 MG PO TABS   Oral   Take 1 tablet (600 mg total) by mouth every 6 (six) hours as needed for pain.   30 tablet   0   . OXYCODONE-ACETAMINOPHEN 5-325 MG PO TABS   Oral   Take 1 tablet by mouth every 4 (four) hours as needed for pain.   15 tablet   0     BP 134/82  Pulse 84  Temp 97.8 F (36.6 C)  Resp 20  Ht 5\' 11"  (1.803 m)  Wt 210 lb (95.255 kg)  BMI 29.29 kg/m2  SpO2 96%  Physical Exam  Constitutional: He is oriented to person, place, and time. He appears well-developed and well-nourished. No distress.       Placed supine in C-collar, but initially sitting up talking on phone. No distress  HENT:  Head: Normocephalic and atraumatic.  Right Ear: External  ear normal.  Left Ear: External ear normal.  Mouth/Throat: Oropharynx is clear and moist.  Eyes: Conjunctivae normal are normal. Pupils are equal, round, and reactive to light.  Neck: Neck supple.  Cardiovascular: Regular rhythm.   No murmur heard. Pulmonary/Chest: Effort normal and breath sounds normal. He has no wheezes.  Abdominal: Soft. He exhibits no mass. There is tenderness (Right flank). There is no guarding.  Musculoskeletal: Normal range of motion. He exhibits tenderness (Diffuse TTP). He exhibits no edema.       Good ROM of right shoulder and hip. TTP of upper and lower extremities with any palpation. No obvious bruising or wounds.  Lymphadenopathy:    He has no cervical adenopathy.  Neurological: He is  alert and oriented to person, place, and time. No cranial nerve deficit. Coordination normal.  Skin: Skin is warm and dry. No rash noted. He is not diaphoretic.  Psychiatric: He has a normal mood and affect.    ED Course  Procedures (including critical care time)  Labs Reviewed - No data to display Dg Thoracic Spine 2 View  12/09/2012  *RADIOLOGY REPORT*  Clinical Data: Larey Seat yesterday with back pain  THORACIC SPINE - 2 VIEW  Comparison: None.  Findings: The thoracic vertebrae are in normal alignment.  There is mild degenerative spurring present but no compression deformity is noted.  No paravertebral soft tissue swelling is seen.  IMPRESSION: Mild degenerative change.  No acute compression deformity.   Original Report Authenticated By: Dwyane Dee, M.D.    Dg Lumbar Spine 2-3 Views  12/09/2012  *RADIOLOGY REPORT*  Clinical Data: Larey Seat yesterday with back pain  LUMBAR SPINE - 2-3 VIEW  Comparison: Lumbar spine films of 05/01/2005  Findings: The lumbar vertebrae are in normal alignment.  No compression deformity is seen.  Mild anterior osteophyte formation is noted diffusely.  The SI joints are unremarkable.  IMPRESSION: Normal alignment.  No compression deformity.   Original Report Authenticated By: Dwyane Dee, M.D.    Dg Tibia/fibula Right  12/09/2012  *RADIOLOGY REPORT*  Clinical Data: Larey Seat yesterday with right leg pain  RIGHT TIBIA AND FIBULA - 2 VIEW  Comparison: None.  Findings: The right tibia and fibula are in normal alignment.  No acute fracture is seen.  No soft tissue abnormality is noted.  IMPRESSION: Negative right tibia and fibula.   Original Report Authenticated By: Dwyane Dee, M.D.    Ct Head Wo Contrast  12/09/2012  *RADIOLOGY REPORT*  Clinical Data:  Fall from height  CT HEAD WITHOUT CONTRAST CT CERVICAL SPINE WITHOUT CONTRAST  Technique:  Multidetector CT imaging of the head and cervical spine was performed following the standard protocol without intravenous contrast.  Multiplanar CT  image reconstructions of the cervical spine were also generated.  Comparison:  Cervical spine films 05/14/2006, and head CT 09/22/2005  CT HEAD  Findings: No intracranial hemorrhage.  No parenchymal contusion. No midline shift or mass effect.  Basilar cisterns are patent. No skull base fracture.  No fluid in the paranasal sinuses or mastoid air cells.  IMPRESSION: No intracranial trauma  CT CERVICAL SPINE  Findings: No prevertebral soft tissue swelling.  There is straightening of the normal cervical doses.  There is bulky osteophytosis at C5 and C6 similar to comparison radiograph from 2007 although advanced.  Potential fracture of the osteophyte anterior to the C5 vertebral body.  No prevertebral soft tissue swelling.  No epidural paraspinal hematoma.  Normal facet articulation.  Normal craniocervical junction.  IMPRESSION:  1.  No  clear evidence of cervical spine fracture. 2.  Potential fracture of the osteophyte   anterior to the C5 inferior endplate  3.  Bulky osteophyte the C5-C6 are increased from comparison plain film of 2007. 4. Straightening of the normal cervical lordosis may be secondary to position, muscle spasm, or ligamentous injury.   Original Report Authenticated By: Genevive Bi, M.D.    Ct Cervical Spine Wo Contrast  12/09/2012  *RADIOLOGY REPORT*  Clinical Data:  Fall from height  CT HEAD WITHOUT CONTRAST CT CERVICAL SPINE WITHOUT CONTRAST  Technique:  Multidetector CT imaging of the head and cervical spine was performed following the standard protocol without intravenous contrast.  Multiplanar CT image reconstructions of the cervical spine were also generated.  Comparison:  Cervical spine films 05/14/2006, and head CT 09/22/2005  CT HEAD  Findings: No intracranial hemorrhage.  No parenchymal contusion. No midline shift or mass effect.  Basilar cisterns are patent. No skull base fracture.  No fluid in the paranasal sinuses or mastoid air cells.  IMPRESSION: No intracranial trauma  CT CERVICAL  SPINE  Findings: No prevertebral soft tissue swelling.  There is straightening of the normal cervical doses.  There is bulky osteophytosis at C5 and C6 similar to comparison radiograph from 2007 although advanced.  Potential fracture of the osteophyte anterior to the C5 vertebral body.  No prevertebral soft tissue swelling.  No epidural paraspinal hematoma.  Normal facet articulation.  Normal craniocervical junction.  IMPRESSION:  1.  No clear evidence of cervical spine fracture. 2.  Potential fracture of the osteophyte   anterior to the C5 inferior endplate  3.  Bulky osteophyte the C5-C6 are increased from comparison plain film of 2007. 4. Straightening of the normal cervical lordosis may be secondary to position, muscle spasm, or ligamentous injury.   Original Report Authenticated By: Genevive Bi, M.D.    Dg Cerv Spine Flex&ext Only  12/09/2012  *RADIOLOGY REPORT*  Clinical Data: Neck pain  CERVICAL SPINE - FLEXION AND EXTENSION VIEWS ONLY  Comparison: CT cervical spine of 12/09/2012  Findings: Through flexion and extension there is normal range of motion of the cervical vertebrae with no malalignment. Degenerative disc disease again is noted at C5-6 and C6-7 levels. As noted on the CT of the cervical spine, there may be a fracture of the anterior osteophyte of C5.  No significant prevertebral soft tissue swelling is seen.  IMPRESSION:  1.  Normal range of motion through flexion and extension.  No malalignment. 2.  Cannot exclude fracture of the anterior osteophyte emanating from C5 as noted on CT of the cervical spine.   Original Report Authenticated By: Dwyane Dee, M.D.    Dg Foot Complete Right  12/09/2012  *RADIOLOGY REPORT*  Clinical Data: Larey Seat yesterday with right foot pain  RIGHT FOOT COMPLETE - 3+ VIEW  Comparison: None.  Findings: Tarsal - metatarsal alignment is normal.  Joint spaces appear normal.  No fracture is seen.  Small calcaneal spurs are noted.  IMPRESSION: No acute abnormality.  Small  calcaneal degenerative spurs   Original Report Authenticated By: Dwyane Dee, M.D.     1800- Patient seen and examined. Placed in C-collar based on mechanism of fall. Not distressed. Will order imaging studies and give pain medication. 2000- Patient's cervical CT scan shows possible fracture of C5 osteophyte. Dr. Bebe Shaggy consulted neurosurgery by phone who recommends flexion/extension films of neck. 2050- Neck films do not show any malalignment. Patient informed that he will be discharged with aspen collar for comfort. He  should follow up with neurosurgery with any changes, and establish a PCP for routine follow up. Given Percocet #15 and Ibuprofen #30 for pain.   1. Fall from roof   2. Cervical osteophyte     MDM  52 yo M presenting s/p fall yesterday from roof, now with worsening body pain  Exam impressive for diffuse pain. Imaging completed wnl except for a possible fracture of C5 anterior osteophyte. Further imaging showed stable neck. Patient's pain improved with Percocet in the ED. Pain is most likely due to the impact of the fall more than actual trauma to any bones/joints/ligaments. For the neck, will be discharged with collar and pain medication. He should wear collar for comfort and use pain medication as needed. For other aches, he should use ibuprofen and ice locally. Instructions explained to patient and family member. Discharged home in stable condition.     Hilarie Fredrickson, MD 12/09/12 2115

## 2012-12-09 NOTE — ED Notes (Signed)
Pt in Radiology at this time

## 2012-12-09 NOTE — ED Notes (Signed)
Aspen C Collar applied, pt advised to continue wearing the collar at home until he follows up with doctor. Pt able to ambulate independently and without difficulty at this time.

## 2012-12-09 NOTE — ED Notes (Addendum)
Patient fell from 14 foot building yesterday at work in [place], [place].  States pain in lower R back, leg, knee and ankle became more severe today, prompting ER visit.  Tender to palpation at upper thoracic and lower lumbar spine.  Knee and lower back more painful with extension.  No acute edema of ankle or knee noted.  No warmth over joints.

## 2012-12-09 NOTE — ED Provider Notes (Signed)
I have personally seen and examined the patient.  I have discussed the plan of care with the resident.  I have reviewed the documentation on PMH/FH/Soc. History.  I have reviewed the documentation of the resident and agree.   Joya Gaskins, MD 12/09/12 2117

## 2012-12-09 NOTE — ED Provider Notes (Signed)
I spoke to nsgy dr Newell Coral about Ct imaging He reviewed CT imaging He requests cervical flex/ex films Pt without any focal neuro deficits Imaging ordered Dr Newell Coral feels if flex/ex films are negative he can f/u with PCP and no cervical collar  Joya Gaskins, MD 12/09/12 2013
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
## 2017-04-11 ENCOUNTER — Encounter (HOSPITAL_COMMUNITY): Payer: Self-pay | Admitting: Emergency Medicine

## 2017-04-11 ENCOUNTER — Emergency Department (HOSPITAL_COMMUNITY)
Admission: EM | Admit: 2017-04-11 | Discharge: 2017-04-11 | Disposition: A | Discharge status: Home / Self Care | Payer: Self-pay | Attending: Emergency Medicine | Admitting: Emergency Medicine

## 2017-04-11 DIAGNOSIS — M62830 Muscle spasm of back: Secondary | ICD-10-CM

## 2017-04-11 DIAGNOSIS — I10 Essential (primary) hypertension: Secondary | ICD-10-CM | POA: Insufficient documentation

## 2017-04-11 DIAGNOSIS — M47896 Other spondylosis, lumbar region: Secondary | ICD-10-CM

## 2017-04-11 DIAGNOSIS — F17210 Nicotine dependence, cigarettes, uncomplicated: Secondary | ICD-10-CM | POA: Insufficient documentation

## 2017-04-11 MED ORDER — IBUPROFEN 800 MG PO TABS
800.0000 mg | ORAL_TABLET | Freq: Once | ORAL | Status: AC
  Administered 2017-04-11: 800 mg via ORAL
  Filled 2017-04-11: qty 1

## 2017-04-11 MED ORDER — TRAMADOL HCL 50 MG PO TABS
100.0000 mg | ORAL_TABLET | Freq: Once | ORAL | Status: AC
  Administered 2017-04-11: 100 mg via ORAL
  Filled 2017-04-11: qty 2

## 2017-04-11 MED ORDER — DEXAMETHASONE 4 MG PO TABS: 4.0000 mg | ORAL_TABLET | Freq: Two times a day (BID) | ORAL | 0 refills | Status: DC

## 2017-04-11 MED ORDER — CYCLOBENZAPRINE HCL 10 MG PO TABS: 10.0000 mg | ORAL_TABLET | Freq: Three times a day (TID) | ORAL | 0 refills | Status: DC

## 2017-04-11 MED ORDER — DIAZEPAM 5 MG PO TABS
10.0000 mg | ORAL_TABLET | Freq: Once | ORAL | Status: AC
  Administered 2017-04-11: 10 mg via ORAL
  Filled 2017-04-11: qty 2

## 2017-04-11 MED ORDER — IBUPROFEN 600 MG PO TABS: 600.0000 mg | ORAL_TABLET | Freq: Four times a day (QID) | ORAL | 0 refills | Status: DC

## 2017-04-11 MED ORDER — PREDNISONE 20 MG PO TABS
40.0000 mg | ORAL_TABLET | Freq: Once | ORAL | Status: AC
  Administered 2017-04-11: 40 mg via ORAL
  Filled 2017-04-11: qty 2

## 2017-04-11 NOTE — ED Triage Notes (Signed)
Pt c/o lower back pain, right arm and right leg for over a month.

## 2017-04-11 NOTE — Discharge Instructions (Signed)
Please see the physicians at the [facility] clinic to assist with your discomfort until you can be referred to an orthopedic specialist concerning your back. Please use warm tub soaks for about 10-15 minutes daily. Please use medications as ordered. Flexeril may cause drowsiness. Please do not take alcohol, drive a vehicle, operating machinery, or participate in activities requiring concentration when taking this medication.
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
## 2019-05-23 ENCOUNTER — Emergency Department (HOSPITAL_COMMUNITY): Payer: Self-pay

## 2019-05-23 ENCOUNTER — Emergency Department (HOSPITAL_COMMUNITY)
Admission: EM | Admit: 2019-05-23 | Discharge: 2019-05-23 | Disposition: A | Discharge status: Home / Self Care | Payer: Self-pay | Attending: Emergency Medicine | Admitting: Emergency Medicine

## 2019-05-23 ENCOUNTER — Other Ambulatory Visit: Payer: Self-pay

## 2019-05-23 ENCOUNTER — Encounter (HOSPITAL_COMMUNITY): Payer: Self-pay | Admitting: *Deleted

## 2019-05-23 DIAGNOSIS — R51 Headache: Secondary | ICD-10-CM | POA: Insufficient documentation

## 2019-05-23 DIAGNOSIS — I10 Essential (primary) hypertension: Secondary | ICD-10-CM | POA: Insufficient documentation

## 2019-05-23 DIAGNOSIS — R519 Headache, unspecified: Secondary | ICD-10-CM

## 2019-05-23 DIAGNOSIS — F17210 Nicotine dependence, cigarettes, uncomplicated: Secondary | ICD-10-CM | POA: Insufficient documentation

## 2019-05-23 DIAGNOSIS — Z79899 Other long term (current) drug therapy: Secondary | ICD-10-CM | POA: Insufficient documentation

## 2019-05-23 LAB — BASIC METABOLIC PANEL
Anion gap: 10 (ref 5–15)
BUN: 12 mg/dL (ref 6–20)
CO2: 22 mmol/L (ref 22–32)
Calcium: 9 mg/dL (ref 8.9–10.3)
Chloride: 107 mmol/L (ref 98–111)
Creatinine, Ser: 0.87 mg/dL (ref 0.61–1.24)
GFR calc Af Amer: >60 mL/min (ref >60)
GFR calc non Af Amer: >60 mL/min (ref >60)
Glucose, Bld: 102 mg/dL — ABNORMAL HIGH (ref 70–99)
Potassium: 3.7 mmol/L (ref 3.5–5.1)
Sodium: 139 mmol/L (ref 135–145)

## 2019-05-23 LAB — CBC WITH DIFFERENTIAL/PLATELET
Abs Immature Granulocytes: 0.03 10*3/uL (ref 0.00–0.07)
Basophils Absolute: 0.1 10*3/uL (ref 0.0–0.1)
Basophils Relative: 1 %
Eosinophils Absolute: 0.1 10*3/uL (ref 0.0–0.5)
Eosinophils Relative: 2 %
HCT: 41.6 % (ref 39.0–52.0)
Hemoglobin: 14.4 g/dL (ref 13.0–17.0)
Immature Granulocytes: 1 %
Lymphocytes Relative: 45 %
Lymphs Abs: 2.4 10*3/uL (ref 0.7–4.0)
MCH: 32.4 pg (ref 26.0–34.0)
MCHC: 34.6 g/dL (ref 30.0–36.0)
MCV: 93.7 fL (ref 80.0–100.0)
Monocytes Absolute: 0.6 10*3/uL (ref 0.1–1.0)
Monocytes Relative: 11 %
Neutro Abs: 2.1 10*3/uL (ref 1.7–7.7)
Neutrophils Relative %: 40 %
Platelets: 359 10*3/uL (ref 150–400)
RBC: 4.44 MIL/uL (ref 4.22–5.81)
RDW: 12.6 % (ref 11.5–15.5)
WBC: 5.3 10*3/uL (ref 4.0–10.5)
nRBC: 0 % (ref 0.0–0.2)

## 2019-05-23 LAB — CBG MONITORING, ED: Glucose-Capillary: 98 mg/dL (ref 70–99)

## 2019-05-23 IMAGING — CT CT HEAD WITHOUT CONTRAST
4 of 7 series · 16 of 47 positions shown, 19 images · non-contrast
Comparison: [DATE]

CLINICAL DATA: Left sided posterior headache for several days with
dizziness, initial encounter

EXAM:
CT HEAD WITHOUT CONTRAST
CT CERVICAL SPINE WITHOUT CONTRAST
TECHNIQUE: Multidetector CT imaging of the head and cervical spine was
performed following the standard protocol without intravenous
contrast. Multiplanar CT image reconstructions of the cervical spine
were also generated.

[Series 3: head w o · axial · 0.47mm/px · z∈[+321,+376]mm · 2 of 34 slices shown]
[im 12/34  brain]
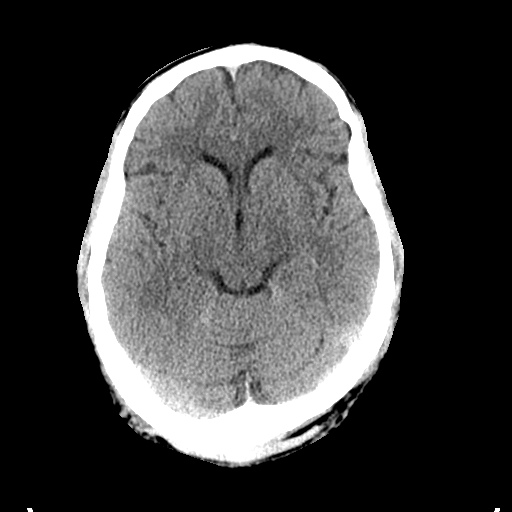
[im 23/34  brain]
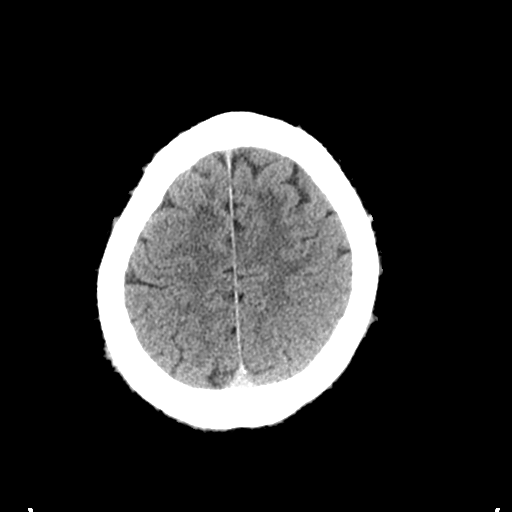

[Series 5: coronal soft · coronal · 0.36mm/px · 3 of 72 slices shown]
[im 21/72  brain]
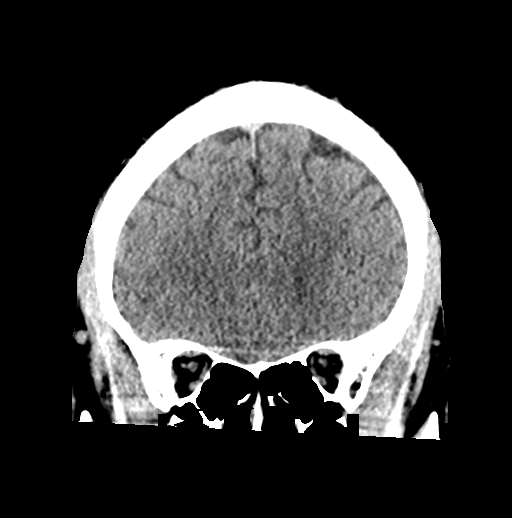
[im 31/72  brain]
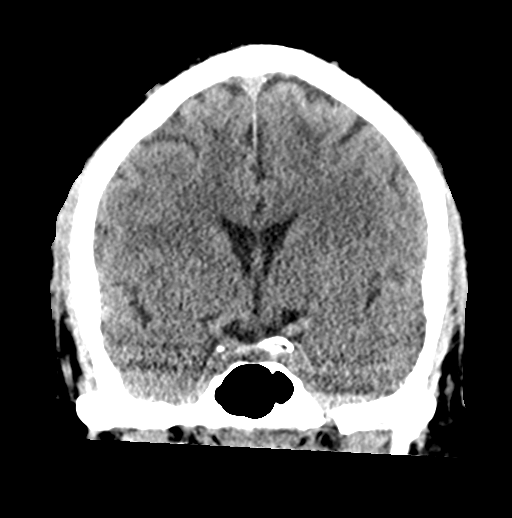
[im 41/72  brain]
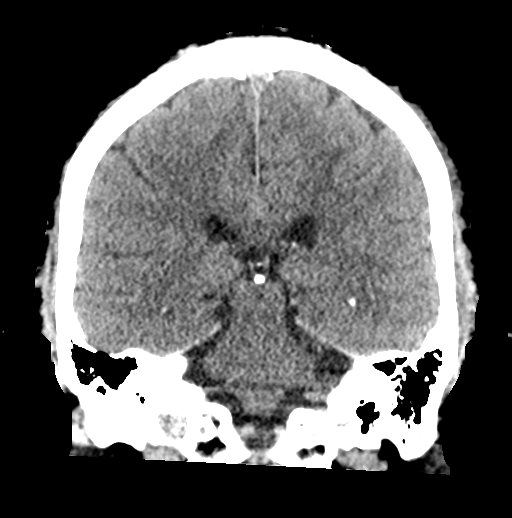

[Series 6: sagittal soft · sagittal · 0.36mm/px · 1 of 62 slices shown]
[im 31/62  brain]
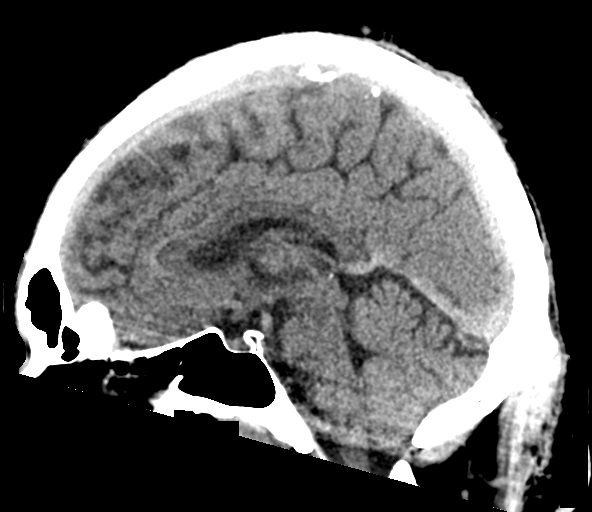

[Series 11: orthogonal axials · axial · 0.21mm/px · z∈[+85,+216]mm · 10 of 94 slices shown, 13 images]
[im 9/94  brain]
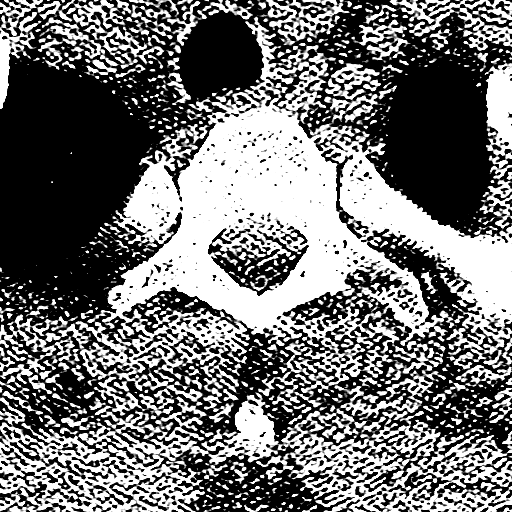
[im 9/94  bone]
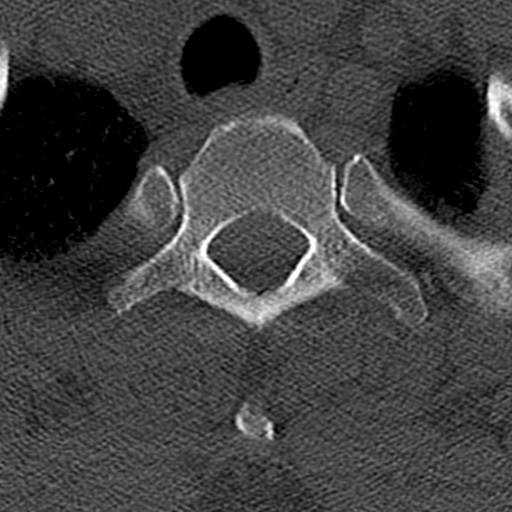
[im 17/94  brain]
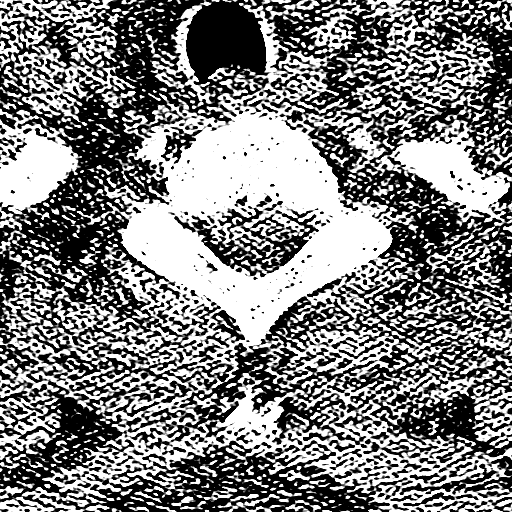
[im 26/94  brain]
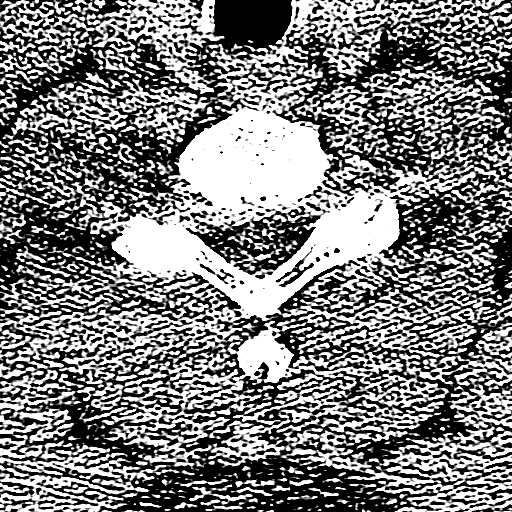
[im 34/94  brain]
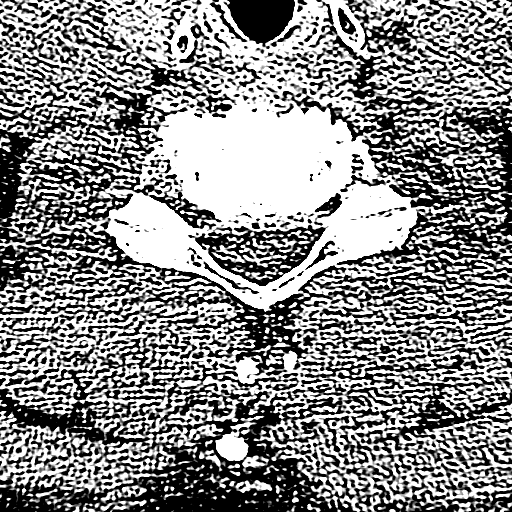
[im 43/94  brain]
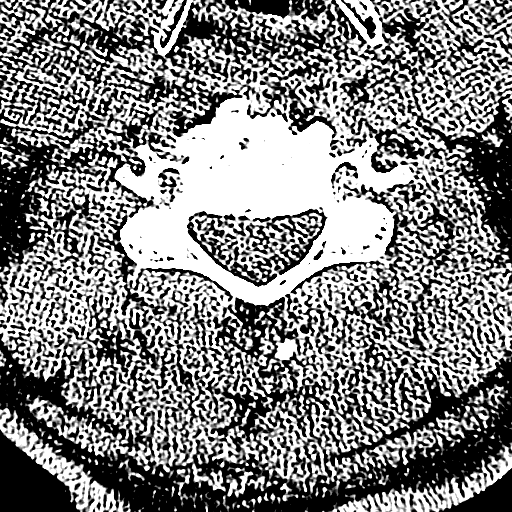
[im 43/94  bone]
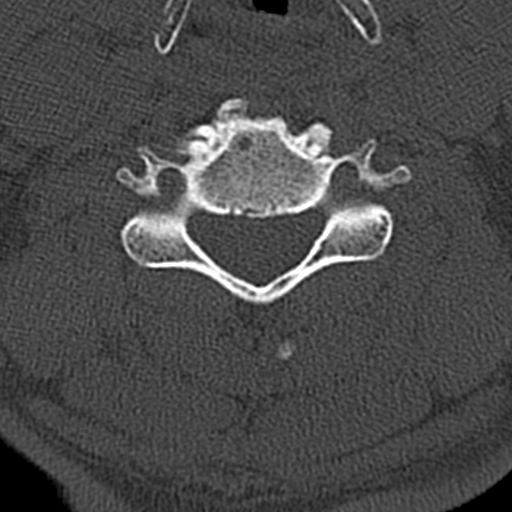
[im 51/94  brain]
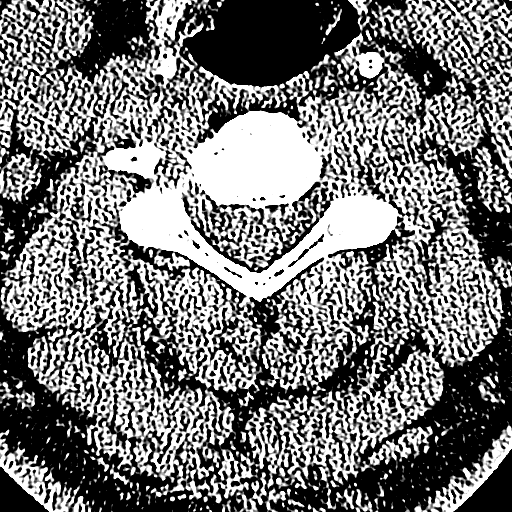
[im 60/94  brain]
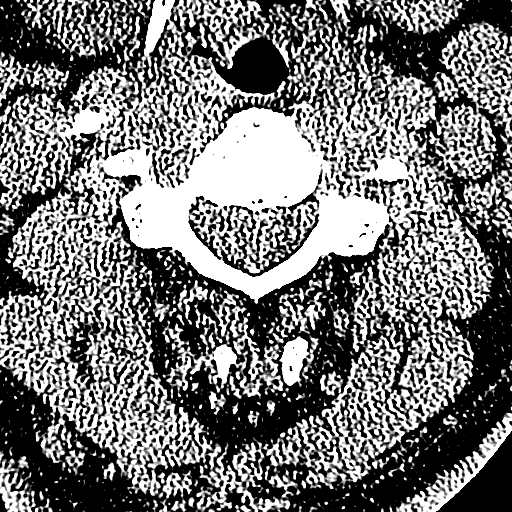
[im 68/94  brain]
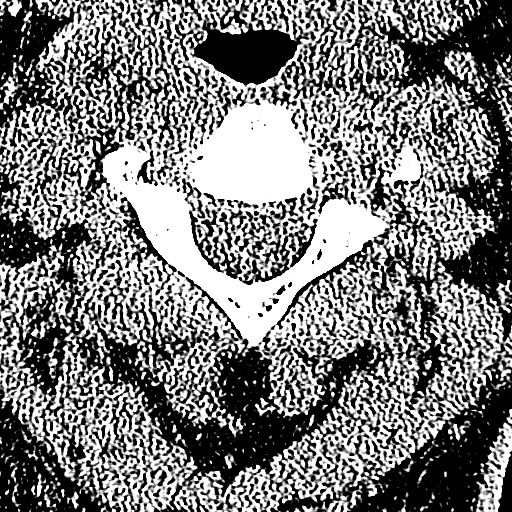
[im 77/94  brain]
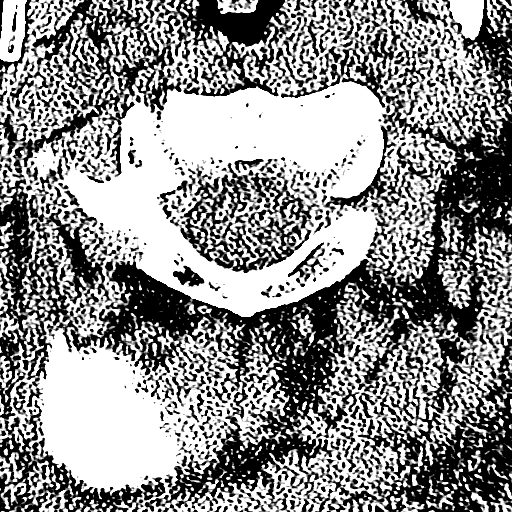
[im 77/94  bone]
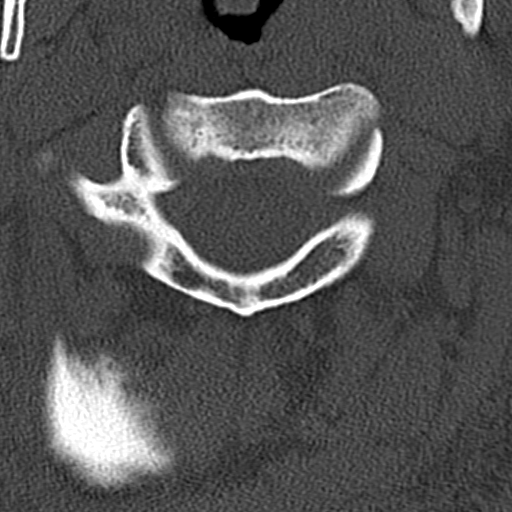
[im 85/94  brain]
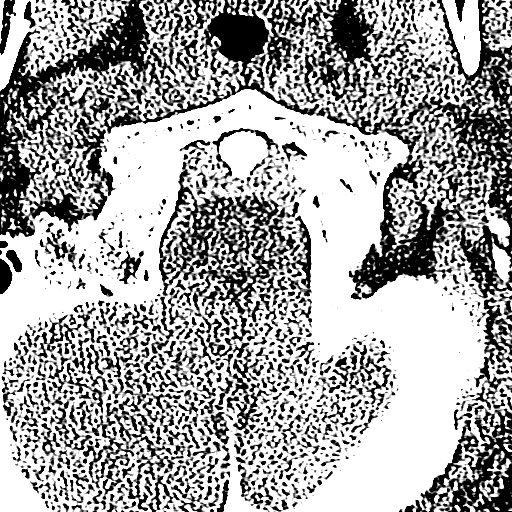

[16 of 47 positions shown; findings below may reference images not displayed]

FINDINGS: CT HEAD FINDINGS

Brain: No evidence of acute infarction, hemorrhage, hydrocephalus,
extra-axial collection or mass lesion/mass effect.

Vascular: No hyperdense vessel or unexpected calcification.

Skull: Normal. Negative for fracture or focal lesion.

Sinuses/Orbits: No acute finding.

Other: None.

CT CERVICAL SPINE FINDINGS

Alignment: Within normal limits.

Skull base and vertebrae: 7 cervical segments are well visualized.
Vertebral body height is well maintained. Multilevel osteophytic
changes are noted from C3-C7. Disc space narrowing is seen at C5-6
and C6-7. No acute fracture or acute facet abnormality is noted.

Soft tissues and spinal canal: Surrounding soft tissue structures
are within normal limits.

Upper chest: Lung apices are within normal limits.

Other: None
IMPRESSION: CT of the head: No acute intracranial abnormality noted.

CT of the cervical spine: Mild degenerative change of the cervical
spine without acute abnormality.

## 2019-05-23 MED ORDER — METHOCARBAMOL 500 MG PO TABS: 500.0000 mg | ORAL_TABLET | Freq: Every evening | ORAL | 0 refills | Status: DC | PRN

## 2019-05-23 MED ORDER — SODIUM CHLORIDE 0.9 % IV BOLUS
1000.0000 mL | Freq: Once | INTRAVENOUS | Status: AC
  Administered 2019-05-23: 1000 mL via INTRAVENOUS

## 2019-05-23 MED ORDER — DIPHENHYDRAMINE HCL 50 MG/ML IJ SOLN
12.5000 mg | Freq: Once | INTRAMUSCULAR | Status: AC
  Administered 2019-05-23: 12.5 mg via INTRAVENOUS
  Filled 2019-05-23: qty 1

## 2019-05-23 MED ORDER — NAPROXEN 500 MG PO TABS: 500.0000 mg | ORAL_TABLET | Freq: Two times a day (BID) | ORAL | 0 refills | Status: DC

## 2019-05-23 MED ORDER — PROCHLORPERAZINE EDISYLATE 10 MG/2ML IJ SOLN
10.0000 mg | Freq: Once | INTRAMUSCULAR | Status: AC
  Administered 2019-05-23: 18:00:00 10 mg via INTRAVENOUS
  Filled 2019-05-23: qty 2

## 2019-05-23 MED ORDER — KETOROLAC TROMETHAMINE 15 MG/ML IJ SOLN
15.0000 mg | Freq: Once | INTRAMUSCULAR | Status: DC
  Filled 2019-05-23: qty 1

## 2019-05-23 MED ORDER — FENTANYL CITRATE (PF) 100 MCG/2ML IJ SOLN
12.5000 ug | Freq: Once | INTRAMUSCULAR | Status: AC
  Administered 2019-05-23: 18:00:00 12.5 ug via INTRAVENOUS
  Filled 2019-05-23: qty 2

## 2019-05-23 NOTE — ED Triage Notes (Signed)
Pt states he passed out twice today.

## 2019-05-23 NOTE — ED Triage Notes (Signed)
Pt with left posterior headache for past 3 days, dizziness for a week.  Pt admits to working outside all week.

## 2019-05-23 NOTE — ED Provider Notes (Signed)
[name] EMERGENCY DEPARTMENT Provider Note   CSN: [id number] Arrival date & time: 05/23/19  1632     History   Chief Complaint Chief Complaint  Patient presents with  . Headache    HPI Larry Nichols is a 58 y.o. male who presents with a headache.  Past medical history significant for hypertension.  Patient states that for the past week he has had coming and going left-sided headache which radiates in the left side of his head to the side of his neck.  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  He also endorses multiple syncopal episodes over the past week.  He states he will just get dizzy, lightheaded and feel like you cannot pass out.  He states that his headache is severe and feels like he got hit in the head with a hammer.  Cannot tell me if it started all of a sudden versus gradually worsened.  He endorses intermittent blurry vision but no double vision.  He denies fever, neck pain, nausea, vomiting.  No chest pain, shortness of breath, cough, abdominal pain.  He has never had a headache like this before. No head trauma.      HPI  Past Medical History:  Diagnosis Date  . Hypertension     There are no active problems to display for this patient.   History reviewed. No pertinent surgical history.      Home Medications    Prior to Admission medications   Medication Sig Start Date End Date Taking? Authorizing Provider  cyclobenzaprine (FLEXERIL) 10 MG tablet Take 1 tablet (10 mg total) by mouth 3 (three) times daily. 04/11/17   Larry Kocher, PA-C  dexamethasone (DECADRON) 4 MG tablet Take 1 tablet (4 mg total) by mouth 2 (two) times daily with a meal. 04/11/17   Larry Kocher, PA-C  ibuprofen (ADVIL,MOTRIN) 600 MG tablet Take 1 tablet (600 mg total) by mouth 4 (four) times daily. 04/11/17   Larry Kocher, PA-C  oxyCODONE-acetaminophen (PERCOCET/ROXICET) 5-325 MG per tablet Take 1 tablet by  mouth every 4 (four) hours as needed for pain. 12/09/12   Nichols, Larry Pita, MD    Family History History reviewed. No pertinent family history.  Social History Social History   Tobacco Use  . Smoking status: Current Every Day Smoker    Packs/day: 0.50    Types: Cigarettes  . Smokeless tobacco: Never Used  Substance Use Topics  . Alcohol use: Yes    Comment: occasionally  . Drug use: No     Allergies   Patient has no known allergies.   Review of Systems Review of Systems  Constitutional: Negative for fever.  Eyes: Positive for visual disturbance.  Respiratory: Negative for shortness of breath.   Cardiovascular: Negative for chest pain.  Gastrointestinal: Negative for abdominal pain, constipation, nausea and vomiting.  Genitourinary: Negative for difficulty urinating.  Neurological: Positive for dizziness, syncope, light-headedness and headaches. Negative for tremors, seizures, speech difficulty, weakness and numbness.  All other systems reviewed and are negative.    Physical Exam Updated Vital Signs BP (!) 158/113   Pulse (!) 108   Temp 98.3 F (36.8 C) (Oral)   Resp 20   SpO2 96%   Physical Exam Vitals signs and nursing note reviewed.  Constitutional:      General: He is not in acute distress.    Appearance: Normal appearance. He is well-developed. He  is not ill-appearing.  HENT:     Head: Normocephalic and atraumatic.     Jaw: There is normal jaw occlusion.      Comments: Tenderness along the left parietal scalp. No tenderness over temporal artery. No redness or swelling noted Eyes:     General: No scleral icterus.       Right eye: No discharge.        Left eye: No discharge.     Conjunctiva/sclera: Conjunctivae normal.     Pupils: Pupils are equal, round, and reactive to light.  Neck:     Musculoskeletal: Normal range of motion.  Cardiovascular:     Rate and Rhythm: Normal rate.  Pulmonary:     Effort: Pulmonary effort is normal. No respiratory  distress.  Abdominal:     General: There is no distension.  Skin:    General: Skin is warm and dry.  Neurological:     Mental Status: He is alert and oriented to person, place, and time.     Comments: Lying on stretcher in NAD. GCS 15. Speaks in a clear voice. Cranial nerves II through XII grossly intact. 5/5 strength in all extremities. Sensation fully intact.  Bilateral finger-to-nose intact. Ambulatory   Psychiatric:        Behavior: Behavior normal.      ED Treatments / Results  Labs (all labs ordered are listed, but only abnormal results are displayed) Labs Reviewed  BASIC METABOLIC PANEL - Abnormal; Notable for the following components:      Result Value   Glucose, Bld 102 (*)    All other components within normal limits  CBC WITH DIFFERENTIAL/PLATELET  CBG MONITORING, ED    EKG EKG Interpretation  Date/Time:  Tuesday May 23 2019 16:51:52 EDT Ventricular Rate:  87 PR Interval:    QRS Duration: 114 QT Interval:  371 QTC Calculation: 447 R Axis:   93 Text Interpretation:  Sinus rhythm Consider left atrial enlargement Borderline intraventricular conduction delay RSR' in V1 or V2, right VCD or RVH Confirmed by Larry Nichols [phone]) on 05/23/2019 5:04:35 PM   Radiology No results found.  Procedures Procedures (including critical care time)  Medications Ordered in ED Medications  prochlorperazine (COMPAZINE) injection 10 mg (has no administration in time range)  diphenhydrAMINE (BENADRYL) injection 12.5 mg (has no administration in time range)  sodium chloride 0.9 % bolus 1,000 mL (has no administration in time range)  fentaNYL (SUBLIMAZE) injection 12.5 mcg (has no administration in time range)     Initial Impression / Assessment and Plan / ED Course  I have reviewed the triage vital signs and the nursing notes.  Pertinent labs & imaging results that were available during my care of the patient were reviewed by me and considered in my medical decision making  (see chart for details).  59 year old male presents with acute left sided headache. He also has had a couple syncopal episodes. He is mildly hypertensive and tachycardic here. On recheck his HR has improved but not BP. He has a normal neurologic exam. His head and neck are tender on the left side. There is no tenderness over the temporal artery. No redness/swelling to suggest infection such as mastoiditis. He denies CP or SOB so doubt PE for cause of syncope. He states he works outside and feels like it could be from dehydration (although also says he drinks a lot of fluids). Will obtain labs, EKG, CT head and C-spine as he has not had symptoms like this before.  Will give headache cocktail minus Toradol until I get results of CT.   7:06 PM Rechecked pt. He doesn't feel better. EKG is SR. CTs and labs are reassuring. I was going to order Toradol for him but he is refusing stating he hates hospitals and doesn't want any further treatment or work up here. He is requesting that I send him in some "pills for headache". I think most likely his headache is likely MSK related as it is reproducible on exam. Will give rx for Naproxen and Robaxin. He was encouraged to establish care with a PCP and return if worsening   Final Clinical Impressions(s) / ED Diagnoses   Final diagnoses:  Acute intractable headache, unspecified headache type    ED Discharge Orders    None       Recardo Evangelist, PA-C 05/23/19 Christy Sartorius, MD 05/24/19 1357

## 2019-05-23 NOTE — ED Notes (Signed)
ekg done and handed to Dr. Cook 

## 2019-05-23 NOTE — Discharge Instructions (Signed)
Take Naproxen for headache Take Robaxin for neck pain and muscle spasms (this medicine can make you sleepy) Please follow up with a primary doctor Return if worsening

## 2019-09-28 ENCOUNTER — Encounter (HOSPITAL_COMMUNITY): Payer: Self-pay

## 2019-09-28 ENCOUNTER — Emergency Department (HOSPITAL_COMMUNITY)
Admission: EM | Admit: 2019-09-28 | Discharge: 2019-09-29 | Disposition: A | Discharge status: Home / Self Care | Payer: BLUE CROSS/BLUE SHIELD | Attending: Emergency Medicine | Admitting: Emergency Medicine

## 2019-09-28 ENCOUNTER — Other Ambulatory Visit: Payer: Self-pay

## 2019-09-28 DIAGNOSIS — Z79899 Other long term (current) drug therapy: Secondary | ICD-10-CM | POA: Diagnosis not present

## 2019-09-28 DIAGNOSIS — Z7289 Other problems related to lifestyle: Secondary | ICD-10-CM

## 2019-09-28 DIAGNOSIS — I10 Essential (primary) hypertension: Secondary | ICD-10-CM | POA: Diagnosis not present

## 2019-09-28 DIAGNOSIS — Y906 Blood alcohol level of 120-199 mg/100 ml: Secondary | ICD-10-CM | POA: Diagnosis not present

## 2019-09-28 DIAGNOSIS — M25551 Pain in right hip: Secondary | ICD-10-CM | POA: Insufficient documentation

## 2019-09-28 DIAGNOSIS — G8929 Other chronic pain: Secondary | ICD-10-CM

## 2019-09-28 DIAGNOSIS — Z789 Other specified health status: Secondary | ICD-10-CM

## 2019-09-28 DIAGNOSIS — R531 Weakness: Secondary | ICD-10-CM | POA: Diagnosis not present

## 2019-09-28 DIAGNOSIS — R52 Pain, unspecified: Secondary | ICD-10-CM

## 2019-09-28 DIAGNOSIS — R519 Headache, unspecified: Secondary | ICD-10-CM | POA: Insufficient documentation

## 2019-09-28 DIAGNOSIS — F1099 Alcohol use, unspecified with unspecified alcohol-induced disorder: Secondary | ICD-10-CM | POA: Insufficient documentation

## 2019-09-28 DIAGNOSIS — F17210 Nicotine dependence, cigarettes, uncomplicated: Secondary | ICD-10-CM | POA: Insufficient documentation

## 2019-09-28 MED ORDER — ACETAMINOPHEN 500 MG PO TABS
1000.0000 mg | ORAL_TABLET | Freq: Once | ORAL | Status: AC
  Administered 2019-09-29: 1000 mg via ORAL
  Filled 2019-09-28: qty 2

## 2019-09-28 MED ORDER — VITAMIN B-1 100 MG PO TABS
100.0000 mg | ORAL_TABLET | Freq: Once | ORAL | Status: AC
  Administered 2019-09-29: 100 mg via ORAL
  Filled 2019-09-28: qty 1

## 2019-09-28 MED ORDER — SODIUM CHLORIDE 0.9 % IV BOLUS
500.0000 mL | Freq: Once | INTRAVENOUS | Status: AC
  Administered 2019-09-29: 500 mL via INTRAVENOUS

## 2019-09-28 MED ORDER — THIAMINE HCL 100 MG/ML IJ SOLN: 100.0000 mg | Freq: Once | INTRAMUSCULAR | Status: DC

## 2019-09-28 NOTE — ED Triage Notes (Signed)
Pt reports headache for over a week, pain that goes down his right side into his right leg.  Pt reports weakness all over.  Pt states he has some numbness and weakness to his right arm and leg for over a week.

## 2019-09-28 NOTE — ED Provider Notes (Signed)
[facility] EMERGENCY DEPARTMENT Provider Note   CSN: [id number] Arrival date & time: 09/28/19  2326     History   Chief Complaint Chief Complaint  Patient presents with  . Headache    HPI Larry Nichols is a 58 y.o. male.     Patient with history of high blood pressure and headaches, seen in the ER for headache presents with pain in the right leg and right arm for the past few weeks.  At times his right leg gives out on him causing him to fall.  Patient feels more pain than weakness.  No history of stroke, no new head injury.  Patient drinks alcohol daily including today.  Patient denies blood thinner use.  No back surgery history.  No neck pain.  No fevers or infectious symptoms.     Past Medical History:  Diagnosis Date  . Hypertension     There are no active problems to display for this patient.   History reviewed. No pertinent surgical history.      Home Medications    Prior to Admission medications   Medication Sig Start Date End Date Taking? Authorizing Provider  cyclobenzaprine (FLEXERIL) 10 MG tablet Take 1 tablet (10 mg total) by mouth 3 (three) times daily. 04/11/17   Lily Kocher, PA-C  dexamethasone (DECADRON) 4 MG tablet Take 1 tablet (4 mg total) by mouth 2 (two) times daily with a meal. 04/11/17   Lily Kocher, PA-C  ibuprofen (ADVIL,MOTRIN) 600 MG tablet Take 1 tablet (600 mg total) by mouth 4 (four) times daily. 04/11/17   Lily Kocher, PA-C  methocarbamol (ROBAXIN) 500 MG tablet Take 1 tablet (500 mg total) by mouth at bedtime and may repeat dose one time if needed. 05/23/19   Recardo Evangelist, PA-C  naproxen (NAPROSYN) 500 MG tablet Take 1 tablet (500 mg total) by mouth 2 (two) times daily. 05/23/19   Recardo Evangelist, PA-C  oxyCODONE-acetaminophen (PERCOCET/ROXICET) 5-325 MG per tablet Take 1 tablet by mouth every 4 (four) hours as needed for pain. 12/09/12   Hairford, Tyler Pita, MD    Family History No family history on file.  Social  History Social History   Tobacco Use  . Smoking status: Current Every Day Smoker    Packs/day: 0.50    Types: Cigarettes  . Smokeless tobacco: Never Used  Substance Use Topics  . Alcohol use: Yes    Comment: occasionally  . Drug use: No     Allergies   Patient has no known allergies.   Review of Systems Review of Systems  Constitutional: Negative for chills and fever.  HENT: Negative for congestion.   Eyes: Negative for visual disturbance.  Respiratory: Negative for shortness of breath.   Cardiovascular: Negative for chest pain.  Gastrointestinal: Negative for abdominal pain and vomiting.  Genitourinary: Negative for dysuria and flank pain.  Musculoskeletal: Negative for back pain, neck pain and neck stiffness.  Skin: Negative for rash.  Neurological: Positive for weakness, numbness and headaches. Negative for light-headedness.     Physical Exam Updated Vital Signs BP 118/79 (BP Location: Left Arm)   Pulse 90   Temp 98.2 F (36.8 C) (Oral)   Resp 18   Ht 5' 11.5" (1.816 m)   Wt 86.2 kg   SpO2 98%   BMI 26.13 kg/m   Physical Exam Vitals signs and nursing note reviewed.  Constitutional:      Appearance: He is well-developed.  HENT:     Head: Normocephalic and atraumatic.  Eyes:     General:        Right eye: No discharge.        Left eye: No discharge.     Conjunctiva/sclera: Conjunctivae normal.  Neck:     Musculoskeletal: Normal range of motion and neck supple.     Trachea: No tracheal deviation.  Cardiovascular:     Rate and Rhythm: Regular rhythm. Bradycardia present.  Pulmonary:     Effort: Pulmonary effort is normal.     Breath sounds: Normal breath sounds.  Abdominal:     General: There is no distension.     Palpations: Abdomen is soft.     Tenderness: There is no abdominal tenderness. There is no guarding.  Skin:    General: Skin is warm.     Findings: No rash.  Neurological:     Mental Status: He is alert and oriented to person, place,  and time.     GCS: GCS eye subscore is 4. GCS verbal subscore is 5. GCS motor subscore is 6.     Cranial Nerves: No cranial nerve deficit or facial asymmetry.     Coordination: Romberg sign negative.     Comments: Appears clinically intoxicated.  Patient can ambulate on his own and has mild discomfort in the right leg with ambulation.  No focal weakness on exam.  Sensation intact upper and lower extremities bilateral.  Pupils equal horizontal eye movement intact with brief nystagmus horizontal bilateral.  Psychiatric:     Comments: Mild clinical intoxication.      ED Treatments / Results  Labs (all labs ordered are listed, but only abnormal results are displayed) Labs Reviewed  COMPREHENSIVE METABOLIC PANEL - Abnormal; Notable for the following components:      Result Value   Chloride 112 (*)    Glucose, Bld 102 (*)    Creatinine, Ser 1.34 (*)    Calcium 8.8 (*)    GFR calc non Af Amer 58 (*)    All other components within normal limits  ETHANOL - Abnormal; Notable for the following components:   Alcohol, Ethyl (B) 176 (*)    All other components within normal limits  CBC WITH DIFFERENTIAL/PLATELET  MAGNESIUM    EKG None  Radiology Ct Head Wo Contrast  Result Date: 09/29/2019 CLINICAL DATA:  Headaches for 1 week EXAM: CT HEAD WITHOUT CONTRAST TECHNIQUE: Contiguous axial images were obtained from the base of the skull through the vertex without intravenous contrast. COMPARISON:  05/23/2019 FINDINGS: Brain: No evidence of acute infarction, hemorrhage, hydrocephalus, extra-axial collection or mass lesion/mass effect. Vascular: No hyperdense vessel or unexpected calcification. Skull: Normal. Negative for fracture or focal lesion. Sinuses/Orbits: No acute finding. Other: None. IMPRESSION: No acute intracranial abnormality noted. Electronically Signed   By: Inez Catalina M.D.   On: 09/29/2019 00:37   Dg Hip Unilat With Pelvis 2-3 Views Right  Result Date: 09/29/2019 CLINICAL DATA:   Recent fall with right hip pain EXAM: DG HIP (WITH OR WITHOUT PELVIS) 2-3V RIGHT COMPARISON:  None. FINDINGS: Pelvic ring is intact. No acute fracture or dislocation is noted. No soft tissue abnormality is seen. Degenerative changes of the lumbar spine are noted. IMPRESSION: No acute abnormality noted. Electronically Signed   By: Inez Catalina M.D.   On: 09/29/2019 00:48    Procedures Procedures (including critical care time)  Medications Ordered in ED Medications  acetaminophen (TYLENOL) tablet 1,000 mg (1,000 mg Oral Given 09/29/19 0011)  sodium chloride 0.9 % bolus 500 mL (0 mLs Intravenous  Stopped 09/29/19 0053)  thiamine (VITAMIN B-1) tablet 100 mg (100 mg Oral Given 09/29/19 0011)     Initial Impression / Assessment and Plan / ED Course  I have reviewed the triage vital signs and the nursing notes.  Pertinent labs & imaging results that were available during my care of the patient were reviewed by me and considered in my medical decision making (see chart for details).       Patient with alcohol and high blood pressure history presents with headache overall similar to previous along with right hip/leg and right arm discomfort.  Patient has no focal weakness, patient does have pain with lifting his right leg.  Discussed plan for CT scan of the head, blood work, thiamine and reassessment. Creatinine 1.3, ethanol 176.  Patient not clinically intoxicated on reassessment.  Patient calling for a ride home.  CT scans no acute findings.  Patient stable for outpatient follow-up with primary care doctor.  Patient feels improved, pain improved, heart rate improved. Final Clinical Impressions(s) / ED Diagnoses   Final diagnoses:  Chronic right hip pain  Acute nonintractable headache, unspecified headache type  Alcohol use    ED Discharge Orders    None       Elnora Morrison, MD 09/29/19 [phone]

## 2019-09-29 ENCOUNTER — Emergency Department (HOSPITAL_COMMUNITY): Payer: BLUE CROSS/BLUE SHIELD

## 2019-09-29 LAB — MAGNESIUM: Magnesium: 2.2 mg/dL (ref 1.7–2.4)

## 2019-09-29 LAB — CBC WITH DIFFERENTIAL/PLATELET
Abs Immature Granulocytes: 0.02 10*3/uL (ref 0.00–0.07)
Basophils Absolute: 0 10*3/uL (ref 0.0–0.1)
Basophils Relative: 1 %
Eosinophils Absolute: 0.1 10*3/uL (ref 0.0–0.5)
Eosinophils Relative: 2 %
HCT: 40.1 % (ref 39.0–52.0)
Hemoglobin: 13.6 g/dL (ref 13.0–17.0)
Immature Granulocytes: 0 %
Lymphocytes Relative: 57 %
Lymphs Abs: 3.6 10*3/uL (ref 0.7–4.0)
MCH: 32.2 pg (ref 26.0–34.0)
MCHC: 33.9 g/dL (ref 30.0–36.0)
MCV: 95 fL (ref 80.0–100.0)
Monocytes Absolute: 0.5 10*3/uL (ref 0.1–1.0)
Monocytes Relative: 8 %
Neutro Abs: 2 10*3/uL (ref 1.7–7.7)
Neutrophils Relative %: 32 %
Platelets: 347 10*3/uL (ref 150–400)
RBC: 4.22 MIL/uL (ref 4.22–5.81)
RDW: 12.6 % (ref 11.5–15.5)
WBC: 6.3 10*3/uL (ref 4.0–10.5)
nRBC: 0 % (ref 0.0–0.2)

## 2019-09-29 LAB — COMPREHENSIVE METABOLIC PANEL
ALT: 28 U/L (ref 0–44)
AST: 36 U/L (ref 15–41)
Albumin: 4 g/dL (ref 3.5–5.0)
Alkaline Phosphatase: 60 U/L (ref 38–126)
Anion gap: 10 (ref 5–15)
BUN: 18 mg/dL (ref 6–20)
CO2: 23 mmol/L (ref 22–32)
Calcium: 8.8 mg/dL — ABNORMAL LOW (ref 8.9–10.3)
Chloride: 112 mmol/L — ABNORMAL HIGH (ref 98–111)
Creatinine, Ser: 1.34 mg/dL — ABNORMAL HIGH (ref 0.61–1.24)
GFR calc Af Amer: >60 mL/min (ref >60)
GFR calc non Af Amer: 58 mL/min — ABNORMAL LOW (ref >60)
Glucose, Bld: 102 mg/dL — ABNORMAL HIGH (ref 70–99)
Potassium: 3.8 mmol/L (ref 3.5–5.1)
Sodium: 145 mmol/L (ref 135–145)
Total Bilirubin: 0.3 mg/dL (ref 0.3–1.2)
Total Protein: 6.8 g/dL (ref 6.5–8.1)

## 2019-09-29 LAB — ETHANOL: Alcohol, Ethyl (B): 176 mg/dL — ABNORMAL HIGH (ref <10)

## 2019-09-29 IMAGING — CT CT HEAD W/O CM
3 series · 15 of 47 positions shown, 18 images · non-contrast
Comparison: [DATE]

CLINICAL DATA: Headaches for 1 week

EXAM:
CT HEAD WITHOUT CONTRAST
TECHNIQUE: Contiguous axial images were obtained from the base of the skull
through the vertex without intravenous contrast.

[Series 2: head w o · axial · 0.48mm/px · z∈[-18,+112]mm · 9 of 32 slices shown, 12 images]
[im 3/32  brain]
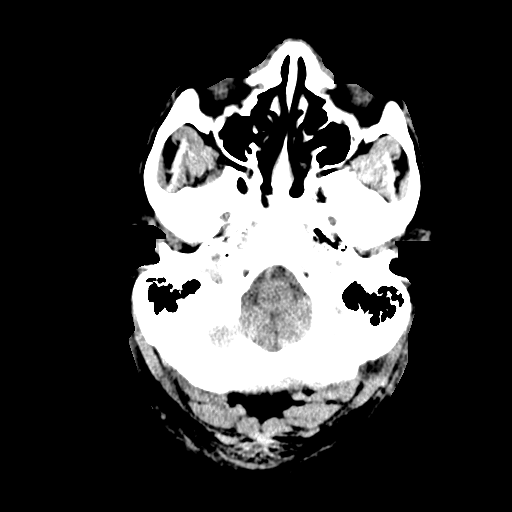
[im 3/32  bone]
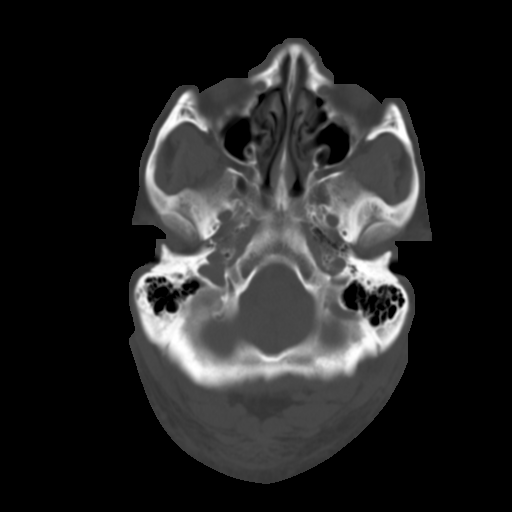
[im 6/32  brain]
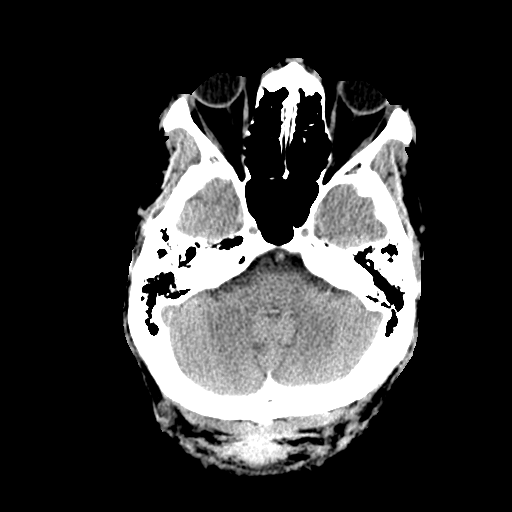
[im 9/32  brain]
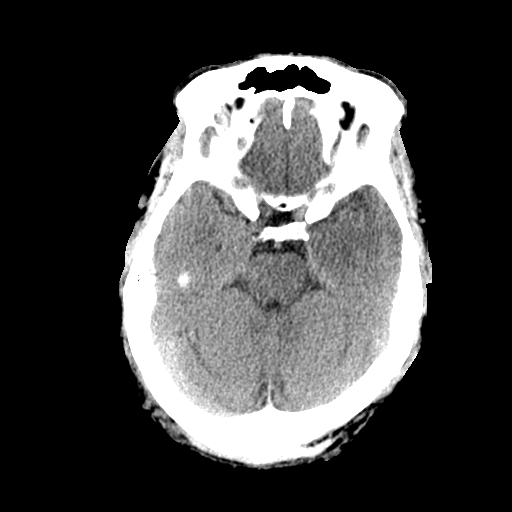
[im 12/32  brain]
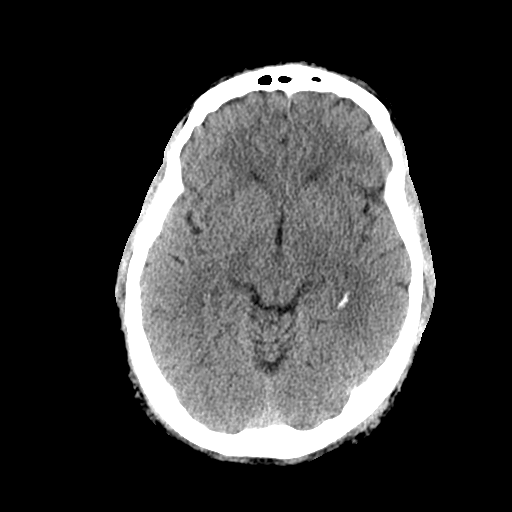
[im 17/32  brain]
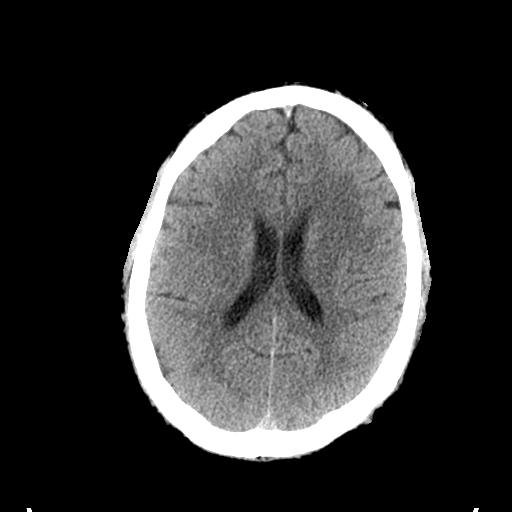
[im 17/32  bone]
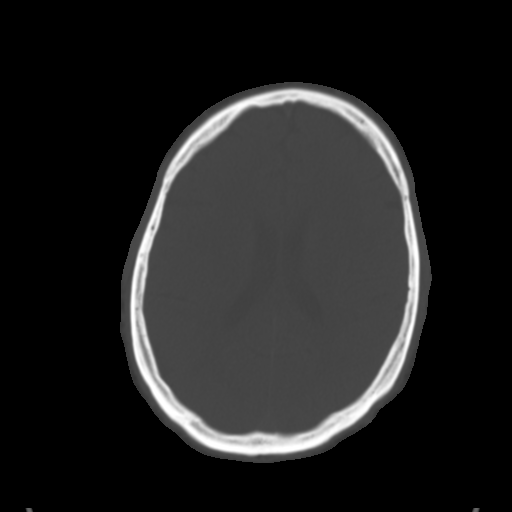
[im 20/32  brain]
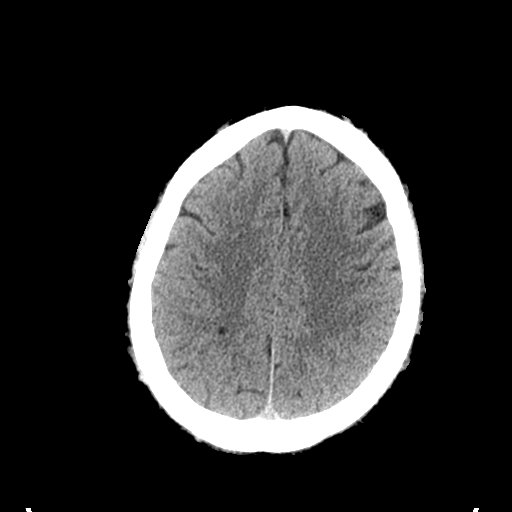
[im 23/32  brain]
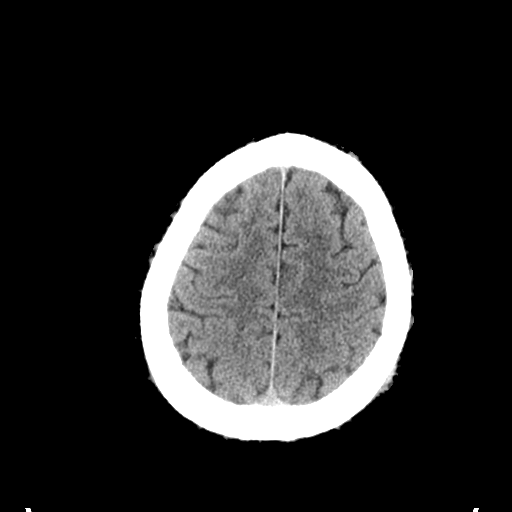
[im 26/32  brain]
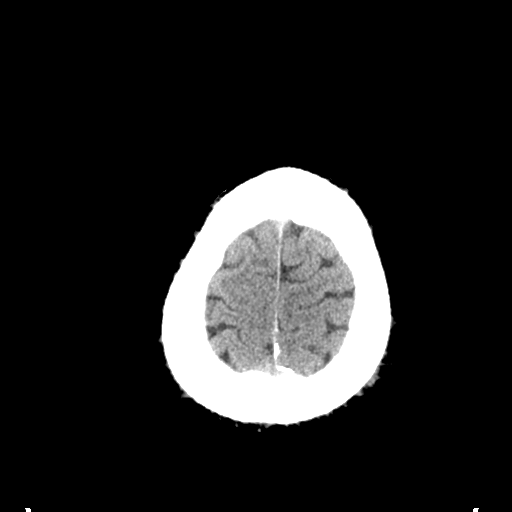
[im 29/32  brain]
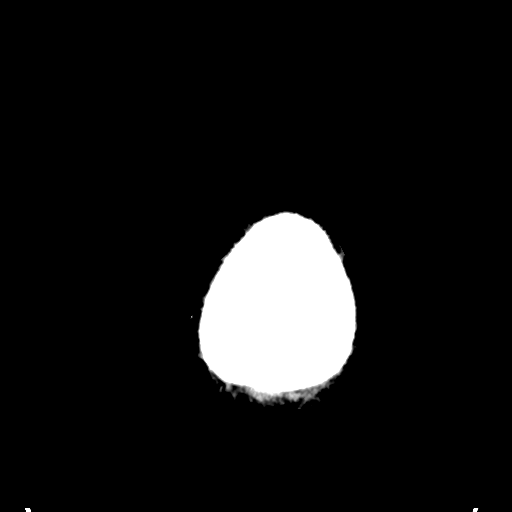
[im 29/32  bone]
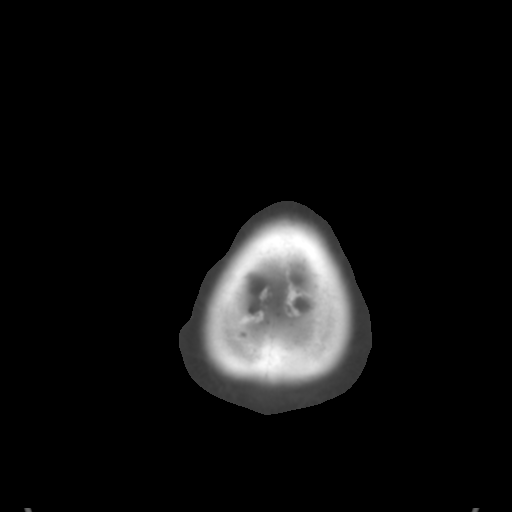

[Series 4: coronal soft · coronal · 0.34mm/px · 3 of 72 slices shown]
[im 24/72  brain]
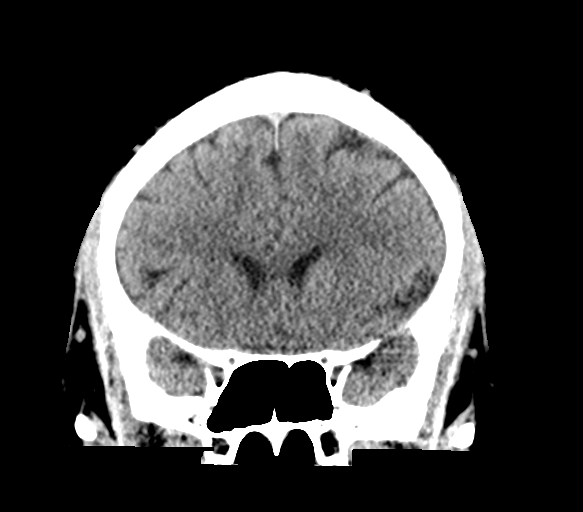
[im 32/72  brain]
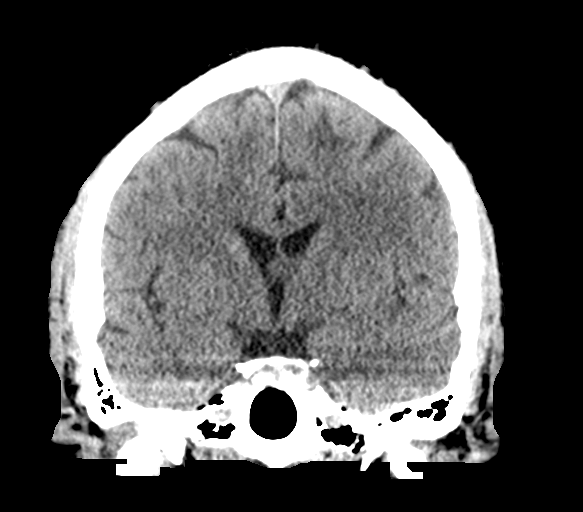
[im 40/72  brain]
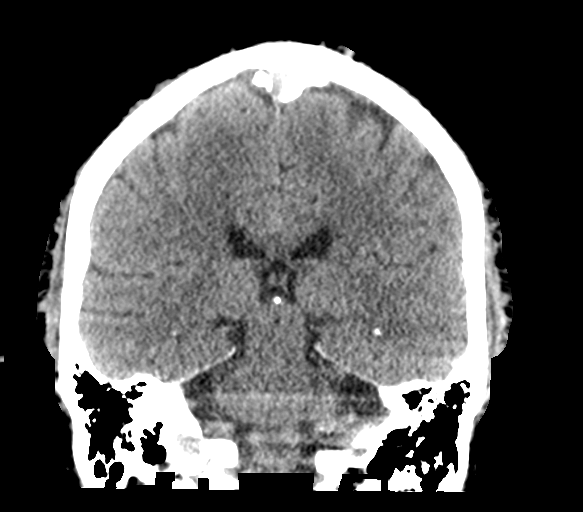

[Series 5: sagittal soft · sagittal · 0.36mm/px · 3 of 59 slices shown]
[im 20/59  brain]
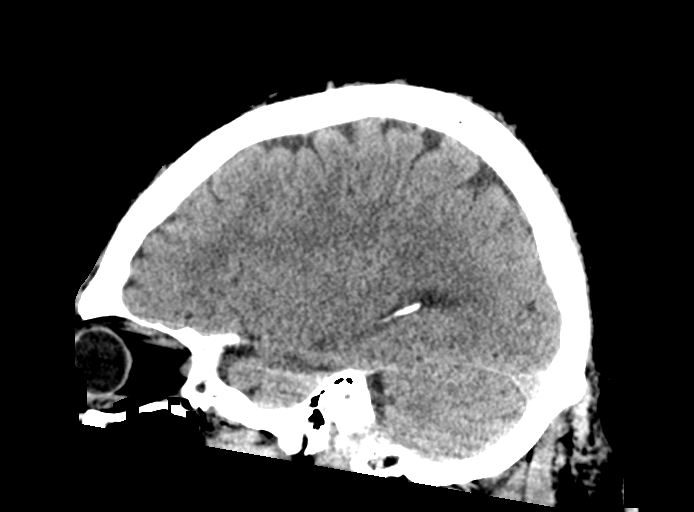
[im 30/59  brain]
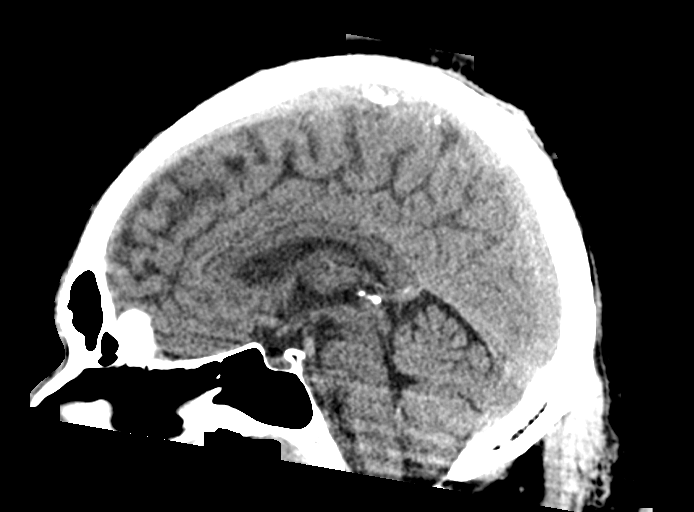
[im 39/59  brain]
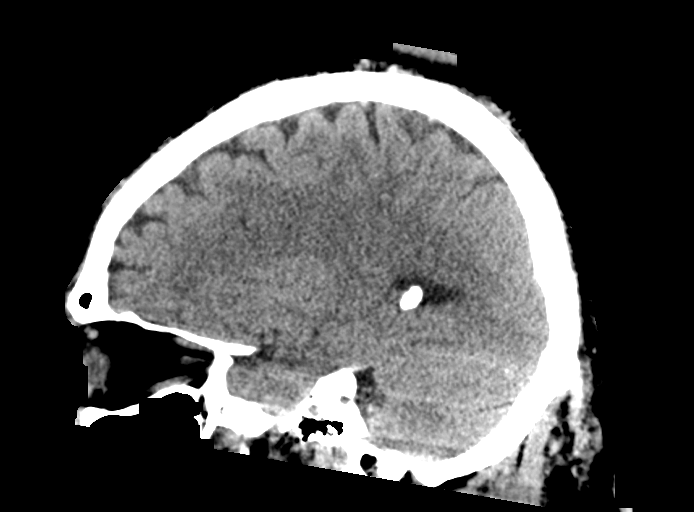

[15 of 47 positions shown; findings below may reference images not displayed]

FINDINGS: Brain: No evidence of acute infarction, hemorrhage, hydrocephalus,
extra-axial collection or mass lesion/mass effect.

Vascular: No hyperdense vessel or unexpected calcification.

Skull: Normal. Negative for fracture or focal lesion.

Sinuses/Orbits: No acute finding.

Other: None.
IMPRESSION: No acute intracranial abnormality noted.

## 2019-09-29 IMAGING — DX DG HIP (WITH OR WITHOUT PELVIS) 2-3V*R*
3 series · 3 of 3 positions shown · non-contrast
Comparison: None.

CLINICAL DATA: Recent fall with right hip pain

EXAM:
DG HIP (WITH OR WITHOUT PELVIS) 2-3V RIGHT

[pelvis ap]
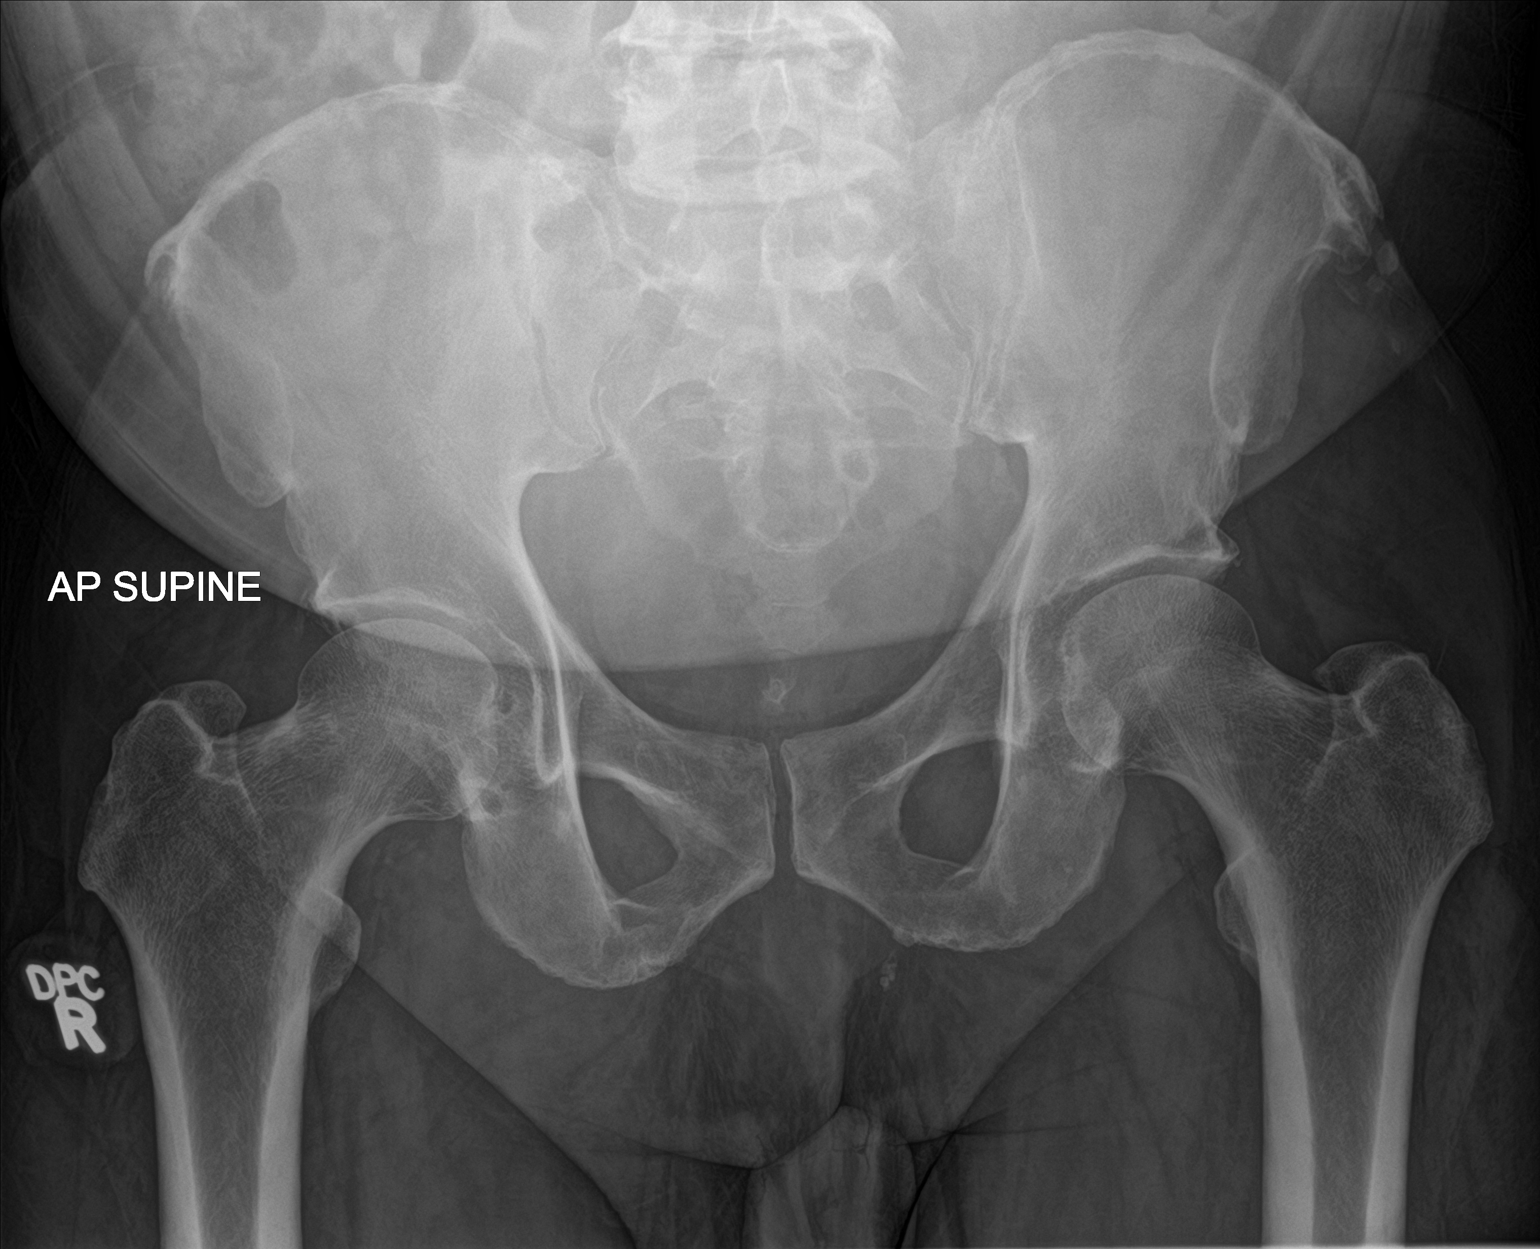

[hip ap]
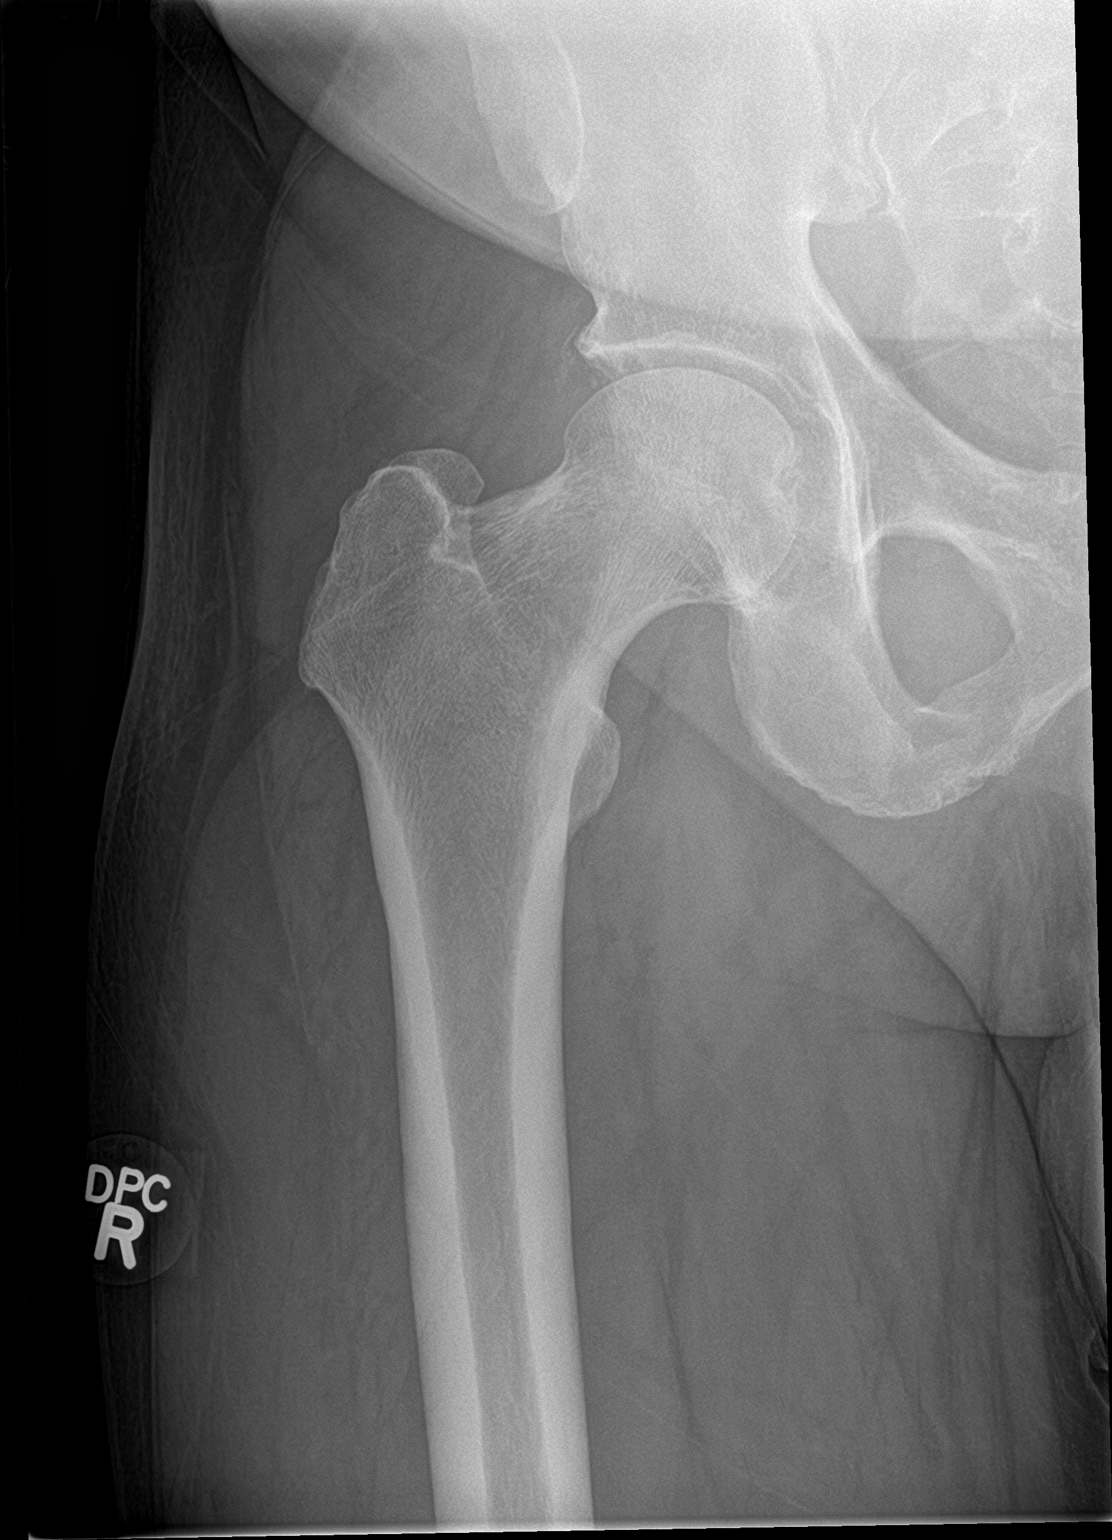

[hip lat]
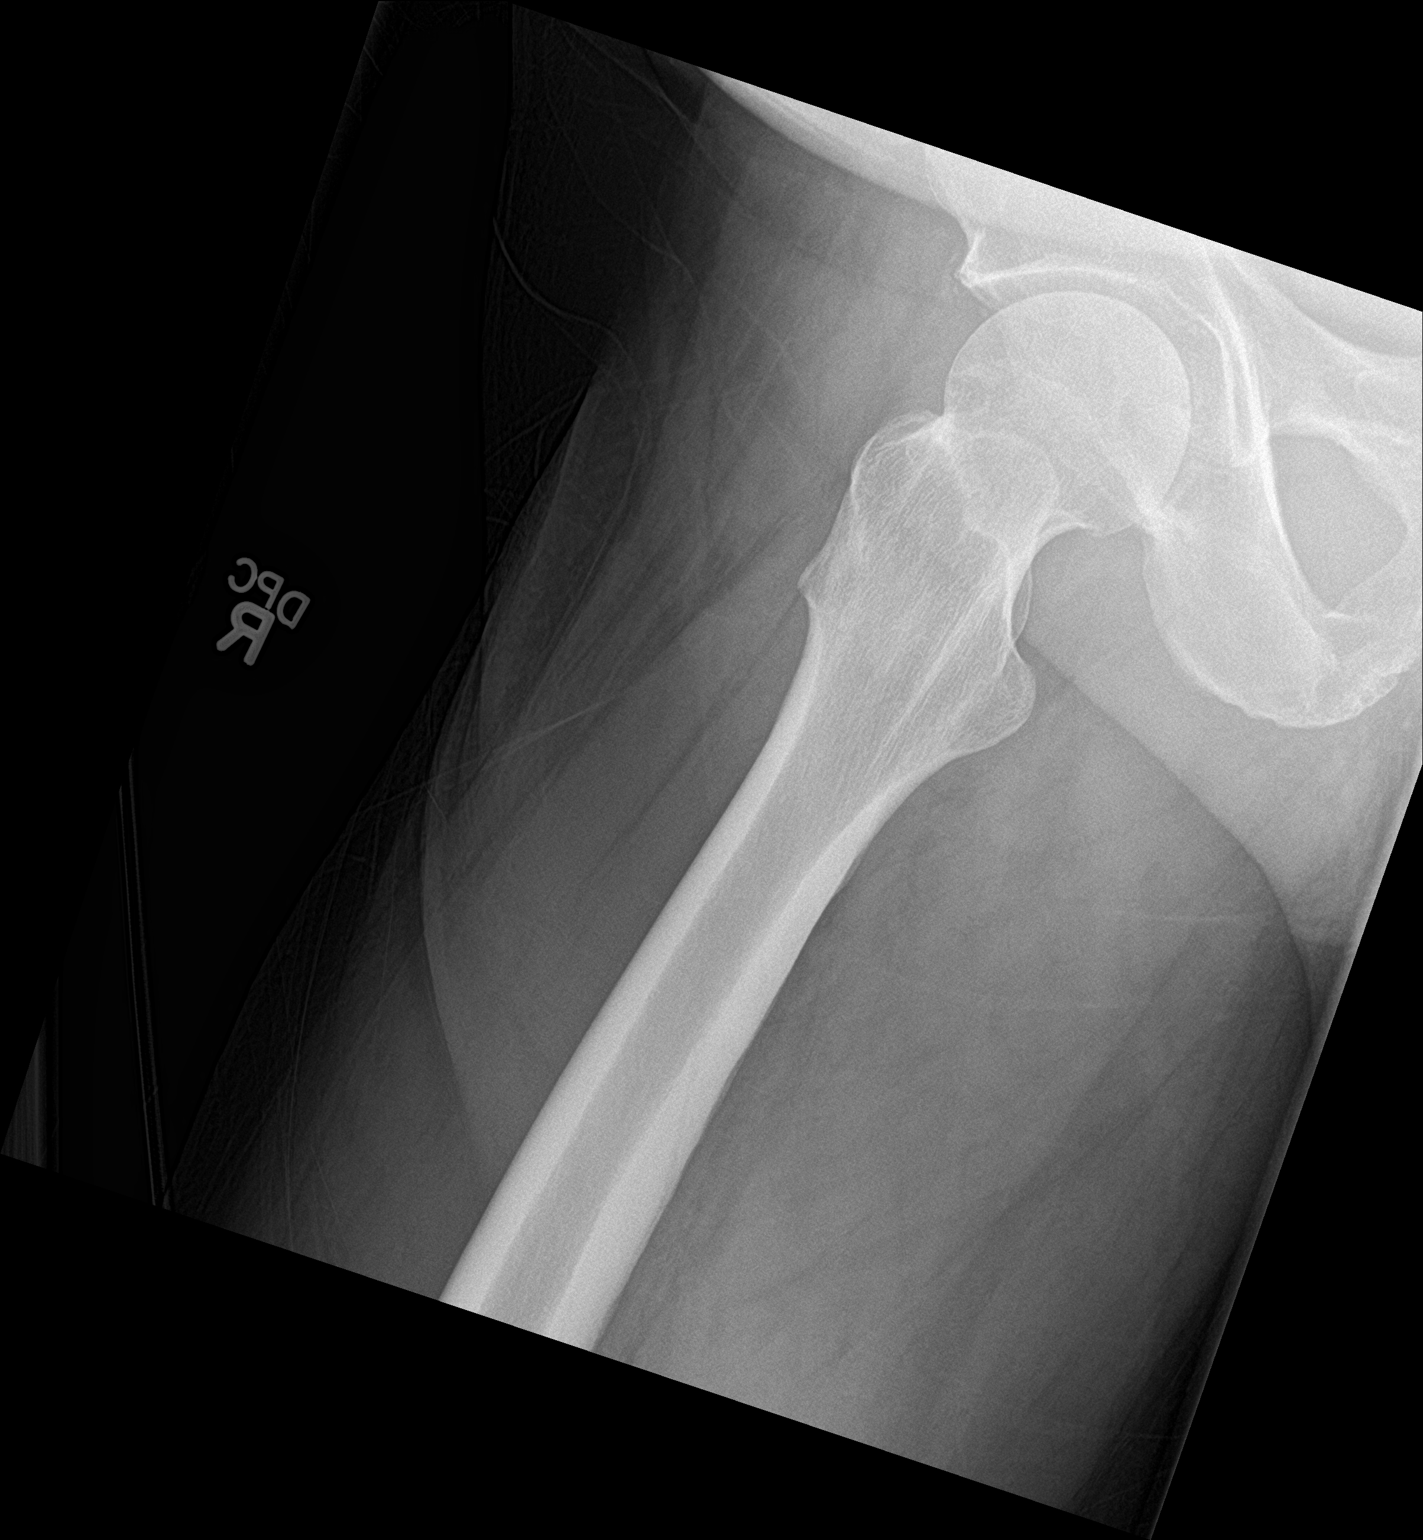

[3 of 3 positions shown; findings below may reference images not displayed]

FINDINGS: Pelvic ring is intact. No acute fracture or dislocation is noted. No
soft tissue abnormality is seen. Degenerative changes of the lumbar
spine are noted.
IMPRESSION: No acute abnormality noted.

## 2019-09-29 NOTE — Discharge Instructions (Signed)
Please follow-up with your primary doctor.  Try to decrease alcohol use. If you develop worsening right-sided symptoms especially weakness you will need an MRI of your brain. Take Tylenol as needed for headache.

## 2019-09-29 NOTE — ED Notes (Signed)
Pt tolerated PO Fluids without complication 

## 2019-11-03 DIAGNOSIS — G459 Transient cerebral ischemic attack, unspecified: Secondary | ICD-10-CM

## 2019-11-03 HISTORY — DX: Transient cerebral ischemic attack, unspecified: G45.9

## 2020-02-03 ENCOUNTER — Other Ambulatory Visit: Payer: Self-pay

## 2020-02-03 ENCOUNTER — Emergency Department (HOSPITAL_COMMUNITY)
Admission: EM | Admit: 2020-02-03 | Discharge: 2020-02-03 | Disposition: A | Discharge status: Home / Self Care | Payer: BLUE CROSS/BLUE SHIELD | Attending: Emergency Medicine | Admitting: Emergency Medicine

## 2020-02-03 ENCOUNTER — Encounter (HOSPITAL_COMMUNITY): Payer: Self-pay

## 2020-02-03 DIAGNOSIS — M5431 Sciatica, right side: Secondary | ICD-10-CM | POA: Insufficient documentation

## 2020-02-03 DIAGNOSIS — I10 Essential (primary) hypertension: Secondary | ICD-10-CM | POA: Insufficient documentation

## 2020-02-03 DIAGNOSIS — F17210 Nicotine dependence, cigarettes, uncomplicated: Secondary | ICD-10-CM | POA: Insufficient documentation

## 2020-02-03 DIAGNOSIS — G8929 Other chronic pain: Secondary | ICD-10-CM | POA: Insufficient documentation

## 2020-02-03 HISTORY — DX: Unspecified osteoarthritis, unspecified site: M19.90

## 2020-02-03 HISTORY — DX: Laceration without foreign body of abdominal wall, unspecified quadrant without penetration into peritoneal cavity, initial encounter: S31.119A

## 2020-02-03 MED ORDER — PREDNISONE 10 MG PO TABS: ORAL_TABLET | ORAL | 0 refills | Status: DC

## 2020-02-03 MED ORDER — METAXALONE 400 MG PO TABS: 800.0000 mg | ORAL_TABLET | Freq: Three times a day (TID) | ORAL | 0 refills | Status: DC

## 2020-02-03 MED ORDER — HYDROCODONE-ACETAMINOPHEN 5-325 MG PO TABS: 1.0000 | ORAL_TABLET | Freq: Four times a day (QID) | ORAL | 0 refills | Status: DC | PRN

## 2020-02-03 MED ORDER — METHOCARBAMOL 500 MG PO TABS
500.0000 mg | ORAL_TABLET | Freq: Once | ORAL | Status: AC
  Administered 2020-02-03: 20:00:00 500 mg via ORAL
  Filled 2020-02-03: qty 1

## 2020-02-03 MED ORDER — HYDROCODONE-ACETAMINOPHEN 5-325 MG PO TABS
1.0000 | ORAL_TABLET | Freq: Once | ORAL | Status: AC
  Administered 2020-02-03: 21:00:00 1 via ORAL
  Filled 2020-02-03: qty 1

## 2020-02-03 MED ORDER — DEXAMETHASONE SODIUM PHOSPHATE 10 MG/ML IJ SOLN
10.0000 mg | Freq: Once | INTRAMUSCULAR | Status: AC
  Administered 2020-02-03: 20:00:00 10 mg via INTRAMUSCULAR
  Filled 2020-02-03: qty 1
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
## 2020-02-03 NOTE — Discharge Instructions (Addendum)
Take your next dose of prednisone tomorrow morning.  Use the the other medicines as directed.  Do not drive within 4 hours of taking hydrocodone as this will make you drowsy.  Avoid lifting,  Bending,  Twisting or any other activity that worsens your pain over the next week.  Apply a heating pad to your lower back for 20 minutes several times daily.  You should get rechecked if your symptoms are not better over the next 5 days,  Or you develop increased pain,  Weakness in your leg(s) or loss of bladder or bowel function - these can be symptoms of a worsening condition.

## 2020-02-03 NOTE — ED Triage Notes (Signed)
Pt c/o mid back pain that has been going on for a while. States that it makes his right leg give out . Michela Pitcher it happened 3 times today.

## 2020-02-03 NOTE — ED Notes (Signed)
ED Provider at bedside. 

## 2020-08-28 ENCOUNTER — Emergency Department (HOSPITAL_COMMUNITY): Payer: 59

## 2020-08-28 ENCOUNTER — Observation Stay (HOSPITAL_COMMUNITY): Payer: 59

## 2020-08-28 ENCOUNTER — Encounter (HOSPITAL_COMMUNITY): Payer: Self-pay | Admitting: Internal Medicine

## 2020-08-28 ENCOUNTER — Observation Stay (HOSPITAL_COMMUNITY)
Admission: EM | Admit: 2020-08-28 | Discharge: 2020-08-29 | Disposition: A | Discharge status: Home / Self Care | Payer: 59 | Attending: Student | Admitting: Student

## 2020-08-28 DIAGNOSIS — I639 Cerebral infarction, unspecified: Secondary | ICD-10-CM | POA: Diagnosis not present

## 2020-08-28 DIAGNOSIS — F17210 Nicotine dependence, cigarettes, uncomplicated: Secondary | ICD-10-CM | POA: Insufficient documentation

## 2020-08-28 DIAGNOSIS — F10129 Alcohol abuse with intoxication, unspecified: Secondary | ICD-10-CM | POA: Diagnosis not present

## 2020-08-28 DIAGNOSIS — I10 Essential (primary) hypertension: Secondary | ICD-10-CM | POA: Diagnosis present

## 2020-08-28 DIAGNOSIS — R55 Syncope and collapse: Secondary | ICD-10-CM | POA: Diagnosis present

## 2020-08-28 DIAGNOSIS — Z79899 Other long term (current) drug therapy: Secondary | ICD-10-CM | POA: Diagnosis not present

## 2020-08-28 DIAGNOSIS — F10920 Alcohol use, unspecified with intoxication, uncomplicated: Secondary | ICD-10-CM

## 2020-08-28 DIAGNOSIS — Z20822 Contact with and (suspected) exposure to covid-19: Secondary | ICD-10-CM | POA: Diagnosis not present

## 2020-08-28 DIAGNOSIS — M25539 Pain in unspecified wrist: Secondary | ICD-10-CM

## 2020-08-28 DIAGNOSIS — R531 Weakness: Secondary | ICD-10-CM | POA: Diagnosis present

## 2020-08-28 LAB — CBC
HCT: 44.2 % (ref 39.0–52.0)
HCT: 45.2 % (ref 39.0–52.0)
Hemoglobin: 14.8 g/dL (ref 13.0–17.0)
Hemoglobin: 15.1 g/dL (ref 13.0–17.0)
MCH: 31.6 pg (ref 26.0–34.0)
MCH: 31.9 pg (ref 26.0–34.0)
MCHC: 33.5 g/dL (ref 30.0–36.0)
MCHC: 33.4 g/dL (ref 30.0–36.0)
MCV: 94.4 fL (ref 80.0–100.0)
MCV: 95.4 fL (ref 80.0–100.0)
Platelets: 360 10*3/uL (ref 150–400)
Platelets: 368 10*3/uL (ref 150–400)
RBC: 4.68 MIL/uL (ref 4.22–5.81)
RBC: 4.74 MIL/uL (ref 4.22–5.81)
RDW: 12.6 % (ref 11.5–15.5)
RDW: 12.7 % (ref 11.5–15.5)
WBC: 7.3 10*3/uL (ref 4.0–10.5)
WBC: 7.3 10*3/uL (ref 4.0–10.5)
nRBC: 0 % (ref 0.0–0.2)
nRBC: 0 % (ref 0.0–0.2)

## 2020-08-28 LAB — RESPIRATORY PANEL BY RT PCR (FLU A&B, COVID)
Influenza A by PCR: NEGATIVE
Influenza B by PCR: NEGATIVE
SARS Coronavirus 2 by RT PCR: NEGATIVE

## 2020-08-28 LAB — COMPREHENSIVE METABOLIC PANEL
ALT: 40 U/L (ref 0–44)
AST: 58 U/L — ABNORMAL HIGH (ref 15–41)
Albumin: 4.2 g/dL (ref 3.5–5.0)
Alkaline Phosphatase: 71 U/L (ref 38–126)
Anion gap: 11 (ref 5–15)
BUN: 6 mg/dL (ref 6–20)
CO2: 27 mmol/L (ref 22–32)
Calcium: 9.7 mg/dL (ref 8.9–10.3)
Chloride: 102 mmol/L (ref 98–111)
Creatinine, Ser: 0.99 mg/dL (ref 0.61–1.24)
GFR, Estimated: >60 mL/min (ref >60)
Glucose, Bld: 102 mg/dL — ABNORMAL HIGH (ref 70–99)
Potassium: 4.1 mmol/L (ref 3.5–5.1)
Sodium: 140 mmol/L (ref 135–145)
Total Bilirubin: 0.8 mg/dL (ref 0.3–1.2)
Total Protein: 7 g/dL (ref 6.5–8.1)

## 2020-08-28 LAB — I-STAT CHEM 8, ED
BUN: 7 mg/dL (ref 6–20)
Calcium, Ion: 1.08 mmol/L — ABNORMAL LOW (ref 1.15–1.40)
Chloride: 102 mmol/L (ref 98–111)
Creatinine, Ser: 1.3 mg/dL — ABNORMAL HIGH (ref 0.61–1.24)
Glucose, Bld: 100 mg/dL — ABNORMAL HIGH (ref 70–99)
HCT: 46 % (ref 39.0–52.0)
Hemoglobin: 15.6 g/dL (ref 13.0–17.0)
Potassium: 4 mmol/L (ref 3.5–5.1)
Sodium: 138 mmol/L (ref 135–145)
TCO2: 27 mmol/L (ref 22–32)

## 2020-08-28 LAB — CREATININE, SERUM
Creatinine, Ser: 0.93 mg/dL (ref 0.61–1.24)
GFR, Estimated: >60 mL/min (ref >60)

## 2020-08-28 LAB — URINALYSIS, ROUTINE W REFLEX MICROSCOPIC
Bacteria, UA: NONE SEEN
Bilirubin Urine: NEGATIVE
Glucose, UA: NEGATIVE mg/dL
Ketones, ur: NEGATIVE mg/dL
Leukocytes,Ua: NEGATIVE
Nitrite: NEGATIVE
Protein, ur: NEGATIVE mg/dL
Specific Gravity, Urine: 1.004 — ABNORMAL LOW (ref 1.005–1.030)
pH: 7 (ref 5.0–8.0)

## 2020-08-28 LAB — DIFFERENTIAL
Abs Immature Granulocytes: 0.03 10*3/uL (ref 0.00–0.07)
Basophils Absolute: 0.1 10*3/uL (ref 0.0–0.1)
Basophils Relative: 1 %
Eosinophils Absolute: 0.2 10*3/uL (ref 0.0–0.5)
Eosinophils Relative: 2 %
Immature Granulocytes: 0 %
Lymphocytes Relative: 39 %
Lymphs Abs: 2.8 10*3/uL (ref 0.7–4.0)
Monocytes Absolute: 0.7 10*3/uL (ref 0.1–1.0)
Monocytes Relative: 9 %
Neutro Abs: 3.5 10*3/uL (ref 1.7–7.7)
Neutrophils Relative %: 49 %

## 2020-08-28 LAB — APTT: aPTT: 29 seconds (ref 24–36)

## 2020-08-28 LAB — RAPID URINE DRUG SCREEN, HOSP PERFORMED
Amphetamines: NOT DETECTED
Barbiturates: NOT DETECTED
Benzodiazepines: NOT DETECTED
Cocaine: NOT DETECTED
Opiates: NOT DETECTED
Tetrahydrocannabinol: NOT DETECTED

## 2020-08-28 LAB — ETHANOL: Alcohol, Ethyl (B): 158 mg/dL — ABNORMAL HIGH (ref <10)

## 2020-08-28 LAB — CBG MONITORING, ED: Glucose-Capillary: 90 mg/dL (ref 70–99)

## 2020-08-28 LAB — PROTIME-INR
INR: 1 (ref 0.8–1.2)
Prothrombin Time: 12.7 seconds (ref 11.4–15.2)

## 2020-08-28 IMAGING — CT CT ANGIO NECK
2 of 7 series · 8 of 33 positions shown · IV contrast (APPLIED)
Comparison: None.

CLINICAL DATA: Code stroke follow-up

EXAM:
CT ANGIOGRAPHY HEAD AND NECK
TECHNIQUE: Multidetector CT imaging of the head and neck was performed using
the standard protocol during bolus administration of intravenous
contrast. Multiplanar CT image reconstructions and MIPs were
obtained to evaluate the vascular anatomy. Carotid stenosis
measurements (when applicable) are obtained utilizing NASCET
criteria, using the distal internal carotid diameter as the
denominator.
CONTRAST:  75 mL Omnipaque 350

[Series 5: cta neck/head · axial · 0.59mm/px · z∈[-285,-149]mm · 2 of 204 slices shown]
[im 68/204  soft-tissue]
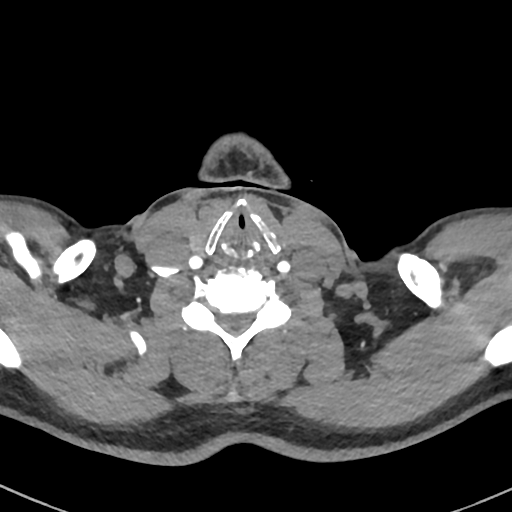
[im 136/204  soft-tissue]
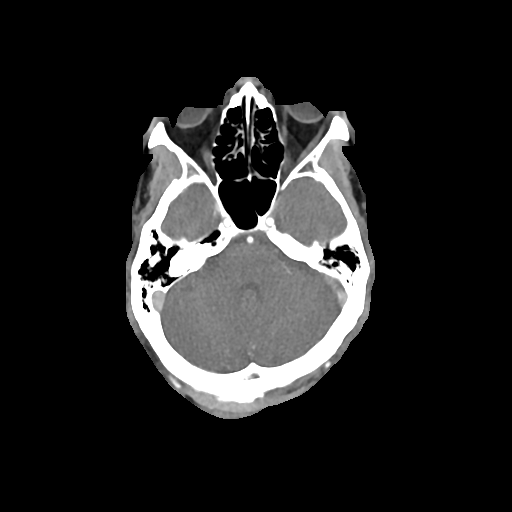

[Series 7: ax thins · axial · 0.47mm/px · z∈[-362,-73]mm · 6 of 405 slices shown]
[im 58/405  soft-tissue]
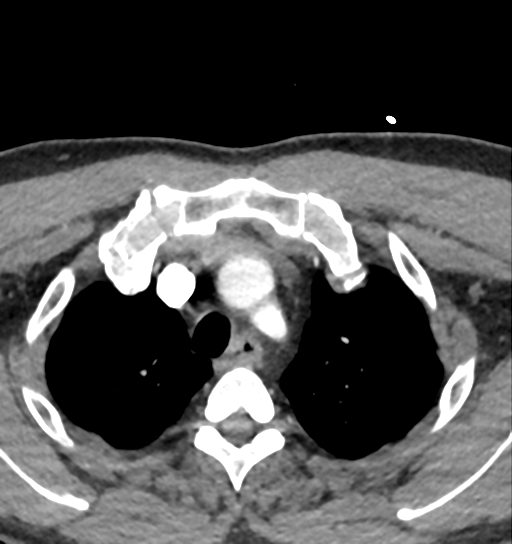
[im 116/405  bone]
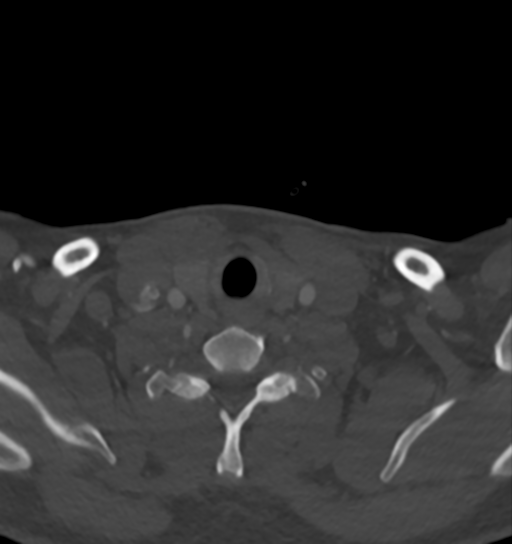
[im 174/405  soft-tissue]
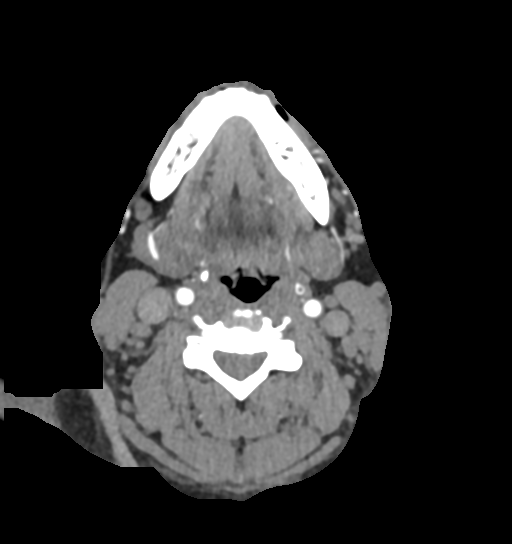
[im 231/405  bone]
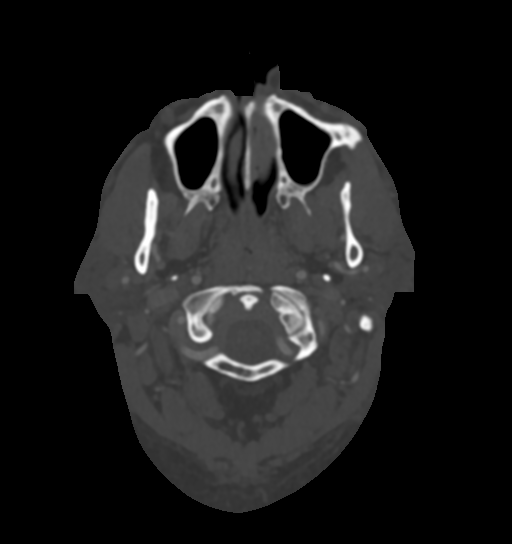
[im 289/405  soft-tissue]
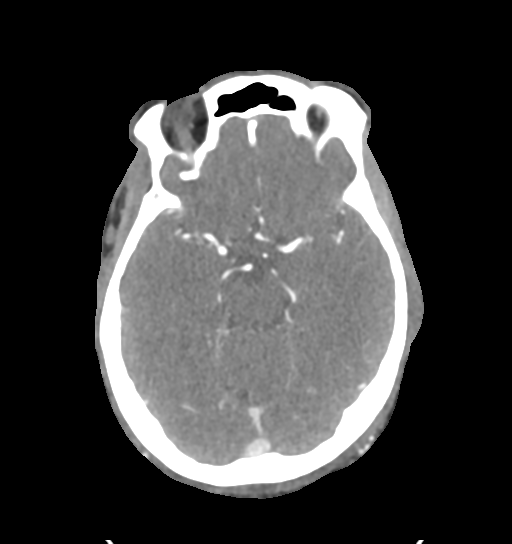
[im 347/405  bone]
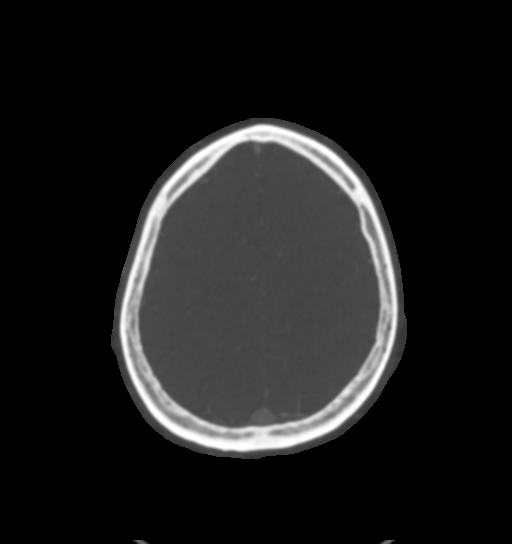

[8 of 33 positions shown; findings below may reference images not displayed]

FINDINGS: CTA NECK

Aortic arch: Great vessel origins are patent.

Right carotid system: Patent. Primarily calcified plaque at the
common carotid bifurcation and proximal internal carotid causing
minimal stenosis.

Left carotid system: Patent. Mild atherosclerotic irregularity of
the common carotid. Mild calcified plaque at the common carotid
bifurcation. No measurable stenosis at the ICA origin.

Vertebral arteries: Patent. Right vertebral artery is minimally
dominant. No measurable stenosis or evidence of dissection.

Skeleton: Cervical spine degenerative changes primarily at C5-C6 and
C6-C7.

Other neck: No mass or adenopathy.

Upper chest: No apical lung mass.

Review of the MIP images confirms the above findings

CTA HEAD

Anterior circulation: Intracranial internal carotid arteries are
patent. Anterior and middle cerebral arteries are patent. Mild
atherosclerosis of the left M1 MCA.

Posterior circulation: Intracranial vertebral arteries, basilar
artery, and posterior cerebral arteries are patent. There are
bilateral posterior communicating arteries present.

Venous sinuses: Patent as allowed by contrast bolus timing.

Review of the MIP images confirms the above findings
IMPRESSION: No large vessel occlusion or hemodynamically significant stenosis.

Mild left M1 MCA atherosclerosis.

## 2020-08-28 IMAGING — CR DG WRIST 2V*R*
2 series · 2 of 2 positions shown · non-contrast
Comparison: None.

CLINICAL DATA: Wrist pain

EXAM:
RIGHT WRIST - 2 VIEW

[wrist pa]
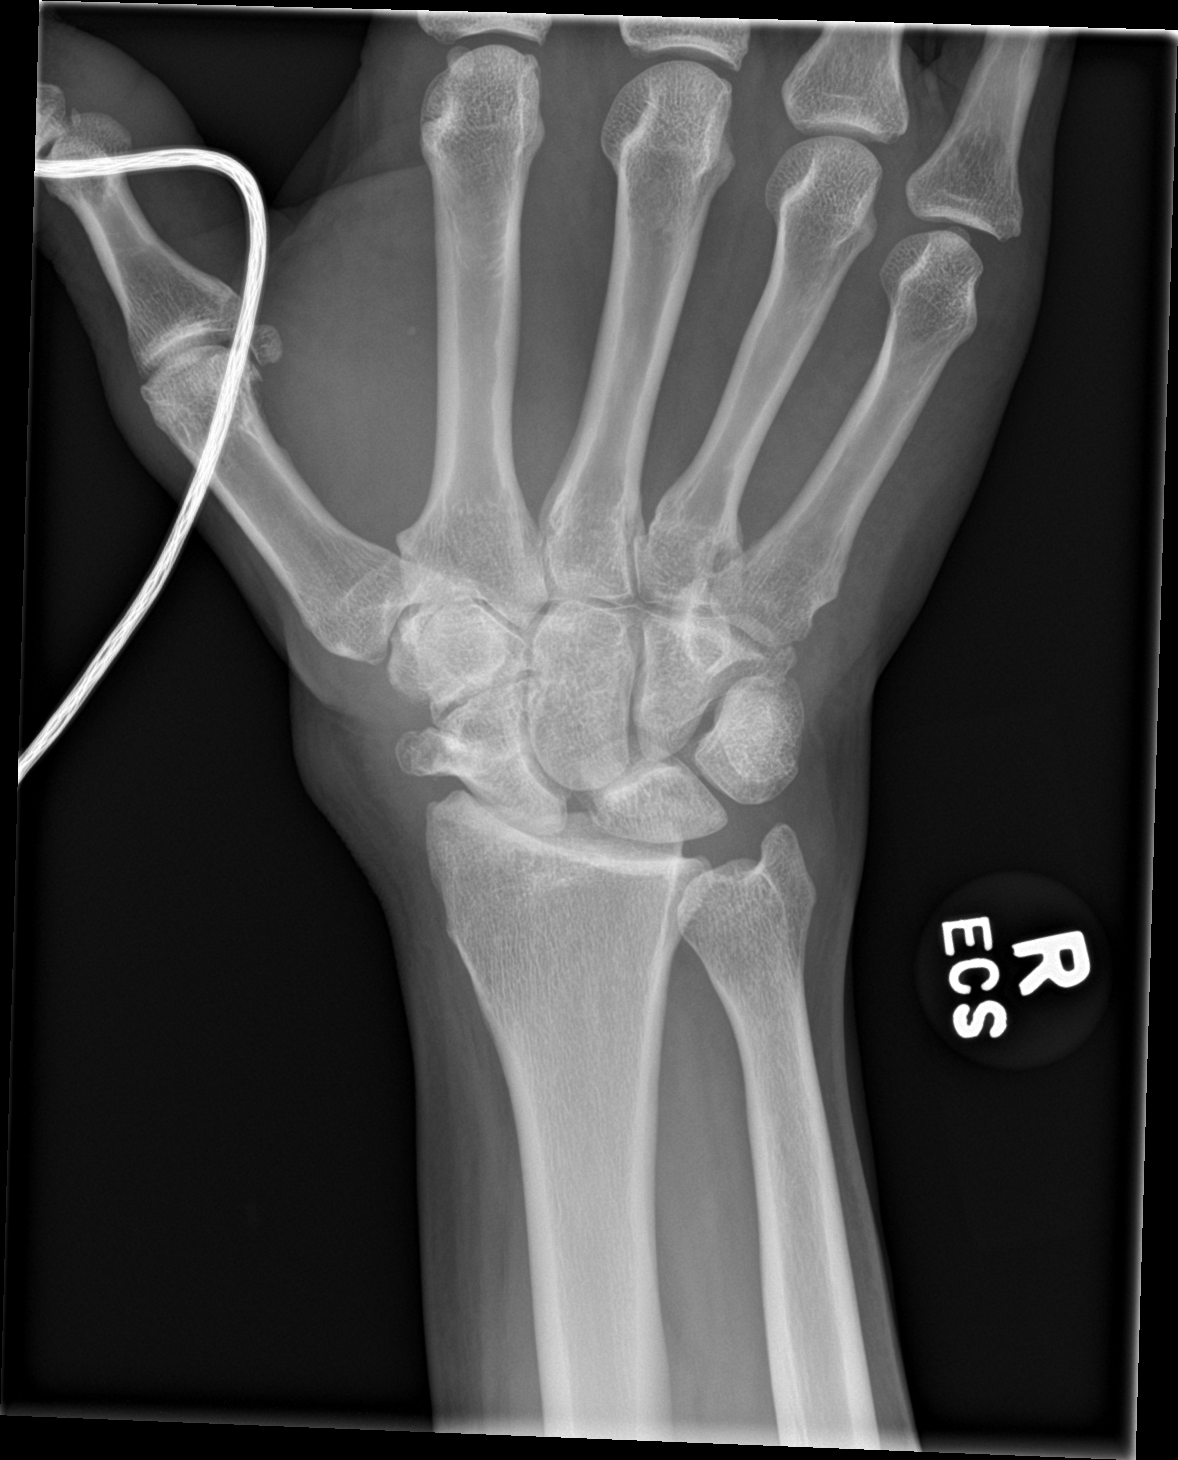

[wrist lat]
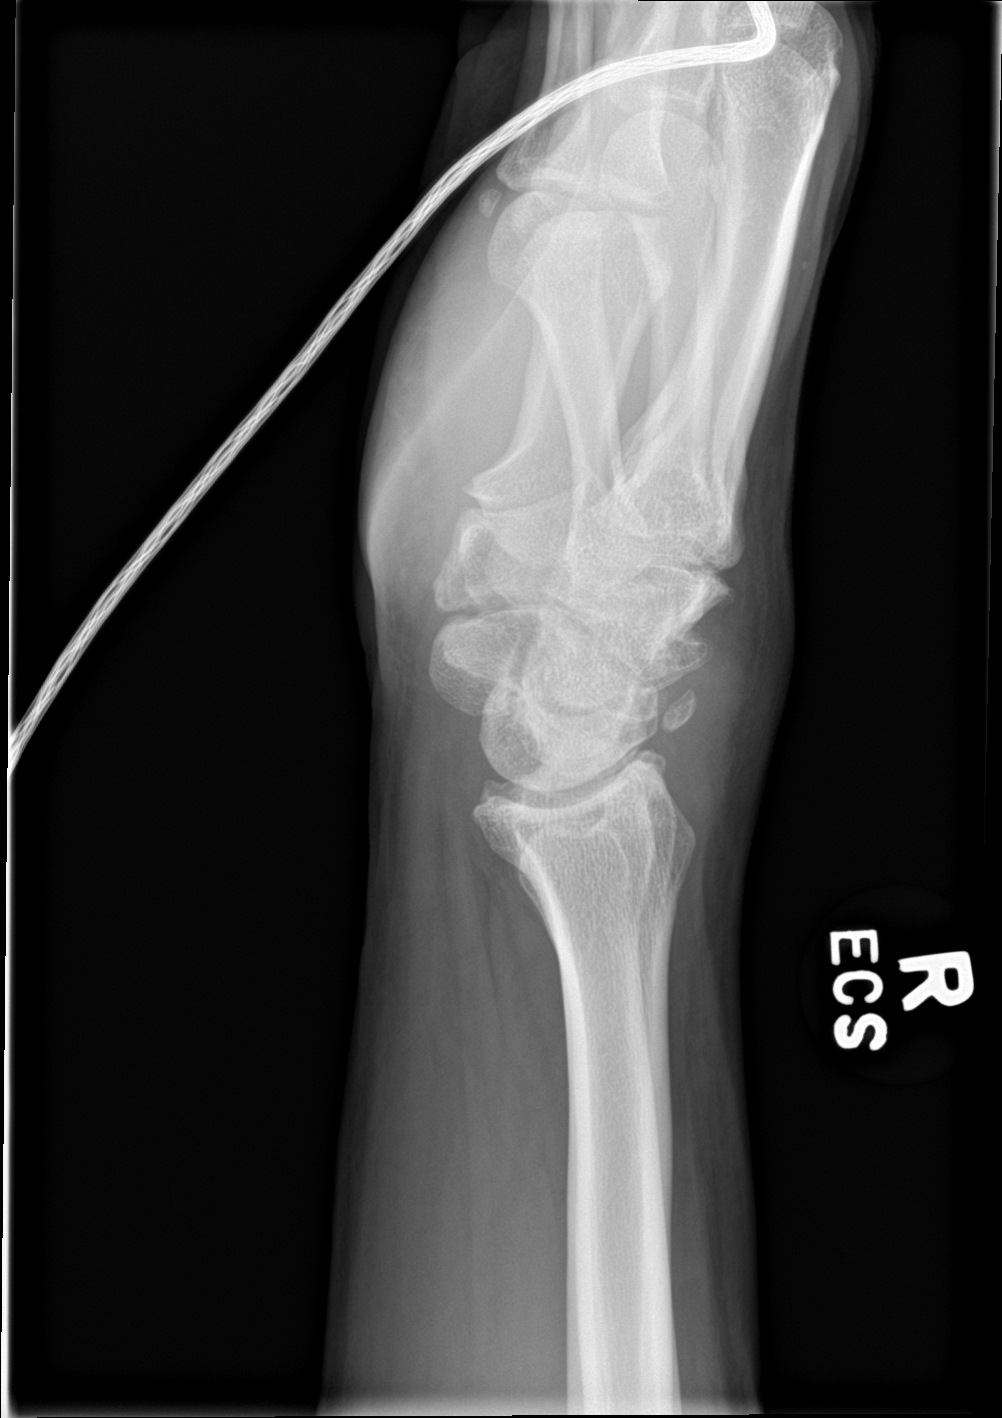

[2 of 2 positions shown; findings below may reference images not displayed]

FINDINGS: No acute displaced fracture or malalignment. Corticated bony opacity
dorsal aspect of wrist may relate to old fracture. Bony protuberance
off the distal scaphoid. Moderate degenerative changes at the
radiocarpal joint.
IMPRESSION: 1. No acute osseous abnormality.
2. Moderate arthritis at the radiocarpal joint.
3. Dorsal soft tissue swelling. Probable old fracture deformity at
the dorsal wrist. Bony protuberance at the mid to distal scaphoid,
potentially due to old trauma or atypical location of exostosis.

## 2020-08-28 IMAGING — CT CT HEAD CODE STROKE
4 series · 16 of 47 positions shown, 18 images · non-contrast
Comparison: [DATE]

CLINICAL DATA: Code stroke.

EXAM:
CT HEAD WITHOUT CONTRAST
TECHNIQUE: Contiguous axial images were obtained from the base of the skull
through the vertex without intravenous contrast.

[Series 3: head wo · axial · 0.47mm/px · z∈[-162,-32]mm · 7 of 36 slices shown, 9 images]
[im 5/36  brain]
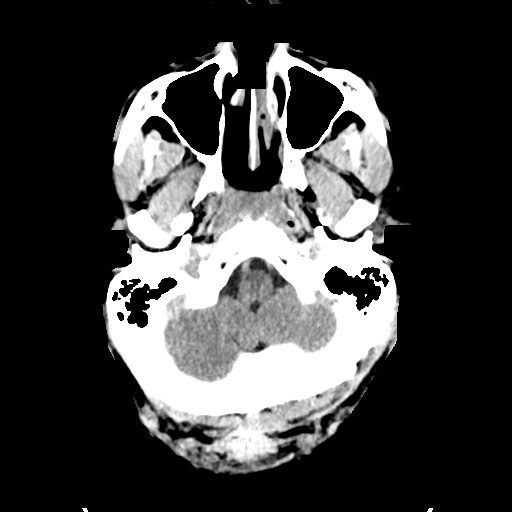
[im 5/36  bone]
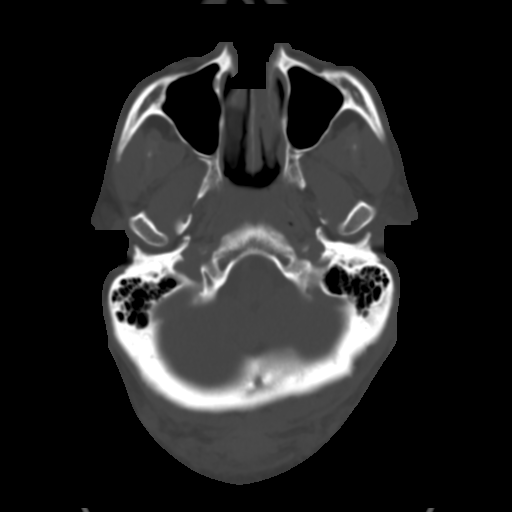
[im 9/36  brain]
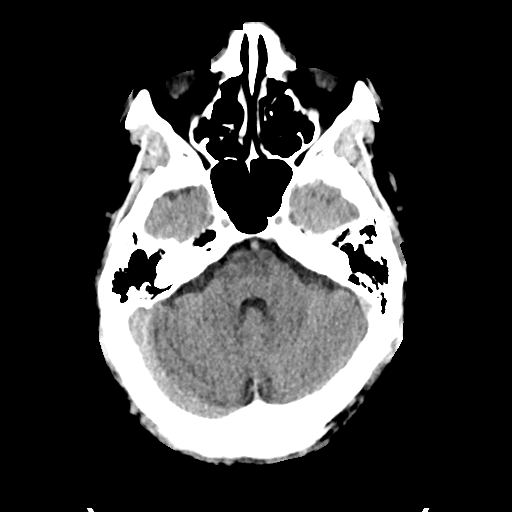
[im 14/36  brain]
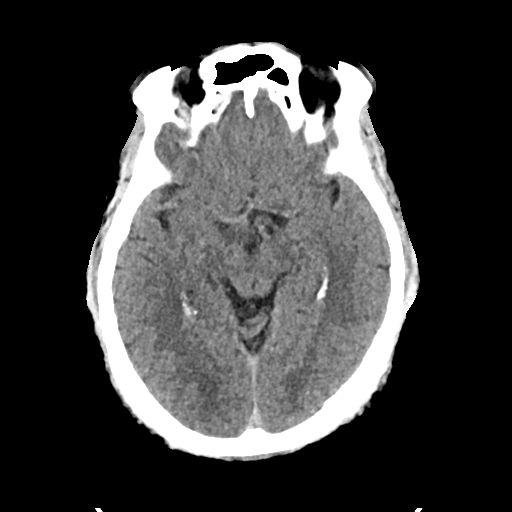
[im 18/36  brain]
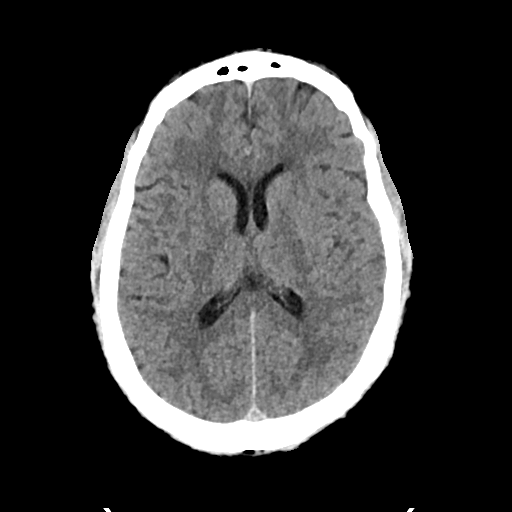
[im 22/36  brain]
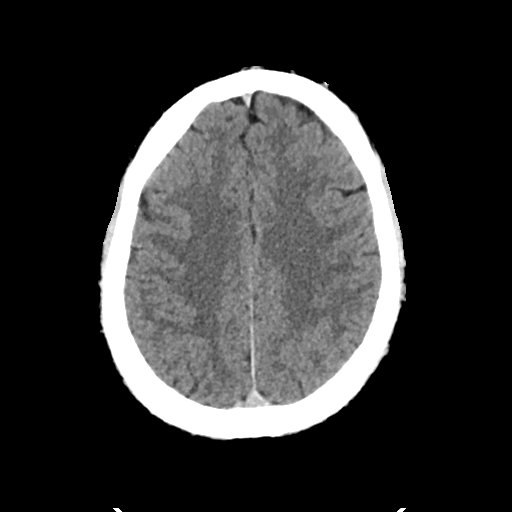
[im 22/36  bone]
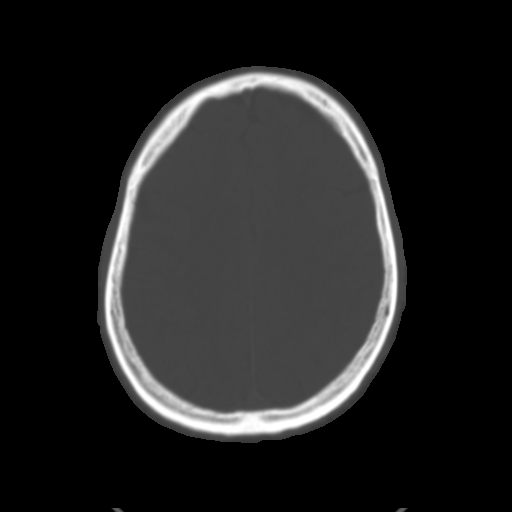
[im 27/36  brain]
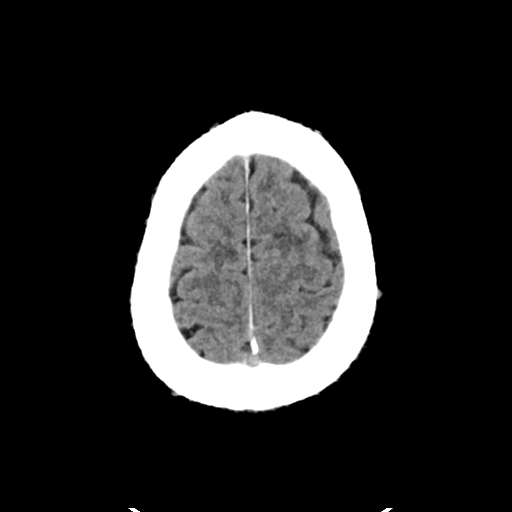
[im 31/36  brain]
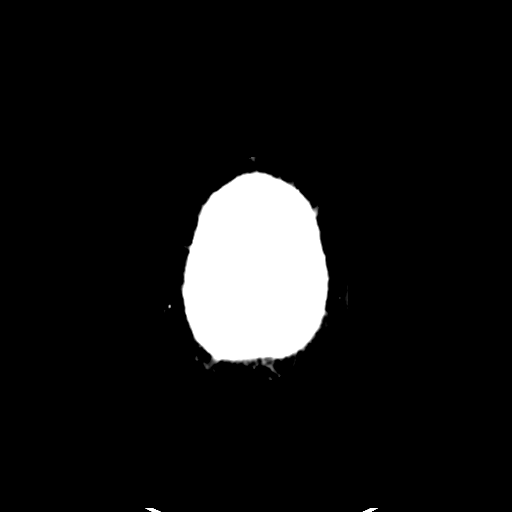

[Series 4: head bone · axial · 0.47mm/px · z∈[-166,-130]mm · 3 of 89 slices shown]
[im 9/89  bone]
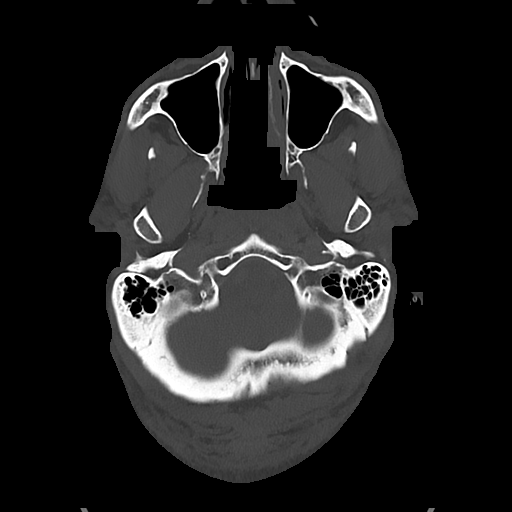
[im 18/89  bone]
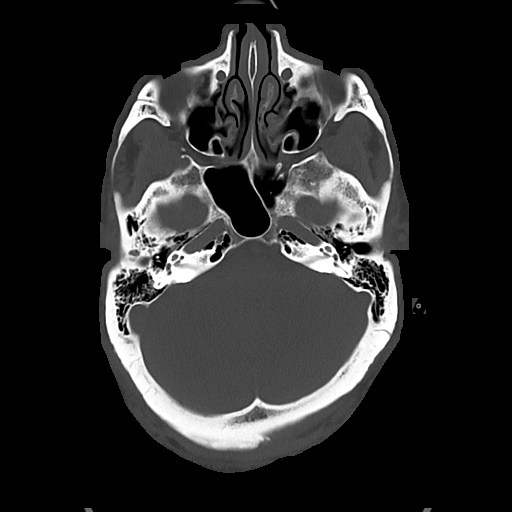
[im 27/89  bone]
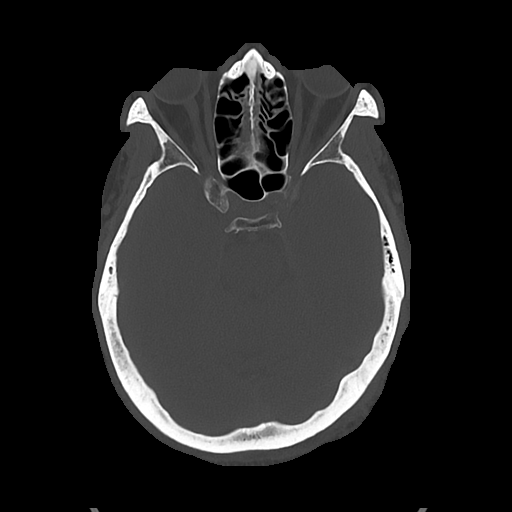

[Series 5: cor soft · coronal · 0.33mm/px · 3 of 81 slices shown]
[im 27/81  brain]
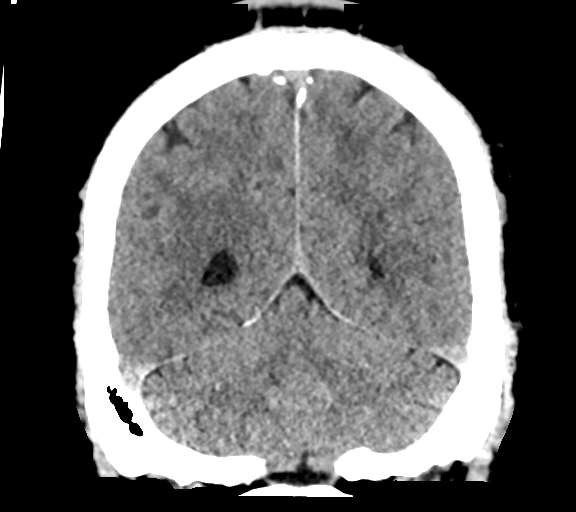
[im 36/81  brain]
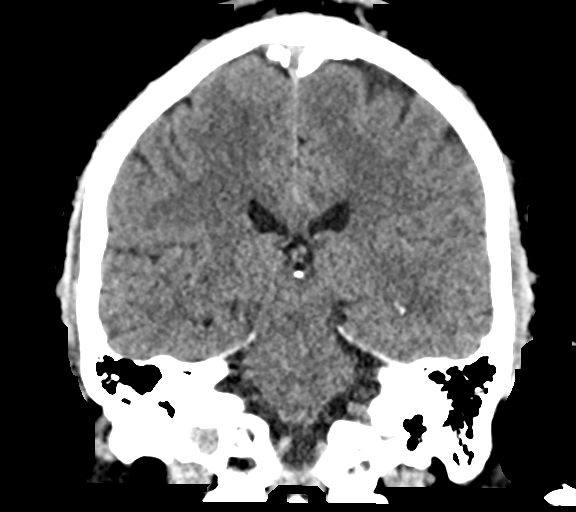
[im 45/81  brain]
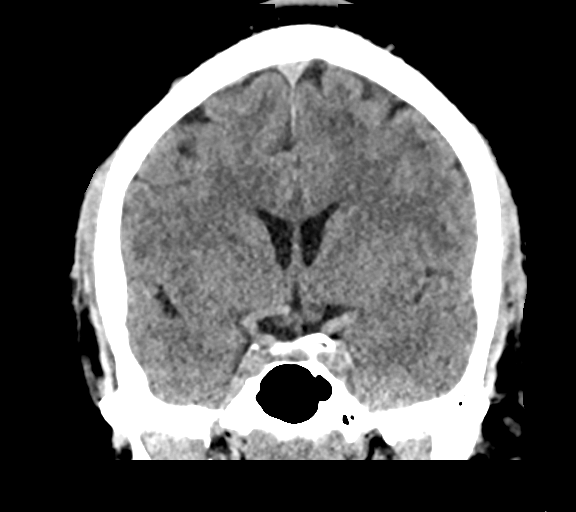

[Series 6: sag soft · sagittal · 0.33mm/px · 3 of 64 slices shown]
[im 22/64  brain]
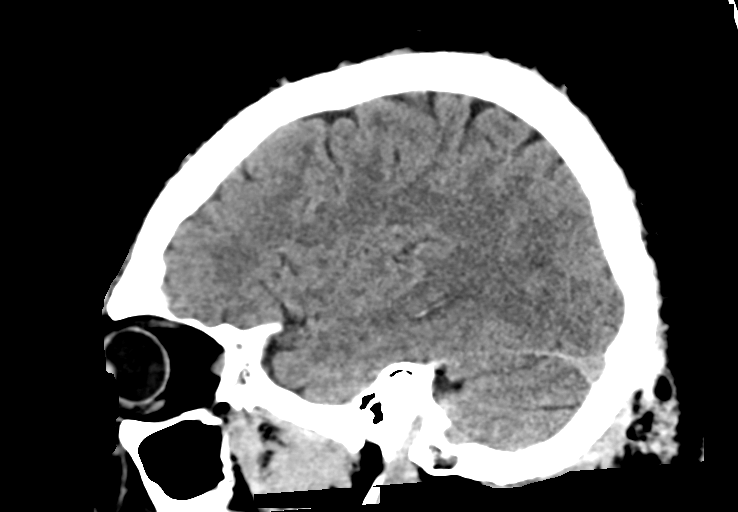
[im 32/64  brain]
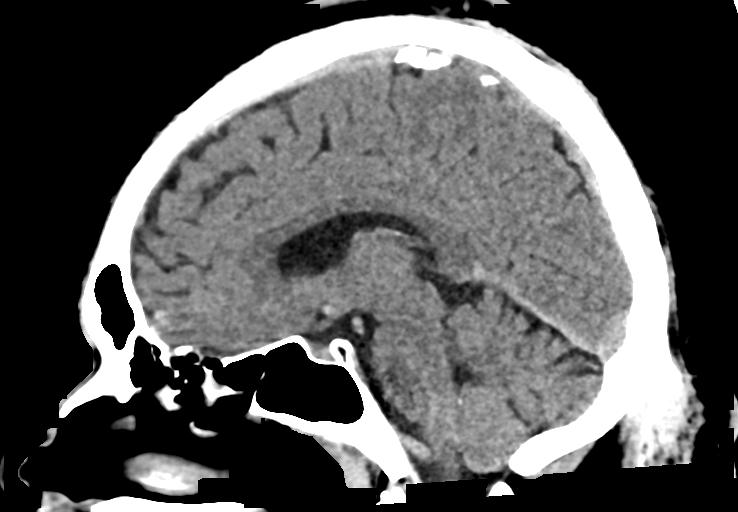
[im 43/64  brain]
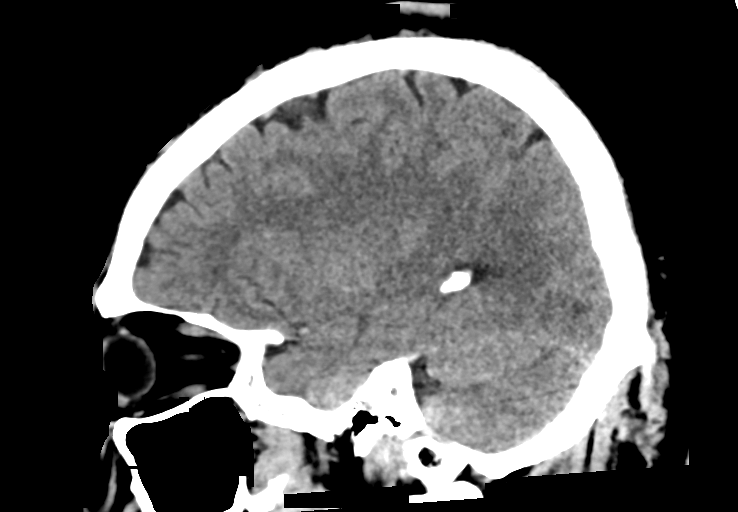

[16 of 47 positions shown; findings below may reference images not displayed]

FINDINGS: Brain: There is no acute intracranial hemorrhage, mass effect, or
edema. Gray-white differentiation is preserved. Ventricles and sulci
are normal in size and configuration. There is no extra-axial
collection.

Vascular: No hyperdense vessel.

Skull: Unremarkable.

Sinuses/Orbits: No acute abnormality.

Other: Mastoid air cells are clear.

ASPECTS (Alberta Stroke Program Early CT Score)

- Ganglionic level infarction (caudate, lentiform nuclei, internal
capsule, insula, M1-M3 cortex): 7

- Supraganglionic infarction (M4-M6 cortex): 3

Total score (0-10 with 10 being normal): 10
IMPRESSION: There is no acute intracranial hemorrhage or evidence of acute
infarction. ASPECT score is 10.

These results were communicated to Dr. PAULUS N at [DATE] on
[DATE] by text page via the [employer] messaging system.

## 2020-08-28 IMAGING — MR MR HEAD W/O CM
12 of 13 series · 44 of 48 positions shown · non-contrast
Comparison: None.

CLINICAL DATA: Right-sided weakness, code stroke follow-up

EXAM:
MRI HEAD WITHOUT CONTRAST
TECHNIQUE: Multiplanar, multiecho pulse sequences of the brain and surrounding
structures were obtained without intravenous contrast.

[Series 5: DWI · axial · 3.0mm · 0.88mm/px · z∈[-92,+78]mm · 8 of 116 slices shown (1 of 4)]
[im 1/116]
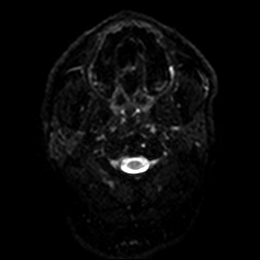
[im 17/116]
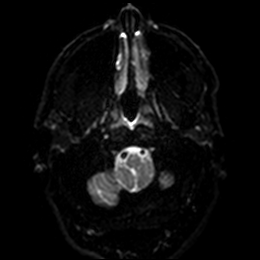
[im 33/116]
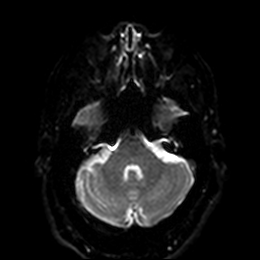
[im 50/116]
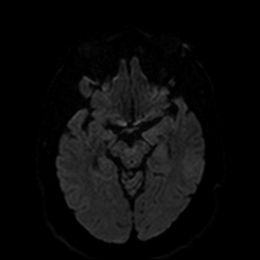
[im 66/116]
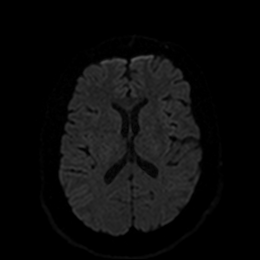
[im 83/116]
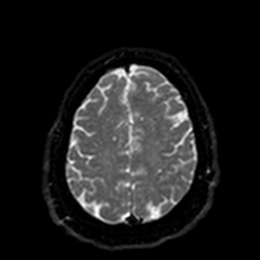
[im 99/116]
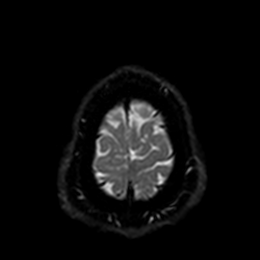
[im 116/116]
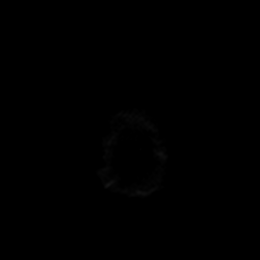

[Series 6: DWI · axial · 3.0mm · 0.88mm/px · z∈[-92,+78]mm · 4 of 58 slices shown (2 of 4)]
[im 1/58]
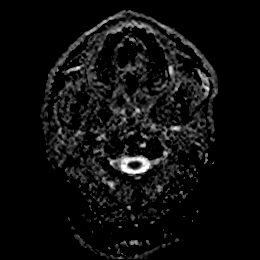
[im 20/58]
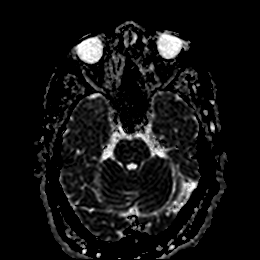
[im 39/58]
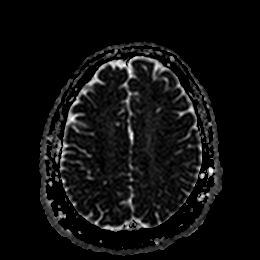
[im 58/58]
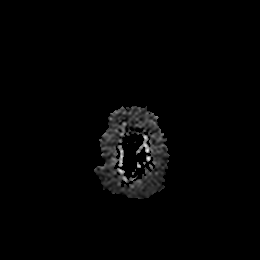

[Series 7: DWI · coronal · 4.0mm · 0.88mm/px · 6 of 76 slices shown (3 of 4)]
[im 1/76]
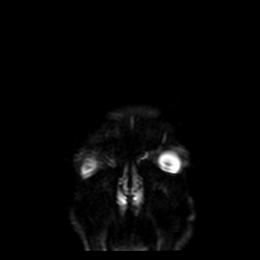
[im 16/76]
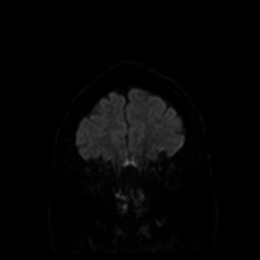
[im 31/76]
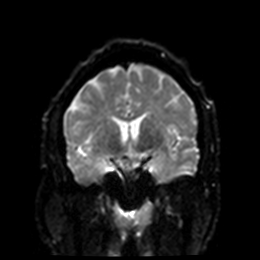
[im 46/76]
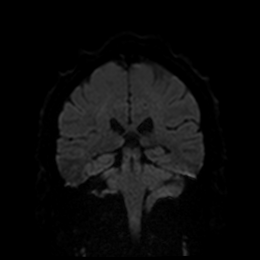
[im 61/76]
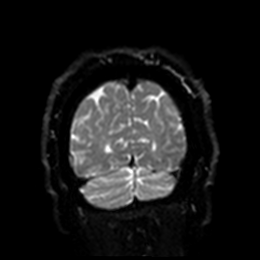
[im 76/76]
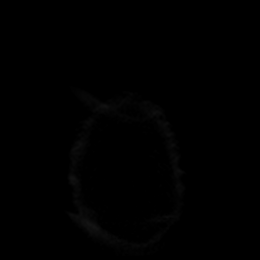

[Series 8: DWI · coronal · 4.0mm · 0.88mm/px · 3 of 38 slices shown (4 of 4)]
[im 1/38]
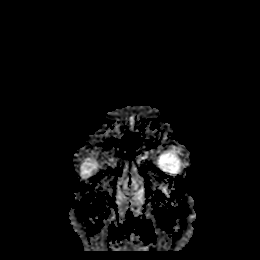
[im 19/38]
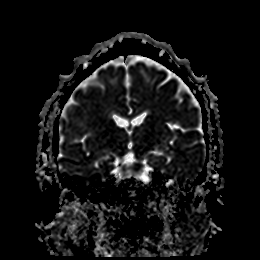
[im 38/38]
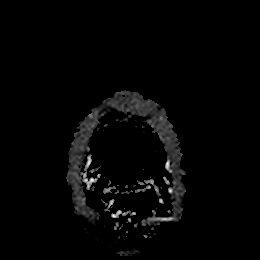

[Series 9: T1 · sagittal · 5.0mm · 0.81mm/px · 2 of 25 slices shown]
[im 1/25]
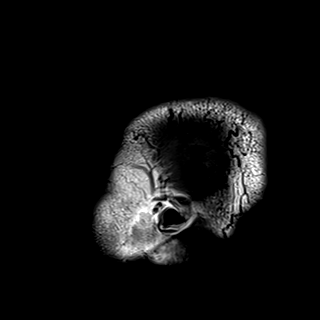
[im 25/25]
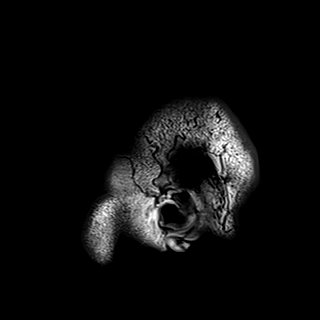

[Series 10: T2 · axial · 5.0mm · 0.75mm/px · z∈[-84,+77]mm · 2 of 28 slices shown (1 of 2)]
[im 1/28]
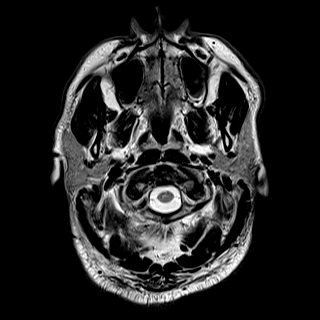
[im 28/28]
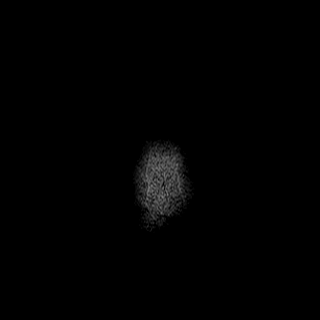

[Series 11: FLAIR · axial · 5.0mm · 0.47mm/px · z∈[-83,+78]mm · 2 of 28 slices shown]
[im 1/28]
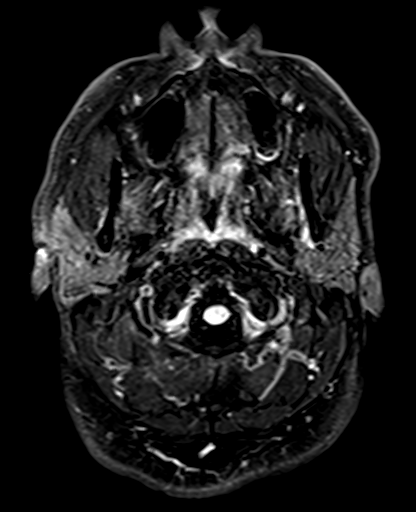
[im 28/28]
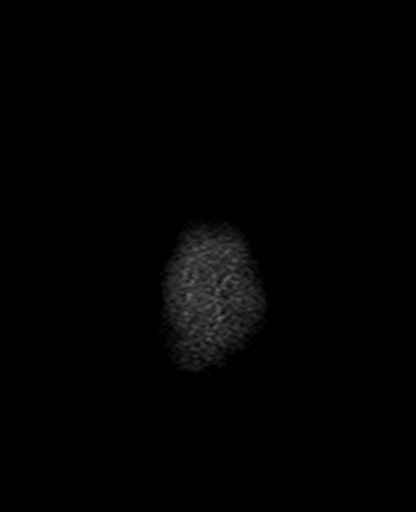

[Series 12: mag_images · axial · 3.0mm · 0.94mm/px · z∈[-80,+72]mm · 4 of 52 slices shown]
[im 1/52]
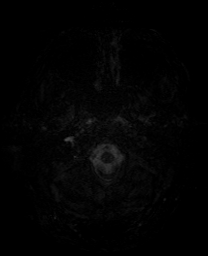
[im 18/52]
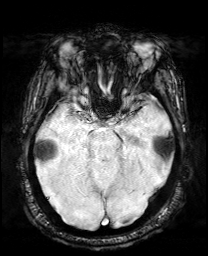
[im 35/52]
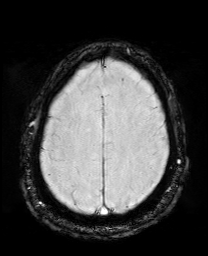
[im 52/52]
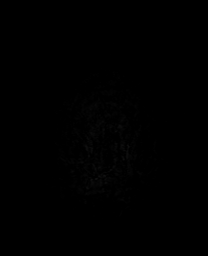

[Series 13: pha_images · axial · 3.0mm · 0.94mm/px · z∈[-80,+69]mm · 4 of 51 slices shown]
[im 1/51]
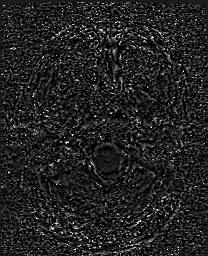
[im 17/51]
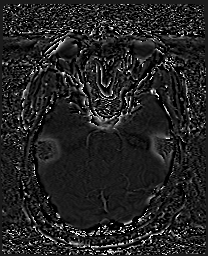
[im 34/51]
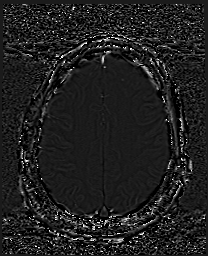
[im 51/51]
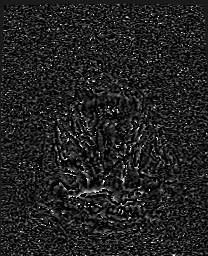

[Series 14: swi_images · axial · 3.0mm · 0.94mm/px · z∈[-80,+72]mm · 4 of 52 slices shown]
[im 1/52]
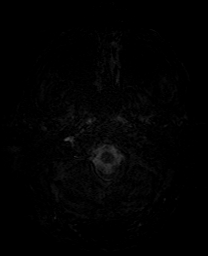
[im 18/52]
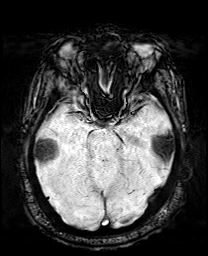
[im 35/52]
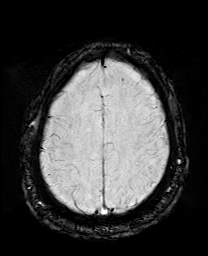
[im 52/52]
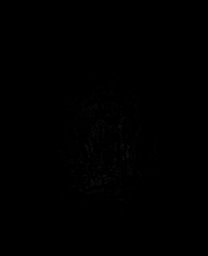

[Series 15: mip_images(sw) · axial · 24.0mm · 0.94mm/px · z∈[-70,+62]mm · 3 of 45 slices shown]
[im 1/45]
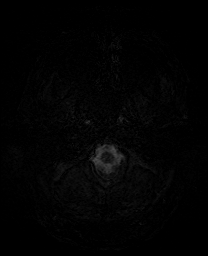
[im 23/45]
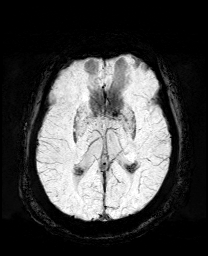
[im 45/45]
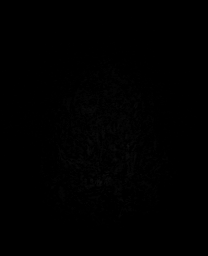

[Series 17: T2 · coronal · 5.0mm · 0.43mm/px · 2 of 31 slices shown (2 of 2)]
[im 1/31]
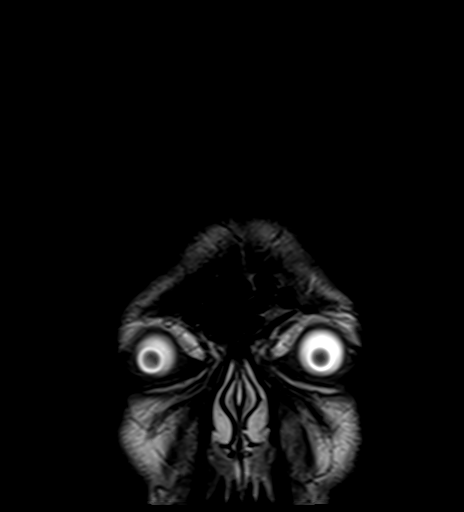
[im 31/31]
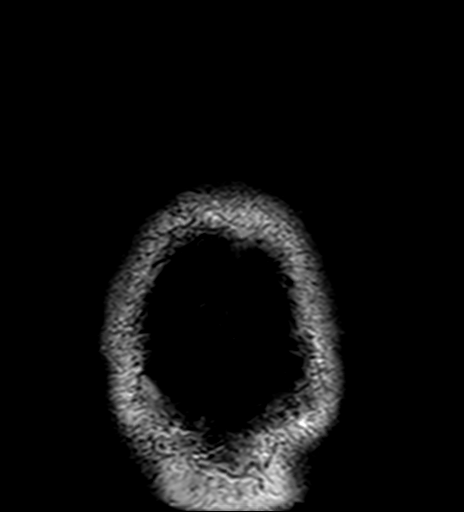

[44 of 48 positions shown; findings below may reference images not displayed]

FINDINGS: Brain: There is no acute infarction or intracranial hemorrhage.
There is no intracranial mass, mass effect, or edema. There is no
hydrocephalus or extra-axial fluid collection. Ventricles and sulci
are normal in size and configuration. A few small foci of T2
hyperintensity in the supratentorial white matter are nonspecific
but may reflect minor chronic microvascular ischemic changes.

Vascular: Major vessel flow voids at the skull base are preserved.

Skull and upper cervical spine: Normal marrow signal is preserved.

Sinuses/Orbits: Mild ethmoid mucosal thickening. Orbits are
unremarkable.

Other: Sella is unremarkable.  Mastoid air cells are clear.
IMPRESSION: No acute infarction, hemorrhage, or mass.

## 2020-08-28 IMAGING — CR DG KNEE 1-2V*R*
2 series · 2 of 2 positions shown · non-contrast
Comparison: Tibia fibula radiograph [DATE], knee [DATE]

CLINICAL DATA: Right knee pain

EXAM:
RIGHT KNEE - 1-2 VIEW

[knee ap]
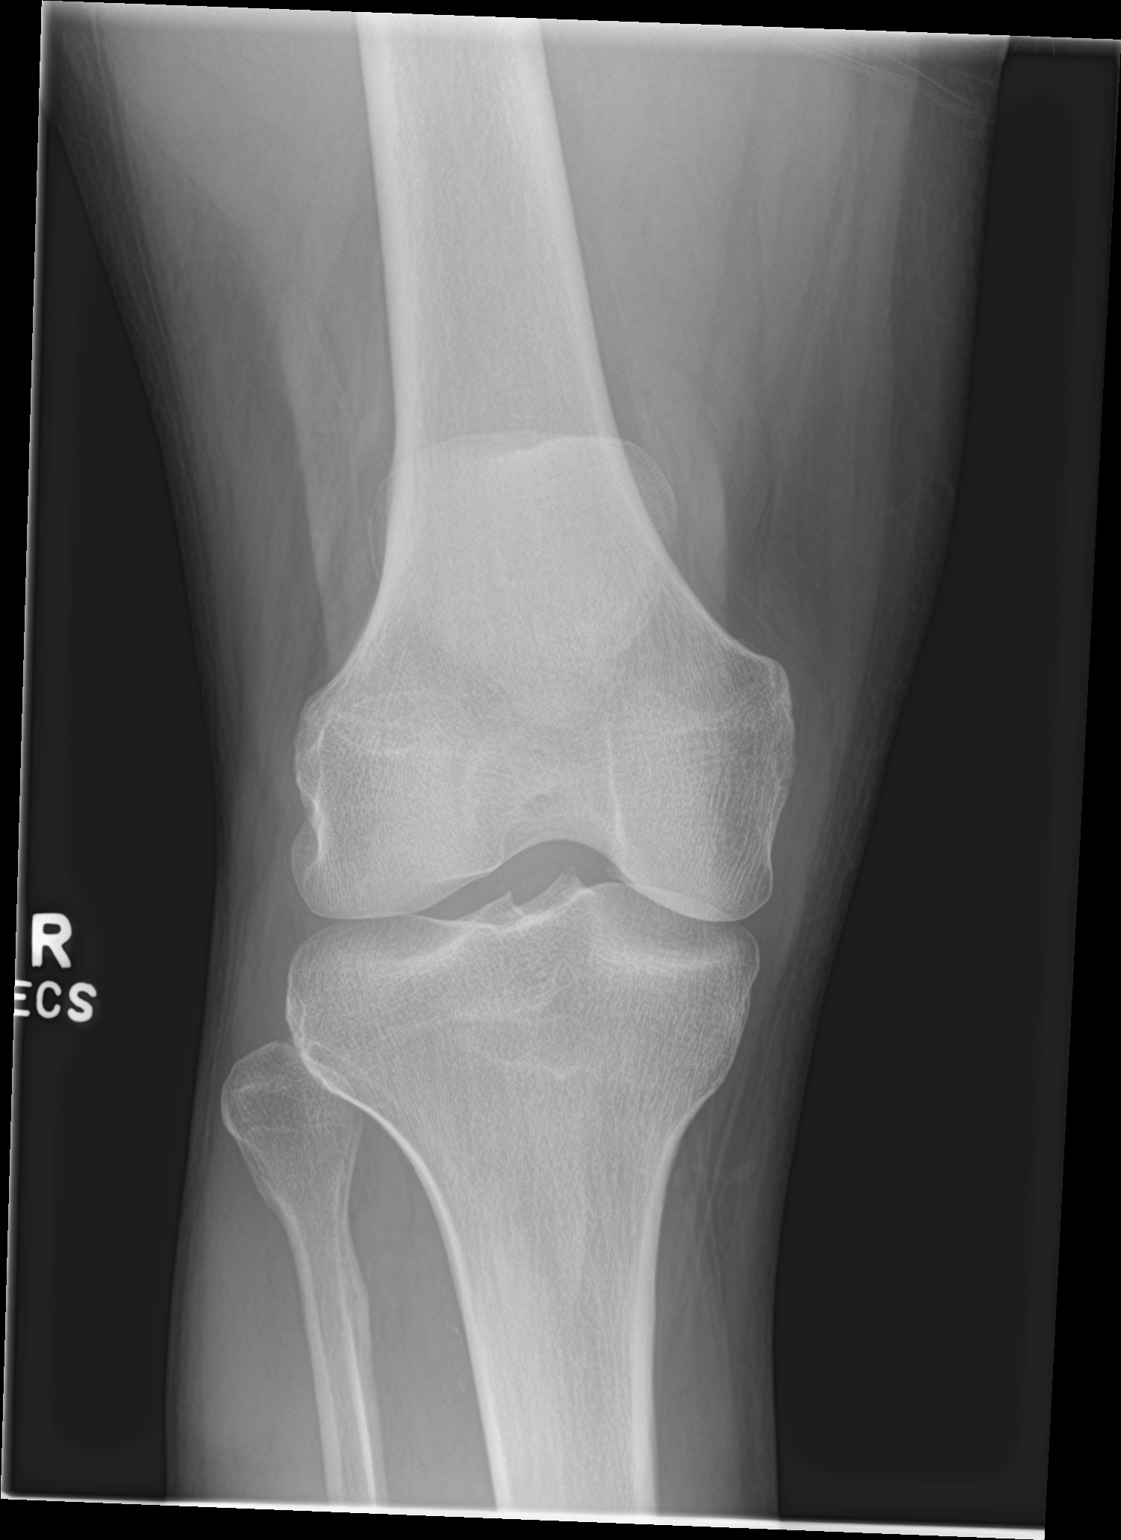

[knee lat]
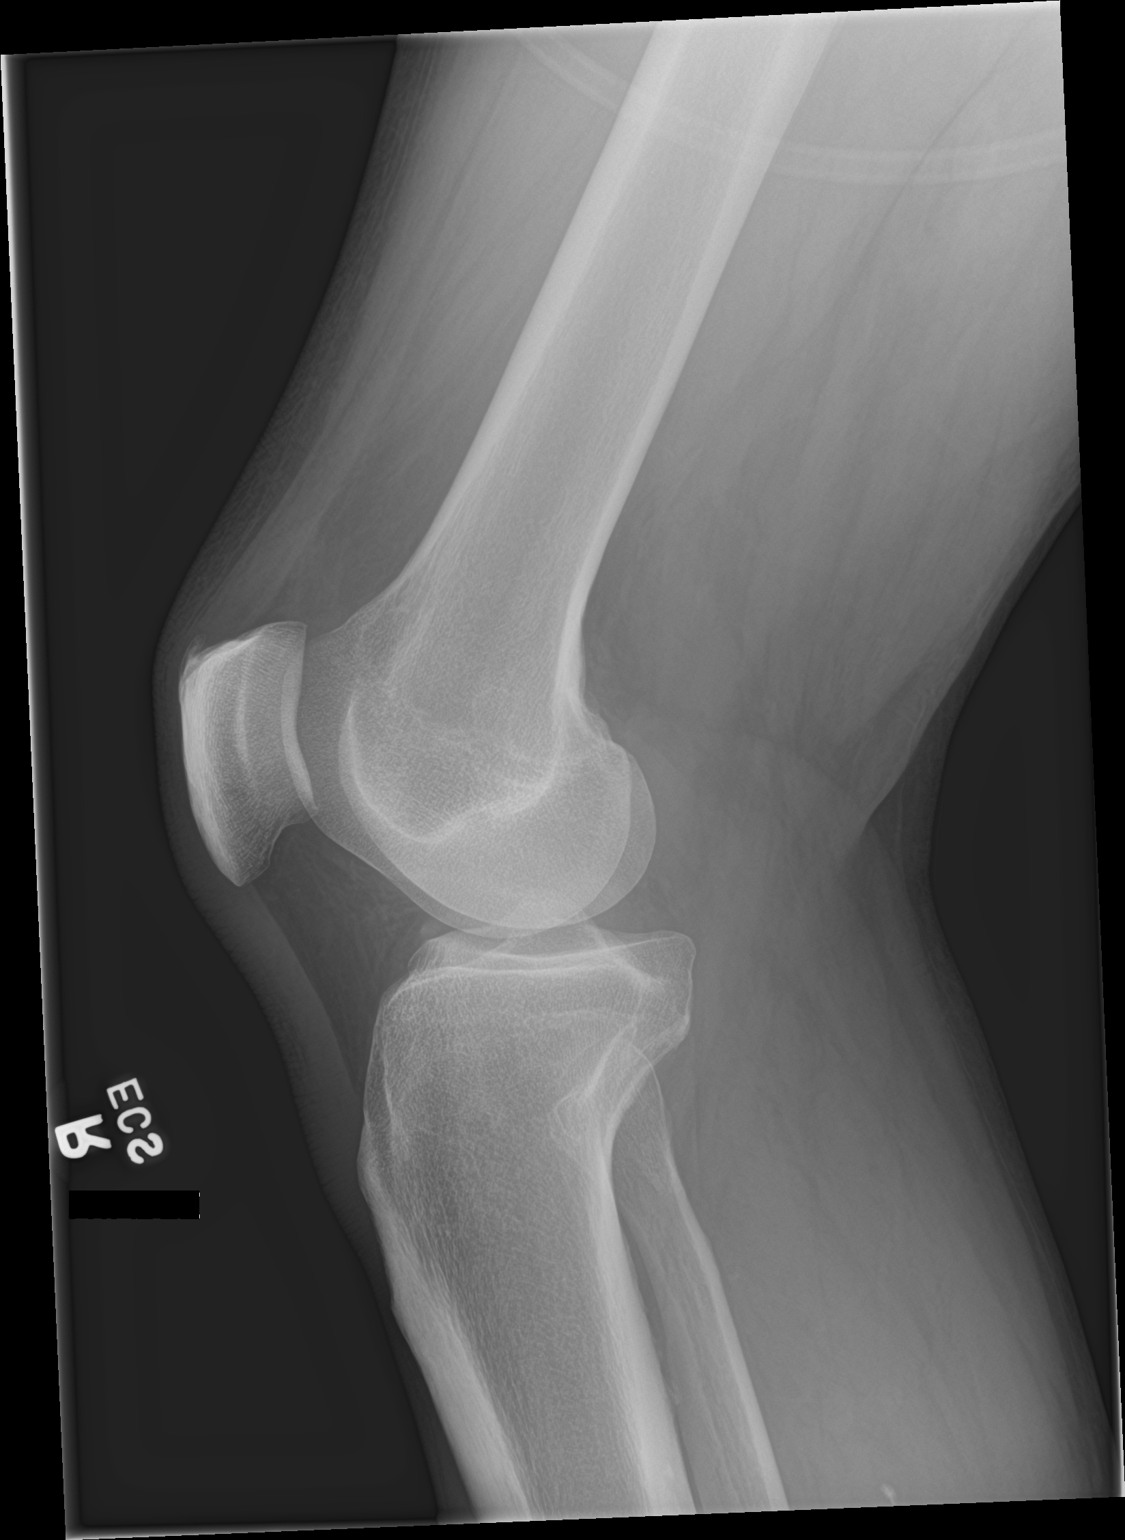

[2 of 2 positions shown; findings below may reference images not displayed]

FINDINGS: No evidence of fracture, dislocation, or joint effusion. No evidence
of arthropathy or other focal bone abnormality. Soft tissues are
unremarkable.
IMPRESSION: Negative.

## 2020-08-28 MED ORDER — CLOPIDOGREL BISULFATE 300 MG PO TABS
300.0000 mg | ORAL_TABLET | Freq: Once | ORAL | Status: AC
  Administered 2020-08-28: 300 mg via ORAL
  Filled 2020-08-28: qty 1

## 2020-08-28 MED ORDER — FOLIC ACID 1 MG PO TABS
1.0000 mg | ORAL_TABLET | Freq: Every day | ORAL | Status: DC
  Administered 2020-08-28 – 2020-08-29 (×2): 1 mg via ORAL
  Filled 2020-08-28 (×2): qty 1

## 2020-08-28 MED ORDER — ASPIRIN 300 MG RE SUPP: 300.0000 mg | Freq: Every day | RECTAL | Status: DC

## 2020-08-28 MED ORDER — THIAMINE HCL 100 MG/ML IJ SOLN: 100.0000 mg | Freq: Every day | INTRAMUSCULAR | Status: DC

## 2020-08-28 MED ORDER — ASPIRIN 325 MG PO TABS
325.0000 mg | ORAL_TABLET | Freq: Every day | ORAL | Status: DC
  Administered 2020-08-29: 325 mg via ORAL
  Filled 2020-08-28 (×2): qty 1

## 2020-08-28 MED ORDER — ACETAMINOPHEN 325 MG PO TABS
650.0000 mg | ORAL_TABLET | ORAL | Status: DC | PRN
  Administered 2020-08-28 – 2020-08-29 (×3): 650 mg via ORAL
  Filled 2020-08-28 (×3): qty 2

## 2020-08-28 MED ORDER — SODIUM CHLORIDE 0.9 % IV SOLN: INTRAVENOUS | Status: DC

## 2020-08-28 MED ORDER — ACETAMINOPHEN 160 MG/5ML PO SOLN: 650.0000 mg | ORAL | Status: DC | PRN

## 2020-08-28 MED ORDER — IOHEXOL 350 MG/ML SOLN
75.0000 mL | Freq: Once | INTRAVENOUS | Status: AC | PRN
  Administered 2020-08-28: 75 mL via INTRAVENOUS

## 2020-08-28 MED ORDER — LORAZEPAM 2 MG/ML IJ SOLN: 1.0000 mg | INTRAMUSCULAR | Status: DC | PRN

## 2020-08-28 MED ORDER — ENOXAPARIN SODIUM 40 MG/0.4ML ~~LOC~~ SOLN
40.0000 mg | SUBCUTANEOUS | Status: DC
  Administered 2020-08-28: 40 mg via SUBCUTANEOUS
  Filled 2020-08-28: qty 0.4

## 2020-08-28 MED ORDER — THIAMINE HCL 100 MG PO TABS
100.0000 mg | ORAL_TABLET | Freq: Every day | ORAL | Status: DC
  Administered 2020-08-29: 100 mg via ORAL
  Filled 2020-08-28: qty 1

## 2020-08-28 MED ORDER — ACETAMINOPHEN 650 MG RE SUPP: 650.0000 mg | RECTAL | Status: DC | PRN

## 2020-08-28 MED ORDER — ADULT MULTIVITAMIN W/MINERALS CH
1.0000 | ORAL_TABLET | Freq: Every day | ORAL | Status: DC
  Administered 2020-08-28 – 2020-08-29 (×2): 1 via ORAL
  Filled 2020-08-28 (×2): qty 1

## 2020-08-28 MED ORDER — LORAZEPAM 1 MG PO TABS
1.0000 mg | ORAL_TABLET | ORAL | Status: DC | PRN
  Administered 2020-08-29: 1 mg via ORAL
  Filled 2020-08-28: qty 1

## 2020-08-28 MED ORDER — FOLIC ACID 1 MG PO TABS: 1.0000 mg | ORAL_TABLET | Freq: Every day | ORAL | Status: DC

## 2020-08-28 MED ORDER — CLOPIDOGREL BISULFATE 75 MG PO TABS
75.0000 mg | ORAL_TABLET | Freq: Every day | ORAL | Status: DC
  Administered 2020-08-29: 75 mg via ORAL
  Filled 2020-08-28: qty 1

## 2020-08-28 MED ORDER — STROKE: EARLY STAGES OF RECOVERY BOOK
Freq: Once | Status: AC
  Filled 2020-08-28: qty 1

## 2020-08-28 NOTE — ED Notes (Signed)
PT TO MRI

## 2020-08-28 NOTE — ED Notes (Signed)
Admitting at bedside 

## 2020-08-28 NOTE — ED Notes (Signed)
Folic acid requested

## 2020-08-28 NOTE — Code Documentation (Signed)
Stroke Response Nurse Documentation Code Documentation  AYCE PIETRZYK is a 59 y.o. male arriving to [facility] ED via [facility] on 08/28/20 with past medical hx of hypertension and smoker. Has family history of stroke. Patient from home where he was LKW at 1300. He had finished work and fell when he got home. Noted right sided weakness and confusion. Code stroke was activated by EMS.  Stroke team at the bedside on patient arrival. Labs drawn and patient cleared for CT by Dr. Tomi Bamberger. Patient to CT with team. NIHSS 3, see documentation for details and code stroke times. Patient with right facial droop, right leg weakness and right decreased sensation on exam.   The following imaging was completed: CT, CTA head and neck, CTP. Patient is not a candidate for tPA due to out of window Care/Plan Q2h VS/neuro checks x 12h; then Q4. Bedside handoff with ED RN Carlis Abbott.    Leverne Humbles Stroke Response RN
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
## 2020-08-28 NOTE — ED Triage Notes (Signed)
Patient BIB [facility] for right sided weakness, confusion, and tingling in lower extremities that started at 1300 today. Patient alert and oriented on arrival to ED with 16g saline lock in right AC.
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
## 2020-08-29 ENCOUNTER — Observation Stay (HOSPITAL_BASED_OUTPATIENT_CLINIC_OR_DEPARTMENT_OTHER): Payer: 59

## 2020-08-29 ENCOUNTER — Other Ambulatory Visit: Payer: Self-pay

## 2020-08-29 DIAGNOSIS — I6389 Other cerebral infarction: Secondary | ICD-10-CM

## 2020-08-29 DIAGNOSIS — F17200 Nicotine dependence, unspecified, uncomplicated: Secondary | ICD-10-CM

## 2020-08-29 DIAGNOSIS — Z7289 Other problems related to lifestyle: Secondary | ICD-10-CM

## 2020-08-29 DIAGNOSIS — I160 Hypertensive urgency: Secondary | ICD-10-CM

## 2020-08-29 DIAGNOSIS — E785 Hyperlipidemia, unspecified: Secondary | ICD-10-CM

## 2020-08-29 DIAGNOSIS — I639 Cerebral infarction, unspecified: Secondary | ICD-10-CM | POA: Diagnosis not present

## 2020-08-29 LAB — COMPREHENSIVE METABOLIC PANEL
ALT: 41 U/L (ref 0–44)
AST: 50 U/L — ABNORMAL HIGH (ref 15–41)
Albumin: 4 g/dL (ref 3.5–5.0)
Alkaline Phosphatase: 68 U/L (ref 38–126)
Anion gap: 9 (ref 5–15)
BUN: 7 mg/dL (ref 6–20)
CO2: 25 mmol/L (ref 22–32)
Calcium: 9.4 mg/dL (ref 8.9–10.3)
Chloride: 104 mmol/L (ref 98–111)
Creatinine, Ser: 0.79 mg/dL (ref 0.61–1.24)
GFR, Estimated: >60 mL/min (ref >60)
Glucose, Bld: 91 mg/dL (ref 70–99)
Potassium: 3.5 mmol/L (ref 3.5–5.1)
Sodium: 138 mmol/L (ref 135–145)
Total Bilirubin: 0.7 mg/dL (ref 0.3–1.2)
Total Protein: 6.8 g/dL (ref 6.5–8.1)

## 2020-08-29 LAB — ECHOCARDIOGRAM COMPLETE
Area-P 1/2: 2.66 cm2
Height: 72 in
S' Lateral: 2.82 cm
Weight: 3520 oz

## 2020-08-29 LAB — CBC
HCT: 43.6 % (ref 39.0–52.0)
Hemoglobin: 14.7 g/dL (ref 13.0–17.0)
MCH: 31.4 pg (ref 26.0–34.0)
MCHC: 33.7 g/dL (ref 30.0–36.0)
MCV: 93.2 fL (ref 80.0–100.0)
Platelets: 377 10*3/uL (ref 150–400)
RBC: 4.68 MIL/uL (ref 4.22–5.81)
RDW: 12.8 % (ref 11.5–15.5)
WBC: 6 10*3/uL (ref 4.0–10.5)
nRBC: 0 % (ref 0.0–0.2)

## 2020-08-29 LAB — LIPID PANEL
Cholesterol: 221 mg/dL — ABNORMAL HIGH (ref 0–200)
HDL: 79 mg/dL (ref >40)
LDL Cholesterol: 116 mg/dL — ABNORMAL HIGH (ref 0–99)
Total CHOL/HDL Ratio: 2.8 RATIO
Triglycerides: 129 mg/dL (ref <150)
VLDL: 26 mg/dL (ref 0–40)

## 2020-08-29 LAB — TROPONIN I (HIGH SENSITIVITY)
Troponin I (High Sensitivity): 6 ng/L (ref <18)
Troponin I (High Sensitivity): 6 ng/L (ref <18)

## 2020-08-29 LAB — HIV ANTIBODY (ROUTINE TESTING W REFLEX): HIV Screen 4th Generation wRfx: NONREACTIVE

## 2020-08-29 LAB — CK: Total CK: 383 U/L (ref 49–397)

## 2020-08-29 MED ORDER — ASPIRIN EC 81 MG PO TBEC: 81.0000 mg | DELAYED_RELEASE_TABLET | Freq: Every day | ORAL | Status: DC

## 2020-08-29 MED ORDER — ATORVASTATIN CALCIUM 40 MG PO TABS
40.0000 mg | ORAL_TABLET | Freq: Every day | ORAL | 1 refills | Status: DC
Start: 2020-08-30 — End: 2022-08-17

## 2020-08-29 MED ORDER — ATORVASTATIN CALCIUM 40 MG PO TABS
40.0000 mg | ORAL_TABLET | Freq: Every day | ORAL | Status: DC
  Administered 2020-08-29: 40 mg via ORAL
  Filled 2020-08-29: qty 1

## 2020-08-29 MED ORDER — AMLODIPINE BESYLATE 10 MG PO TABS
10.0000 mg | ORAL_TABLET | Freq: Every day | ORAL | Status: DC
  Administered 2020-08-29: 10 mg via ORAL
  Filled 2020-08-29: qty 1

## 2020-08-29 MED ORDER — AMLODIPINE BESYLATE 10 MG PO TABS: 10.0000 mg | ORAL_TABLET | Freq: Every day | ORAL | 1 refills | Status: AC

## 2020-08-29 MED ORDER — HYDRALAZINE HCL 20 MG/ML IJ SOLN
10.0000 mg | INTRAMUSCULAR | Status: DC | PRN
  Administered 2020-08-29: 10 mg via INTRAVENOUS
  Filled 2020-08-29: qty 1

## 2020-08-29 MED ORDER — CLOPIDOGREL BISULFATE 75 MG PO TABS: 75.0000 mg | ORAL_TABLET | Freq: Every day | ORAL | 0 refills | Status: DC

## 2020-08-29 MED ORDER — HYDRALAZINE HCL 20 MG/ML IJ SOLN
10.0000 mg | INTRAMUSCULAR | Status: DC | PRN
  Administered 2020-08-29: 10 mg via INTRAVENOUS
  Filled 2020-08-29 (×2): qty 1

## 2020-08-29 MED ORDER — ASPIRIN 81 MG PO TBEC: 81.0000 mg | DELAYED_RELEASE_TABLET | Freq: Every day | ORAL | 1 refills | Status: AC

## 2020-08-29 NOTE — Progress Notes (Signed)
STROKE TEAM PROGRESS NOTE   INTERVAL HISTORY I personally reviewed history of presenting illness with the patient, electronic medical records and imaging films in PACS.  Presented with right-sided weakness and numbness which is mostly improved.  MRI is negative for acute stroke.  CT angiogram shows mild intracranial atherosclerosis of the left M1 but no high-grade stenosis or occlusion.  LDL cholesterol is 1 1 6  mg percent.  Echocardiogram is pending.  Vitals:   08/29/20 0620 08/29/20 0800 08/29/20 0853 08/29/20 0928  BP: (!) 176/99 (!) 180/110 (!) 170/114 (!) 164/99  Pulse:  86  96  Resp: 19 18    Temp: 98.4 F (36.9 C) 98 F (36.7 C)    TempSrc: Oral Oral    SpO2: 100% 97%    Weight:      Height:       CBC:  Recent Labs  Lab 08/28/20 1753 08/28/20 1753 08/28/20 2029 08/29/20 0436  WBC 7.3   < > 7.3 6.0  NEUTROABS 3.5  --   --   --   HGB 14.8   < > 15.1 14.7  HCT 44.2   < > 45.2 43.6  MCV 94.4   < > 95.4 93.2  PLT 360   < > 368 377   < > = values in this interval not displayed.   Basic Metabolic Panel:  Recent Labs  Lab 08/28/20 1753 08/28/20 1753 08/28/20 2029 08/29/20 0436  NA 140  --   --  138  K 4.1  --   --  3.5  CL 102  --   --  104  CO2 27  --   --  25  GLUCOSE 102*  --   --  91  BUN 6  --   --  7  CREATININE 0.99   < > 0.93 0.79  CALCIUM 9.7  --   --  9.4   < > = values in this interval not displayed.   Lipid Panel:  Recent Labs  Lab 08/29/20 0436  CHOL 221*  TRIG 129  HDL 79  CHOLHDL 2.8  VLDL 26  LDLCALC 116*   HgbA1c: No results for input(s): HGBA1C in the last 168 hours. Urine Drug Screen:  Recent Labs  Lab 08/28/20 1851  LABOPIA NONE DETECTED  COCAINSCRNUR NONE DETECTED  LABBENZ NONE DETECTED  AMPHETMU NONE DETECTED  THCU NONE DETECTED  LABBARB NONE DETECTED    Alcohol Level  Recent Labs  Lab 08/28/20 1753  ETH 158*    IMAGING past 24 hours CT Code Stroke CTA Head W/WO contrast  Result Date: 08/28/2020 CLINICAL DATA:   Code stroke follow-up EXAM: CT ANGIOGRAPHY HEAD AND NECK TECHNIQUE: Multidetector CT imaging of the head and neck was performed using the standard protocol during bolus administration of intravenous contrast. Multiplanar CT image reconstructions and MIPs were obtained to evaluate the vascular anatomy. Carotid stenosis measurements (when applicable) are obtained utilizing NASCET criteria, using the distal internal carotid diameter as the denominator. CONTRAST:  75 mL Omnipaque 350 COMPARISON:  None. FINDINGS: CTA NECK Aortic arch: Great vessel origins are patent. Right carotid system: Patent. Primarily calcified plaque at the common carotid bifurcation and proximal internal carotid causing minimal stenosis. Left carotid system: Patent. Mild atherosclerotic irregularity of the common carotid. Mild calcified plaque at the common carotid bifurcation. No measurable stenosis at the ICA origin. Vertebral arteries: Patent. Right vertebral artery is minimally dominant. No measurable stenosis or evidence of dissection. Skeleton: Cervical spine degenerative changes primarily at C5-C6 and  C6-C7. Other neck: No mass or adenopathy. Upper chest: No apical lung mass. Review of the MIP images confirms the above findings CTA HEAD Anterior circulation: Intracranial internal carotid arteries are patent. Anterior and middle cerebral arteries are patent. Mild atherosclerosis of the left M1 MCA. Posterior circulation: Intracranial vertebral arteries, basilar artery, and posterior cerebral arteries are patent. There are bilateral posterior communicating arteries present. Venous sinuses: Patent as allowed by contrast bolus timing. Review of the MIP images confirms the above findings IMPRESSION: No large vessel occlusion or hemodynamically significant stenosis. Mild left M1 MCA atherosclerosis. Electronically Signed   By: Macy Mis M.D.   On: 08/28/2020 18:07   DG Wrist 2 Views Right  Result Date: 08/28/2020 CLINICAL DATA:  Wrist  pain EXAM: RIGHT WRIST - 2 VIEW COMPARISON:  None. FINDINGS: No acute displaced fracture or malalignment. Corticated bony opacity dorsal aspect of wrist may relate to old fracture. Bony protuberance off the distal scaphoid. Moderate degenerative changes at the radiocarpal joint. IMPRESSION: 1. No acute osseous abnormality. 2. 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 3. Dorsal soft tissue swelling. Probable old fracture deformity at the dorsal wrist. Bony protuberance at the mid to distal scaphoid, potentially due to old trauma or atypical location of exostosis. Electronically Signed   By: Donavan Foil M.D.   On: 08/28/2020 22:51   DG Knee 1-2 Views Right  Result Date: 08/28/2020 CLINICAL DATA:  Right knee pain EXAM: RIGHT KNEE - 1-2 VIEW COMPARISON:  Tibia fibula radiograph 12/10/2012, knee 04/01/2010 FINDINGS: No evidence of fracture, dislocation, or joint effusion. No evidence of arthropathy or other focal bone abnormality. Soft tissues are unremarkable. IMPRESSION: Negative. Electronically Signed   By: Donavan Foil M.D.   On: 08/28/2020 22:47   CT Code Stroke CTA Neck W/WO contrast  Result Date: 08/28/2020 CLINICAL DATA:  Code stroke follow-up EXAM: CT ANGIOGRAPHY HEAD AND NECK TECHNIQUE: Multidetector CT imaging of the head and neck was performed using the standard protocol during bolus administration of intravenous contrast. Multiplanar CT image reconstructions and MIPs were obtained to evaluate the vascular anatomy. Carotid stenosis measurements (when applicable) are obtained utilizing NASCET criteria, using the distal internal carotid diameter as the denominator. CONTRAST:  75 mL Omnipaque 350 COMPARISON:  None. FINDINGS: CTA NECK Aortic arch: Great vessel origins are patent. Right carotid system: Patent. Primarily calcified plaque at the common carotid bifurcation and proximal internal carotid causing minimal stenosis. Left carotid system: Patent. Mild atherosclerotic irregularity of the  common carotid. Mild calcified plaque at the common carotid bifurcation. No measurable stenosis at the ICA origin. Vertebral arteries: Patent. Right vertebral artery is minimally dominant. No measurable stenosis or evidence of dissection. Skeleton: Cervical spine degenerative changes primarily at C5-C6 and C6-C7. Other neck: No mass or adenopathy. Upper chest: No apical lung mass. Review of the MIP images confirms the above findings CTA HEAD Anterior circulation: Intracranial internal carotid arteries are patent. Anterior and middle cerebral arteries are patent. Mild atherosclerosis of the left M1 MCA. Posterior circulation: Intracranial vertebral arteries, basilar artery, and posterior cerebral arteries are patent. There are bilateral posterior communicating arteries present. Venous sinuses: Patent as allowed by contrast bolus timing. Review of the MIP images confirms the above findings IMPRESSION: No large vessel occlusion or hemodynamically significant stenosis. Mild left M1 MCA atherosclerosis. Electronically Signed   By: Macy Mis M.D.   On: 08/28/2020 18:07   MR BRAIN WO CONTRAST  Result Date: 08/28/2020 CLINICAL DATA:  Right-sided weakness, code stroke follow-up EXAM: MRI HEAD WITHOUT CONTRAST  TECHNIQUE: Multiplanar, multiecho pulse sequences of the brain and surrounding structures were obtained without intravenous contrast. COMPARISON:  None. FINDINGS: Brain: There is no acute infarction or intracranial hemorrhage. There is no intracranial mass, mass effect, or edema. There is no hydrocephalus or extra-axial fluid collection. Ventricles and sulci are normal in size and configuration. A few small foci of T2 hyperintensity in the supratentorial white matter are nonspecific but may reflect minor chronic microvascular ischemic changes. Vascular: Major vessel flow voids at the skull base are preserved. Skull and upper cervical spine: Normal marrow signal is preserved. Sinuses/Orbits: Mild ethmoid  mucosal thickening. Orbits are unremarkable. Other: Sella is unremarkable.  Mastoid air cells are clear. IMPRESSION: No acute infarction, hemorrhage, or mass. Electronically Signed   By: Macy Mis M.D.   On: 08/28/2020 20:07   CT HEAD CODE STROKE WO CONTRAST  Result Date: 08/28/2020 CLINICAL DATA:  Code stroke. EXAM: CT HEAD WITHOUT CONTRAST TECHNIQUE: Contiguous axial images were obtained from the base of the skull through the vertex without intravenous contrast. COMPARISON:  05/23/2019 FINDINGS: Brain: There is no acute intracranial hemorrhage, mass effect, or edema. Gray-white differentiation is preserved. Ventricles and sulci are normal in size and configuration. There is no extra-axial collection. Vascular: No hyperdense vessel. Skull: Unremarkable. Sinuses/Orbits: No acute abnormality. Other: Mastoid air cells are clear. ASPECTS ([place] Stroke Program Early CT Score) - Ganglionic level infarction (caudate, lentiform nuclei, internal capsule, insula, M1-M3 cortex): 7 - Supraganglionic infarction (M4-M6 cortex): 3 Total score (0-10 with 10 being normal): 10 IMPRESSION: There is no acute intracranial hemorrhage or evidence of acute infarction. ASPECT score is 10. These results were communicated to Dr. Leonel Ramsay at 5:58 pm on 08/28/2020 by text page via the [facility] messaging system. Electronically Signed   By: Macy Mis M.D.   On: 08/28/2020 17:59    PHYSICAL EXAM Pleasant middle-age African-American male not in distress. . Afebrile. Head is nontraumatic. Neck is supple without bruit.    Cardiac exam no murmur or gallop. Lungs are clear to auscultation. Distal pulses are well felt. Neurological Exam ;  Awake  Alert oriented x 3. Normal speech and language.eye movements full without nystagmus.fundi were not visualized. Vision acuity and fields appear normal. Hearing is normal. Palatal movements are normal. Face symmetric. Tongue midline. Normal strength, tone, reflexes and coordination.  Normal sensation. Gait deferred.  ASSESSMENT/PLAN Larry Nichols is a 59 y.o. male with history of alcohol abuse, HTN presenting with R sided weakness following a fall where he passed out.   L brain TIA likely from small vessel disease  Code Stroke CT head No acute abnormality. ASPECTS 10.     CTA head & neck no LVO or significant stenosis. Mild L M1 atherosclerosis.  MRI  No acute abnormality  2D Echo pending   LDL 116  HgbA1c pending   VTE prophylaxis - Lovenox 40 mg sq daily   No antithrombotic prior to admission, now on aspirin 325 mg daily and clopidogrel 75 mg daily following load. Decrease aspirin to 81 and Continue DAPT x 3 weeks then aspirin alone     Therapy recommendations:  HH PT, HH OT  Disposition:  Return home  Hypertensive Urgency  BP as high as 179/112   Home meds:  Not on meds at home  Still on the higher side . Ok for BP goal normotensive (no need for permissive HTN)  Hyperlipidemia  Home meds:  No statin  LDL 116, goal < 70  Add lipitor 40  Continue statin  at discharge  Other Stroke Risk Factors  Cigarette smoker, advised to stop smoking  ETOH abuse, alcohol level 158, advised to drink no more than 2 drink(s) a day  Obesity, Body mass index is 29.84 kg/m., recommend weight loss, diet and exercise as appropriate   Family hx stroke (mother)  Other Active Problems  Syncope   Fall w/ R wrist and knee pain  Hospital day # 0  He presented with transient right-sided weakness and numbness likely due to left hemispheric TIA from small vessel disease.  Recommend aspirin 81 mg and Plavix 75 mg daily for 3 weeks followed by aspirin alone.  Aggressive risk factor modification.  Patient counseled to quit smoking cigarettes and drinking heavy alcohol.  Greater than 50% time during the 25-minute visit was spent in counseling and coordination of care about his TIA and stroke risk and answering questions.  Discussed with Dr. Cyndia Skeeters. Antony Contras,  MD To contact Stroke Continuity provider, please refer to [email or web address] After hours, contact General Neurology

## 2020-08-29 NOTE — Progress Notes (Signed)
  Echocardiogram 2D Echocardiogram has been performed.  Larry Nichols 08/29/2020, 9:33 AM

## 2020-08-29 NOTE — TOC Initial Note (Addendum)
Transition of Care [facility]) - Initial/Assessment Note    Patient Details  Name: Larry Nichols MRN: [id number] Date of Birth: 1960/11/17  Transition of Care [facility]) CM/SW Contact:    Marilu Favre, RN Phone Number: 08/29/2020, 1:25 PM  Clinical Narrative:                 Patient from home with girl friend.   Address is [place] , [place]  Patient does not have a PCP.   Patient interested in establishing care with Dr Lucia Gaskins in [place], [place] called received generic voicemail.   Patient interested in Dr Marjean Donna , called Dr Everette Rank retired in 2016 and clinic is not accepting new patients.   Patient interested in [facility] , called they are not accepting new patients.   Called [facility] [phone] called and left voicemail.   Tommi Rumps with [facility] can accept patient once PCP established   Rudene Christians with [facility] for walker    Recalled Dr Cindie Laroche office, still getting voicemail , had previous left a message.  Called [facility] again  [phone] called and left voicemail.  Dr Sallee Lange not accepting new patient's.   Dr Nevada Crane office spoke with Luetta Nutting , Dr Nevada Crane is accepting new patient's however before appointment can be made patient must go in person and pick up a new patient packet and complete.   Patient asked NCM to try DR ZRAQT [phone] . NCM called and was able to schedule an appoointment for September 03, 2020 at 1030 am patient to arrive at 1000am . Placed on AVS. Called patient no answer.   Spoke to [facility] Director on call. [facility] can call Director on call for any concerns/orders prior to appointment with Dr Legrand Rams   Expected Discharge Plan: [place]     Patient Goals and CMS Choice Patient states their goals for this hospitalization and ongoing recovery are:: to return to home CMS Medicare.gov Compare Post Acute Care list provided to::  Patient Choice offered to / list presented to : Patient  Expected Discharge Plan and Services Expected Discharge Plan: [place]   Discharge Planning Services: CM Consult Post Acute Care Choice: [place] arrangements for the past 2 months: Single Family Home                 DME Arranged: Walker rolling DME Agency: [employer]       HH Arranged: OT, PT          Prior Living Arrangements/Services Living arrangements for the past 2 months: Single Family Home Lives with:: Significant Other   Do you feel safe going back to the place where you live?: Yes               Activities of Daily Living Home Assistive Devices/Equipment: None ADL Screening (condition at time of admission) Patient's cognitive ability adequate to safely complete daily activities?: Yes Is the patient deaf or have difficulty hearing?: No Does the patient have difficulty seeing, even when wearing glasses/contacts?: No Does the patient have difficulty concentrating, remembering, or making decisions?: No Patient able to express need for assistance with ADLs?: Yes Does the patient have difficulty dressing or bathing?: No Independently performs ADLs?: Yes (appropriate for developmental age) Does the patient have difficulty walking or climbing stairs?: No Weakness of Legs: Both Weakness of Arms/Hands: None  Permission Sought/Granted  Permission granted to share information with : No              Emotional Assessment              Admission diagnosis:  Wrist pain [M25.539] Acute CVA (cerebrovascular accident) ([place]) [V90.6] Alcoholic intoxication without complication ([place]) [U93.406] Cerebrovascular accident (CVA), unspecified mechanism ([place]) [I63.9] Patient Active Problem List   Diagnosis Date Noted  . Acute CVA (cerebrovascular accident) ([place]) 08/28/2020  . Essential hypertension 08/28/2020   PCP:  Patient, No Pcp Per Pharmacy:   [place], [place] - [address] [place] [address] Phone: [phone] Fax: [phone]     Social Determinants of Health (SDOH) Interventions    Readmission Risk Interventions No flowsheet data found.

## 2020-08-29 NOTE — Progress Notes (Signed)
Pt IV removed, cath intact. Tele removed, ccmd aware. Pt has all belongings. Pt understands d/c instructions. Pt d/c via wheelchair via NT.

## 2020-08-29 NOTE — Discharge Summary (Signed)
Physician Discharge Summary  Larry Nichols WER:[id number] DOB: 1961-03-01 DOA: 08/28/2020  PCP: Patient, No Pcp Per  Admit date: 08/28/2020 Discharge date: 08/29/2020  Admitted From: Home. Disposition: Home  Recommendations for Outpatient Follow-up:  1. Follow ups as below. 2. Please obtain CBC/BMP/Mag at follow up 3. Please follow up on the following pending results: Hemoglobin A1c  Home Health: PT/OT Equipment/Devices: Rolling walker  Discharge Condition: Stable CODE STATUS: Full code   Follow-up Information    [facility] Follow up in 4 week(s).   Specialty: Neurology Why: stroke clinic. office will call with appt date and time Contact information: [address] [place], [place] Follow up.   Specialty: Internal Medicine Why: Tuesday September 03, 2020 at 1030 arrive at 1000 am  Contact information: [place] [address] [phone]        [facility], [facility] Follow up.   Specialty: Home Health Services Contact information: [place] [address] [phone]                Hospital Course: 59 year old male with history of alcohol abuse, HTN not on meds, tobacco use disorder and left-sided body weakness presenting after what looks like a syncopal episode that lasted few seconds.  Apparently, he had right-sided weakness which seems to be chronic.  He came in as a code stroke.  Neurology consulted.  He had unrevealing stroke work-up including CTA head and neck, MRI brain, echocardiogram and UDS.  He was hypertensive to 170's/110s but improved to 160s/90s.  BMP, troponin and EKG within normal.  No events on telemetry.  Basic labs including CBC and CMP within normal.  LDL 116.  Hemoglobin A1c was pending at the time of discharge but no hyperglycemia on his BMP/CMP.  Patient was started on amlodipine for blood pressure.   Started on atorvastatin.  Neurology recommended Plavix and aspirin for 3 weeks followed by aspirin alone.  He was counseled on tobacco cessation and alcohol moderation/cessation.   Of note, right wrist x-ray and right knee x-ray without acute finding but chronic deformity in right wrist suggestive for chronic fracture or injury.  Home health PT/OT and rolling walker ordered as recommended by therapy.  See individual problem list below for more hospital course.  Discharge Diagnoses:  Possible syncope: CVA cardiac work-up negative.   -Consider event monitor if this recurs. -Optimize risk reduction.  TIA:  right paresthesia in upper and lower extremities.  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 CVA work-up negative as above. -Optimize risk reduction -Plavix and low-dose aspirin for 3 weeks followed by aspirin alone.  Hypertensive urgency: Improved.  Not in meds at home. -Started on amlodipine 10 mg daily  Dyslipidemia: LDL 116. -Atorvastatin 40 mg daily  Tobacco use disorder: Reports smoking about half a pack a day. -Encouraged tobacco cessation -Provided with resources.  Alcohol abuse: Reports drinking about 48 ounces of beers a day -Encourage moderation/cessation.    Body mass index is 29.84 kg/m.            Discharge Exam: Vitals:   08/29/20 0928 08/29/20 1143  BP: (!) 164/99 (!) 163/99  Pulse: 96 89  Resp:  18  Temp:  (!) 97.5 F (36.4 C)  SpO2:  98%    GENERAL: No apparent distress.  Nontoxic. HEENT: MMM.  Vision and hearing grossly intact.  NECK: Supple.  No apparent  JVD.  RESP:  No IWOB.  Fair aeration bilaterally. CVS:  RRR. Heart sounds normal.  ABD/GI/GU: Bowel sounds present. Soft. Non tender.  MSK/EXT:  Moves extremities. No apparent deformity. No edema.  SKIN: no apparent skin lesion or wound NEURO: Awake, alert and oriented appropriately.  No apparent focal neuro deficit other than right upper extremity and right lower extremity  paresthesia.  Finger-to-nose intact.  No pronator drift. PSYCH: Calm. Normal affect.  Discharge Instructions  Discharge Instructions    Ambulatory referral to Neurology   Complete by: As directed    Follow up in stroke clinic at [facility] with Frann Rider, NP in about 4 weeks. If not available, consider Dr. Antony Contras, Dr. Bess Harvest, or Dr. Sarina Ill.   Call MD for:  difficulty breathing, headache or visual disturbances   Complete by: As directed    Call MD for:  extreme fatigue   Complete by: As directed    Call MD for:  persistant dizziness or light-headedness   Complete by: As directed    Diet - low sodium heart healthy   Complete by: As directed    Discharge instructions   Complete by: As directed    It has been a pleasure taking care of you!  You were hospitalized after you have blacked out.  Your blood pressure was high but it is not quite clear why you blacked out based on the test is we have done.  We have started you amlodipine, atorvastatin, clopidogrel and aspirin.  Please on medications to control your blood pressure, cholesterol, and reduce your future risk of stroke and heart attack.  It is very important that you take these medications as prescribed.  We strongly recommend avoiding any over-the-counter pain medication other than plain Tylenol while taking these medications.  In addition to taking your medications, will recommend lifestyle change including diet and exercise.  It is also very important that you quit smoking cigarettes.  You may use nicotine patch to help you quit smoking.  Nicotine patch is available over-the-counter.  You may also discuss other options to help you quit smoking with your primary care doctor. You can also talk to professional counselors at 1-800-QUIT-NOW [phone]) for free smoking cessation counseling.  We also recommend moderation or alcohol cessation.  We do not recommend drinking more than 24 ounces of  beer a day.   Please follow-up with your primary care doctor in 1 to 2 weeks.   Take care,   Increase activity slowly   Complete by: As directed      Allergies as of 08/29/2020   No Known Allergies     Medication List    STOP taking these medications   cyclobenzaprine 10 MG tablet Commonly known as: FLEXERIL   dexamethasone 4 MG tablet Commonly known as: DECADRON   HYDROcodone-acetaminophen 5-325 MG tablet Commonly known as: NORCO/VICODIN   ibuprofen 200 MG tablet Commonly known as: ADVIL   ibuprofen 600 MG tablet Commonly known as: ADVIL   metaxalone 400 MG tablet Commonly known as: SKELAXIN   methocarbamol 500 MG tablet Commonly known as: ROBAXIN   naproxen 500 MG tablet Commonly known as: NAPROSYN   oxyCODONE-acetaminophen 5-325 MG tablet Commonly known as: PERCOCET/ROXICET   predniSONE 10 MG tablet Commonly known as: DELTASONE     TAKE these medications   amLODipine 10 MG tablet Commonly known as: NORVASC Take 1 tablet (10 mg total) by mouth daily.   aspirin 81 MG EC tablet Take 1 tablet (81  mg total) by mouth daily. Start taking on: August 30, 2020   atorvastatin 40 MG tablet Commonly known as: LIPITOR Take 1 tablet (40 mg total) by mouth daily. Start taking on: August 30, 2020   clopidogrel 75 MG tablet Commonly known as: PLAVIX Take 1 tablet (75 mg total) by mouth daily. Start taking on: August 30, 2020            Durable Medical Equipment  (From admission, onward)         Start     Ordered   08/29/20 1225  For home use only DME Walker rolling  Once       Question Answer Comment  Walker: With [employer] Wheels   Patient needs a walker to treat with the following condition Unsteady gait      08/29/20 1224          Consultations:  Neurology  Procedures/Studies:  2D Echo on 08/29/2020 1. Left ventricular ejection fraction, by estimation, is 65 to 70%. The  left ventricle has normal function. The left ventricle has no  regional  wall motion abnormalities. There is moderate left ventricular hypertrophy.  Left ventricular diastolic  parameters are consistent with Grade I diastolic dysfunction (impaired  relaxation).  2. Right ventricular systolic function is normal. The right ventricular  size is normal. Tricuspid regurgitation signal is inadequate for assessing  PA pressure.  3. The mitral valve is normal in structure. No evidence of mitral valve  regurgitation. No evidence of mitral stenosis.  4. The aortic valve is tricuspid. Aortic valve regurgitation is not  visualized. No aortic stenosis is present.  5. The inferior vena cava is normal in size with greater than 50%  respiratory variability, suggesting right atrial pressure of 3 mmHg.   CT Code Stroke CTA Head W/WO contrast  Result Date: 08/28/2020 CLINICAL DATA:  Code stroke follow-up EXAM: CT ANGIOGRAPHY HEAD AND NECK TECHNIQUE: Multidetector CT imaging of the head and neck was performed using the standard protocol during bolus administration of intravenous contrast. Multiplanar CT image reconstructions and MIPs were obtained to evaluate the vascular anatomy. Carotid stenosis measurements (when applicable) are obtained utilizing NASCET criteria, using the distal internal carotid diameter as the denominator. CONTRAST:  75 mL Omnipaque 350 COMPARISON:  None. FINDINGS: CTA NECK Aortic arch: Great vessel origins are patent. Right carotid system: Patent. Primarily calcified plaque at the common carotid bifurcation and proximal internal carotid causing minimal stenosis. Left carotid system: Patent. Mild atherosclerotic irregularity of the common carotid. Mild calcified plaque at the common carotid bifurcation. No measurable stenosis at the ICA origin. Vertebral arteries: Patent. Right vertebral artery is minimally dominant. No measurable stenosis or evidence of dissection. Skeleton: Cervical spine degenerative changes primarily at C5-C6 and C6-C7. Other neck:  No mass or adenopathy. Upper chest: No apical lung mass. Review of the MIP images confirms the above findings CTA HEAD Anterior circulation: Intracranial internal carotid arteries are patent. Anterior and middle cerebral arteries are patent. Mild atherosclerosis of the left M1 MCA. Posterior circulation: Intracranial vertebral arteries, basilar artery, and posterior cerebral arteries are patent. There are bilateral posterior communicating arteries present. Venous sinuses: Patent as allowed by contrast bolus timing. Review of the MIP images confirms the above findings IMPRESSION: No large vessel occlusion or hemodynamically significant stenosis. Mild left M1 MCA atherosclerosis. Electronically Signed   By: Macy Mis M.D.   On: 08/28/2020 18:07   DG Wrist 2 Views Right  Result Date: 08/28/2020 CLINICAL DATA:  Wrist pain EXAM: RIGHT  WRIST - 2 VIEW COMPARISON:  None. FINDINGS: No acute displaced fracture or malalignment. Corticated bony opacity dorsal aspect of wrist may relate to old fracture. Bony protuberance off the distal scaphoid. Moderate degenerative changes at the radiocarpal joint. IMPRESSION: 1. No acute osseous abnormality. 2. 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 3. Dorsal soft tissue swelling. Probable old fracture deformity at the dorsal wrist. Bony protuberance at the mid to distal scaphoid, potentially due to old trauma or atypical location of exostosis. Electronically Signed   By: Donavan Foil M.D.   On: 08/28/2020 22:51   DG Knee 1-2 Views Right  Result Date: 08/28/2020 CLINICAL DATA:  Right knee pain EXAM: RIGHT KNEE - 1-2 VIEW COMPARISON:  Tibia fibula radiograph 12/10/2012, knee 04/01/2010 FINDINGS: No evidence of fracture, dislocation, or joint effusion. No evidence of arthropathy or other focal bone abnormality. Soft tissues are unremarkable. IMPRESSION: Negative. Electronically Signed   By: Donavan Foil M.D.   On: 08/28/2020 22:47   CT Code Stroke CTA Neck W/WO  contrast  Result Date: 08/28/2020 CLINICAL DATA:  Code stroke follow-up EXAM: CT ANGIOGRAPHY HEAD AND NECK TECHNIQUE: Multidetector CT imaging of the head and neck was performed using the standard protocol during bolus administration of intravenous contrast. Multiplanar CT image reconstructions and MIPs were obtained to evaluate the vascular anatomy. Carotid stenosis measurements (when applicable) are obtained utilizing NASCET criteria, using the distal internal carotid diameter as the denominator. CONTRAST:  75 mL Omnipaque 350 COMPARISON:  None. FINDINGS: CTA NECK Aortic arch: Great vessel origins are patent. Right carotid system: Patent. Primarily calcified plaque at the common carotid bifurcation and proximal internal carotid causing minimal stenosis. Left carotid system: Patent. Mild atherosclerotic irregularity of the common carotid. Mild calcified plaque at the common carotid bifurcation. No measurable stenosis at the ICA origin. Vertebral arteries: Patent. Right vertebral artery is minimally dominant. No measurable stenosis or evidence of dissection. Skeleton: Cervical spine degenerative changes primarily at C5-C6 and C6-C7. Other neck: No mass or adenopathy. Upper chest: No apical lung mass. Review of the MIP images confirms the above findings CTA HEAD Anterior circulation: Intracranial internal carotid arteries are patent. Anterior and middle cerebral arteries are patent. Mild atherosclerosis of the left M1 MCA. Posterior circulation: Intracranial vertebral arteries, basilar artery, and posterior cerebral arteries are patent. There are bilateral posterior communicating arteries present. Venous sinuses: Patent as allowed by contrast bolus timing. Review of the MIP images confirms the above findings IMPRESSION: No large vessel occlusion or hemodynamically significant stenosis. Mild left M1 MCA atherosclerosis. Electronically Signed   By: Macy Mis M.D.   On: 08/28/2020 18:07   MR BRAIN WO  CONTRAST  Result Date: 08/28/2020 CLINICAL DATA:  Right-sided weakness, code stroke follow-up EXAM: MRI HEAD WITHOUT CONTRAST TECHNIQUE: Multiplanar, multiecho pulse sequences of the brain and surrounding structures were obtained without intravenous contrast. COMPARISON:  None. FINDINGS: Brain: There is no acute infarction or intracranial hemorrhage. There is no intracranial mass, mass effect, or edema. There is no hydrocephalus or extra-axial fluid collection. Ventricles and sulci are normal in size and configuration. A few small foci of T2 hyperintensity in the supratentorial white matter are nonspecific but may reflect minor chronic microvascular ischemic changes. Vascular: Major vessel flow voids at the skull base are preserved. Skull and upper cervical spine: Normal marrow signal is preserved. Sinuses/Orbits: Mild ethmoid mucosal thickening. Orbits are unremarkable. Other: Sella is unremarkable.  Mastoid air cells are clear. IMPRESSION: No acute infarction, hemorrhage, or mass. Electronically Signed   By: Malachi Carl  Patel M.D.   On: 08/28/2020 20:07   ECHOCARDIOGRAM COMPLETE  Result Date: 08/29/2020    ECHOCARDIOGRAM REPORT   Patient Name:   Larry Nichols Date of Exam: 08/29/2020 Medical Rec #:  [id number]      Height:       72.0 in Accession #:    [id number]     Weight:       220.0 lb Date of Birth:  Dec 19, 1960      BSA:          2.219 m Patient Age:    67 years       BP:           170/114 mmHg Patient Gender: M              HR:           98 bpm. Exam Location:  Inpatient Procedure: 2D Echo, Cardiac Doppler and Color Doppler Indications:    Stroke 434.91 / I163.9  History:        Patient has no prior history of Echocardiogram examinations.                 Risk Factors:Hypertension.  Sonographer:    Jonelle Sidle Dance Referring Phys: [place]  1. Left ventricular ejection fraction, by estimation, is 65 to 70%. The left ventricle has normal function. The left ventricle has no  regional wall motion abnormalities. There is moderate left ventricular hypertrophy. Left ventricular diastolic parameters are consistent with Grade I diastolic dysfunction (impaired relaxation).  2. Right ventricular systolic function is normal. The right ventricular size is normal. Tricuspid regurgitation signal is inadequate for assessing PA pressure.  3. The mitral valve is normal in structure. No evidence of mitral valve regurgitation. No evidence of mitral stenosis.  4. The aortic valve is tricuspid. Aortic valve regurgitation is not visualized. No aortic stenosis is present.  5. The inferior vena cava is normal in size with greater than 50% respiratory variability, suggesting right atrial pressure of 3 mmHg. FINDINGS  Left Ventricle: Left ventricular ejection fraction, by estimation, is 65 to 70%. The left ventricle has normal function. The left ventricle has no regional wall motion abnormalities. The left ventricular internal cavity size was normal in size. There is  moderate left ventricular hypertrophy. Left ventricular diastolic parameters are consistent with Grade I diastolic dysfunction (impaired relaxation). Right Ventricle: The right ventricular size is normal. No increase in right ventricular wall thickness. Right ventricular systolic function is normal. Tricuspid regurgitation signal is inadequate for assessing PA pressure. Left Atrium: Left atrial size was normal in size. Right Atrium: Right atrial size was normal in size. Pericardium: There is no evidence of pericardial effusion. Mitral Valve: The mitral valve is normal in structure. No evidence of mitral valve regurgitation. No evidence of mitral valve stenosis. Tricuspid Valve: The tricuspid valve is normal in structure. Tricuspid valve regurgitation is not demonstrated. Aortic Valve: The aortic valve is tricuspid. Aortic valve regurgitation is not visualized. No aortic stenosis is present. Pulmonic Valve: The pulmonic valve was normal in  structure. Pulmonic valve regurgitation is not visualized. Aorta: The aortic root is normal in size and structure. Venous: The inferior vena cava is normal in size with greater than 50% respiratory variability, suggesting right atrial pressure of 3 mmHg. IAS/Shunts: No atrial level shunt detected by color flow Doppler.  LEFT VENTRICLE PLAX 2D LVIDd:         3.97 cm  Diastology LVIDs:  2.82 cm  LV e' medial:    5.13 cm/s LV PW:         1.41 cm  LV E/e' medial:  12.5 LV IVS:        1.43 cm  LV e' lateral:   6.53 cm/s LVOT diam:     2.40 cm  LV E/e' lateral: 9.8 LV SV:         99 LV SV Index:   44 LVOT Area:     4.52 cm  RIGHT VENTRICLE             IVC RV Basal diam:  2.77 cm     IVC diam: 1.85 cm RV S prime:     14.30 cm/s TAPSE (M-mode): 2.6 cm LEFT ATRIUM             Index       RIGHT ATRIUM           Index LA diam:        3.00 cm 1.35 cm/m  RA Area:     14.80 cm LA Vol (A2C):   74.0 ml 33.35 ml/m RA Volume:   36.50 ml  16.45 ml/m LA Vol (A4C):   48.7 ml 21.95 ml/m LA Biplane Vol: 62.7 ml 28.26 ml/m  AORTIC VALVE LVOT Vmax:   113.00 cm/s LVOT Vmean:  80.650 cm/s LVOT VTI:    0.218 m  AORTA Ao Root diam: 3.50 cm Ao Asc diam:  3.20 cm MITRAL VALVE MV Area (PHT): 2.66 cm    SHUNTS MV Decel Time: 285 msec    Systemic VTI:  0.22 m MV E velocity: 64.30 cm/s  Systemic Diam: 2.40 cm MV A velocity: 86.90 cm/s MV E/A ratio:  0.74 Loralie Champagne MD Electronically signed by Loralie Champagne MD Signature Date/Time: 08/29/2020/3:18:44 PM    Final    CT HEAD CODE STROKE WO CONTRAST  Result Date: 08/28/2020 CLINICAL DATA:  Code stroke. EXAM: CT HEAD WITHOUT CONTRAST TECHNIQUE: Contiguous axial images were obtained from the base of the skull through the vertex without intravenous contrast. COMPARISON:  05/23/2019 FINDINGS: Brain: There is no acute intracranial hemorrhage, mass effect, or edema. Gray-white differentiation is preserved. Ventricles and sulci are normal in size and configuration. There is no extra-axial  collection. Vascular: No hyperdense vessel. Skull: Unremarkable. Sinuses/Orbits: No acute abnormality. Other: Mastoid air cells are clear. ASPECTS ([place] Stroke Program Early CT Score) - Ganglionic level infarction (caudate, lentiform nuclei, internal capsule, insula, M1-M3 cortex): 7 - Supraganglionic infarction (M4-M6 cortex): 3 Total score (0-10 with 10 being normal): 10 IMPRESSION: There is no acute intracranial hemorrhage or evidence of acute infarction. ASPECT score is 10. These results were communicated to Dr. Leonel Ramsay at 5:58 pm on 08/28/2020 by text page via the [facility] messaging system. Electronically Signed   By: Macy Mis M.D.   On: 08/28/2020 17:59        The results of significant diagnostics from this hospitalization (including imaging, microbiology, ancillary and laboratory) are listed below for reference.     Microbiology: Recent Results (from the past 240 hour(s))  Respiratory Panel by RT PCR (Flu A&B, Covid) - Nasopharyngeal Swab     Status: None   Collection Time: 08/28/20 10:30 PM   Specimen: Nasopharyngeal Swab  Result Value Ref Range Status   SARS Coronavirus 2 by RT PCR NEGATIVE NEGATIVE Final    Comment: (NOTE) SARS-CoV-2 target nucleic acids are NOT DETECTED.  The SARS-CoV-2 RNA is generally detectable in upper respiratoy specimens during the acute  phase of infection. The lowest concentration of SARS-CoV-2 viral copies this assay can detect is 131 copies/mL. A negative result does not preclude SARS-Cov-2 infection and should not be used as the sole basis for treatment or other patient management decisions. A negative result may occur with  improper specimen collection/handling, submission of specimen other than nasopharyngeal swab, presence of viral mutation(s) within the areas targeted by this assay, and inadequate number of viral copies (<131 copies/mL). A negative result must be combined with clinical observations, patient history, and  epidemiological information. The expected result is Negative.  Fact Sheet for Patients:  PinkCheek.be  Fact Sheet for Healthcare Providers:  GravelBags.it  This test is no t yet approved or cleared by the [place] FDA and  has been authorized for detection and/or diagnosis of SARS-CoV-2 by FDA under an Emergency Use Authorization (EUA). This EUA will remain  in effect (meaning this test can be used) for the duration of the COVID-19 declaration under Section 564(b)(1) of the Act, 21 U.S.C. section 360bbb-3(b)(1), unless the authorization is terminated or revoked sooner.     Influenza A by PCR NEGATIVE NEGATIVE Final   Influenza B by PCR NEGATIVE NEGATIVE Final    Comment: (NOTE) The Xpert Xpress SARS-CoV-2/FLU/RSV assay is intended as an aid in  the diagnosis of influenza from Nasopharyngeal swab specimens and  should not be used as a sole basis for treatment. Nasal washings and  aspirates are unacceptable for Xpert Xpress SARS-CoV-2/FLU/RSV  testing.  Fact Sheet for Patients: PinkCheek.be  Fact Sheet for Healthcare Providers: GravelBags.it  This test is not yet approved or cleared by the [place] FDA and  has been authorized for detection and/or diagnosis of SARS-CoV-2 by  FDA under an Emergency Use Authorization (EUA). This EUA will remain  in effect (meaning this test can be used) for the duration of the  Covid-19 declaration under Section 564(b)(1) of the Act, 21  U.S.C. section 360bbb-3(b)(1), unless the authorization is  terminated or revoked. Performed at [facility], [place] [address], [place], [place] [address]      Labs: BNP (last 3 results) No results for input(s): BNP in the last 8760 hours. Basic Metabolic Panel: Recent Labs  Lab 08/28/20 1749 08/28/20 1753 08/28/20 2029 08/29/20 0436  NA 138 140  --  138  K 4.0 4.1  --   3.5  CL 102 102  --  104  CO2  --  27  --  25  GLUCOSE 100* 102*  --  91  BUN 7 6  --  7  CREATININE 1.30* 0.99 0.93 0.79  CALCIUM  --  9.7  --  9.4   Liver Function Tests: Recent Labs  Lab 08/28/20 1753 08/29/20 0436  AST 58* 50*  ALT 40 41  ALKPHOS 71 68  BILITOT 0.8 0.7  PROT 7.0 6.8  ALBUMIN 4.2 4.0   No results for input(s): LIPASE, AMYLASE in the last 168 hours. No results for input(s): AMMONIA in the last 168 hours. CBC: Recent Labs  Lab 08/28/20 1749 08/28/20 1753 08/28/20 2029 08/29/20 0436  WBC  --  7.3 7.3 6.0  NEUTROABS  --  3.5  --   --   HGB 15.6 14.8 15.1 14.7  HCT 46.0 44.2 45.2 43.6  MCV  --  94.4 95.4 93.2  PLT  --  360 368 377   Cardiac Enzymes: Recent Labs  Lab 08/29/20 0457  CKTOTAL 383   BNP: Invalid input(s): POCBNP CBG: Recent Labs  Lab 08/28/20 1743  GLUCAP 90   D-Dimer No results for input(s): DDIMER in the last 72 hours. Hgb A1c No results for input(s): HGBA1C in the last 72 hours. Lipid Profile Recent Labs    08/29/20 0436  CHOL 221*  HDL 79  LDLCALC 116*  TRIG 129  CHOLHDL 2.8   Thyroid function studies No results for input(s): TSH, T4TOTAL, T3FREE, THYROIDAB in the last 72 hours.  Invalid input(s): FREET3 Anemia work up No results for input(s): VITAMINB12, FOLATE, FERRITIN, TIBC, IRON, RETICCTPCT in the last 72 hours. Urinalysis    Component Value Date/Time   COLORURINE COLORLESS (A) 08/28/2020 1851   APPEARANCEUR CLEAR 08/28/2020 1851   LABSPEC 1.004 (L) 08/28/2020 1851   PHURINE 7.0 08/28/2020 1851   GLUCOSEU NEGATIVE 08/28/2020 1851   HGBUR SMALL (A) 08/28/2020 1851   BILIRUBINUR NEGATIVE 08/28/2020 1851   KETONESUR NEGATIVE 08/28/2020 1851   PROTEINUR NEGATIVE 08/28/2020 1851   NITRITE NEGATIVE 08/28/2020 1851   LEUKOCYTESUR NEGATIVE 08/28/2020 1851   Sepsis Labs Invalid input(s): PROCALCITONIN,  WBC,  LACTICIDVEN   Time coordinating discharge: 35 minutes  SIGNED:  Mercy Riding, MD  [facility] 08/29/2020, 4:13 PM  If 7PM-7AM, please contact night-coverage [email or web address]

## 2020-08-29 NOTE — Evaluation (Addendum)
Occupational Therapy Evaluation Patient Details Name: Larry Nichols MRN: [id number] DOB: 15-Jun-1961 Today's Date: 08/29/2020    History of Present Illness Pt is a 59 y/o male with PMH of arthritis, HTN, presenting with acute right hemiparesis and hypoesthesia most consistent with small ischemic stroke per neurology.    Clinical Impression   PTA patient reports independent and working, pt reports caring for his girlfriend as well.  Admitted for above and limited by problem list below, including dizziness, impaired balance, and decreased activity tolerance. Session limited by hypertension, see below for details.  Patient completing bed mobility with min guard assist, ADLs with min-supervision, and stepping towards HOB with min guard assist.  Cognition appears WFL, pt voices blurred vision. R UE with mild dis-coordination, but functional; pt reports R UE "feels heavy".   Believe he will benefit from further OT services while admitted and after dc at [facility] level at this time due to limited session, but may progress to no follow up as able to participate more.  Will follow acutely.    BP EOB 185/131, checked again 193/111 BP supine 193/112--RN aware and present    Follow Up Recommendations  Home health OT    Equipment Recommendations  None recommended by OT    Recommendations for Other Services       Precautions / Restrictions Precautions Precautions: Fall Restrictions Weight Bearing Restrictions: No      Mobility Bed Mobility Overal bed mobility: Needs Assistance Bed Mobility: Supine to Sit;Sit to Supine     Supine to sit: Min guard Sit to supine: Min guard   General bed mobility comments: for safety    Transfers Overall transfer level: Needs assistance Equipment used: None Transfers: Sit to/from Stand Sit to Stand: Min guard         General transfer comment: for safety/balance     Balance Overall balance assessment: Needs assistance Sitting-balance support: No  upper extremity supported;Feet supported Sitting balance-Leahy Scale: Good Sitting balance - Comments: min guard for safety    Standing balance support: During functional activity;No upper extremity supported Standing balance-Leahy Scale: Fair Standing balance comment: preference for UE support holding bedrail                           ADL either performed or assessed with clinical judgement   ADL Overall ADL's : Needs assistance/impaired     Grooming: Set up;Sitting   Upper Body Bathing: Set up;Sitting   Lower Body Bathing: Minimal assistance;Sit to/from stand   Upper Body Dressing : Set up;Sitting   Lower Body Dressing: Minimal assistance;Sit to/from stand Lower Body Dressing Details (indicate cue type and reason): dizziness with forward lean, but able to adjust socks with supervision; min guard stand at [employer] Transfer Details (indicate cue type and reason): deferred          Functional mobility during ADLs: Min guard General ADL Comments: limited by dizziness, impaired balance and elevated BP      Vision Baseline Vision/History: Wears glasses Wears Glasses: Reading only Patient Visual Report: Blurring of vision Additional Comments: pt able to read clock and name badge, but reports blurry --further assesment required      Perception     Praxis      Pertinent Vitals/Pain Pain Assessment: 0-10 Pain Score: 6  Pain Location: headache Pain Descriptors / Indicators: Headache Pain Intervention(s): Limited activity within patient's tolerance;Monitored during session;Repositioned (RN notified )     Hand Dominance  (  ambidextrous.)   Extremity/Trunk Assessment Upper Extremity Assessment Upper Extremity Assessment: RUE deficits/detail RUE Deficits / Details: mild decreased coordination, pt reports "feels heavy" functional use    Lower Extremity Assessment Lower Extremity Assessment: Defer to PT evaluation       Communication  Communication Communication: No difficulties   Cognition Arousal/Alertness: Awake/alert Behavior During Therapy: WFL for tasks assessed/performed Overall Cognitive Status: Within Functional Limits for tasks assessed                                 General Comments: appears [facility]    General Comments  limited session due to elevated BP-- EOB 185/131 (147), EOB 193/111 (130), supine 193/112 (127)--RN notified and present     Exercises     Shoulder Instructions      Home Living Family/patient expects to be discharged to:: Private residence Living Arrangements: Spouse/significant other (girlfriend ) Available Help at Discharge: Family;Available 24 hours/day Type of Home: Mobile home Home Access: Stairs to enter Entrance Stairs-Number of Steps: 3 Entrance Stairs-Rails: Left Home Layout: One level     Bathroom Shower/Tub: Teacher, early years/pre: Standard     Home Equipment: Tub bench;Grab bars - tub/shower;Bedside commode          Prior Functioning/Environment Level of Independence: Independent        Comments: working, driving, cares for his girlfriend (physical assist required--she uses a walker)         OT Problem List: Decreased activity tolerance;Impaired balance (sitting and/or standing);Impaired vision/perception;Decreased coordination;Decreased cognition;Decreased safety awareness;Decreased knowledge of use of DME or AE;Cardiopulmonary status limiting activity;Pain      OT Treatment/Interventions: Self-care/ADL training;DME and/or AE instruction;Therapeutic activities;Balance training;Patient/family education;Cognitive remediation/compensation    OT Goals(Current goals can be found in the care plan section) Acute Rehab OT Goals Patient Stated Goal: to feel better OT Goal Formulation: With patient Time For Goal Achievement: 09/12/20 Potential to Achieve Goals: Good  OT Frequency: Min 2X/week   Barriers to D/C:             Co-evaluation              AM-PAC OT "6 Clicks" Daily Activity     Outcome Measure Help from another person eating meals?: A Little Help from another person taking care of personal grooming?: A Little Help from another person toileting, which includes using toliet, bedpan, or urinal?: A Little Help from another person bathing (including washing, rinsing, drying)?: A Little Help from another person to put on and taking off regular upper body clothing?: A Little Help from another person to put on and taking off regular lower body clothing?: A Little 6 Click Score: 18   End of Session Nurse Communication: Mobility status  Activity Tolerance: Patient limited by pain (headache) Patient left: in bed;with call bell/phone within reach;with bed alarm set;with nursing/sitter in room  OT Visit Diagnosis: Other abnormalities of gait and mobility (R26.89);Dizziness and giddiness (R42);Pain;Other symptoms and signs involving the nervous system (R29.898) Pain - part of body:  (headache)                Time: 4008-6761 OT Time Calculation (min): 26 min Charges:  OT General Charges $OT Visit: 1 Visit OT Evaluation $OT Eval Moderate Complexity: 1 Mod OT Treatments $Self Care/Home Management : 8-22 mins  Jolaine Artist, OT Acute Rehabilitation Services Pager [phone] Office [phone]   Delight Stare 08/29/2020, 8:57 AM
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
## 2020-08-29 NOTE — Evaluation (Signed)
Physical Therapy Evaluation Patient Details Name: Larry Nichols MRN: [id number] DOB: 01/13/1961 Today's Date: 08/29/2020   History of Present Illness  Pt is a 59 y/o male with PMH of arthritis, HTN, presenting with acute right hemiparesis and hypoesthesia most consistent with small ischemic stroke per neurology.   Clinical Impression   Patient received in bed, RN present and reports having given medication for BP. BP monitored during session, and while elevated, not to the extent it was earlier with OT. Able to mobilize in room on a min guard level with no device, however does demonstrate impaired sensation/proprioception R LE as well as mild weakness in RLE in general, able to maintain balance but gait grossly unsteady. Felt woozy during mobility. Left in bed with all needs met, bed alarm active. Would likely benefit from skilled HHPT services moving forward.   BP supine 172/90, sitting 160/100, standing 159/92; HR WNL, still symptomatic and woozy- RN aware.     Follow Up Recommendations Home health PT    Equipment Recommendations  Rolling walker with 5" wheels    Recommendations for Other Services       Precautions / Restrictions Precautions Precautions: Fall Restrictions Weight Bearing Restrictions: No      Mobility  Bed Mobility Overal bed mobility: Needs Assistance Bed Mobility: Supine to Sit;Sit to Supine     Supine to sit: Supervision Sit to supine: Supervision   General bed mobility comments: for safety and line management    Transfers Overall transfer level: Needs assistance Equipment used: None Transfers: Sit to/from Stand Sit to Stand: Min guard         General transfer comment: for safety/balance   Ambulation/Gait Ambulation/Gait assistance: Min guard Gait Distance (Feet): 20 Feet Assistive device: None Gait Pattern/deviations: Step-through pattern;Drifts right/left;Trunk flexed;Narrow base of support;Scissoring Gait velocity: decreased    General Gait Details: reports "I feel drunk and woozy" with gait; able to gait train in room but limited distance due to HA/wooziness. Min guard but overall unsteady gait pattern without UE support  Stairs            Wheelchair Mobility    Modified Rankin (Stroke Patients Only)       Balance Overall balance assessment: Needs assistance Sitting-balance support: No upper extremity supported;Feet supported Sitting balance-Leahy Scale: Good Sitting balance - Comments: min guard for safety    Standing balance support: During functional activity;No upper extremity supported Standing balance-Leahy Scale: Fair Standing balance comment: preference for UE support holding bedrail                             Pertinent Vitals/Pain Pain Assessment: 0-10 Pain Score: 5  Pain Location: headache Pain Descriptors / Indicators: Headache Pain Intervention(s): Limited activity within patient's tolerance;Monitored during session;Premedicated before session    Home Living Family/patient expects to be discharged to:: Private residence Living Arrangements: Spouse/significant other (girlfriend ) Available Help at Discharge: Family;Available 24 hours/day Type of Home: Mobile home Home Access: Stairs to enter Entrance Stairs-Rails: Left Entrance Stairs-Number of Steps: 3 Home Layout: One level Home Equipment: Tub bench;Grab bars - tub/shower;Bedside commode      Prior Function Level of Independence: Independent         Comments: working, driving, cares for his girlfriend (physical assist required--she uses a walker)      Hand Dominance   Dominant Hand:  (ambidextrous.)    Extremity/Trunk Assessment   Upper Extremity Assessment Upper Extremity Assessment: Defer to OT evaluation  RUE Deficits / Details: mild decreased coordination, pt reports "feels heavy" functional use     Lower Extremity Assessment Lower Extremity Assessment: RLE deficits/detail RLE Deficits /  Details: mild decrease in R LE strength and coordination- just "feels heavy"; MMT grossly 4/5 quad, ankle dorsiflexors, hip flexor RLE Sensation: decreased light touch RLE Coordination: decreased fine motor    Cervical / Trunk Assessment Cervical / Trunk Assessment: Normal  Communication   Communication: No difficulties  Cognition Arousal/Alertness: Awake/alert Behavior During Therapy: WFL for tasks assessed/performed Overall Cognitive Status: Within Functional Limits for tasks assessed                                 General Comments: appears [facility]       General Comments General comments (skin integrity, edema, etc.): BP better this session (BP meds given prior to PT by nursing)- supine 172/90, sitting 160/100, standing 159/92 but still symptomatic, RN notified    Exercises     Assessment/Plan    PT Assessment Patient needs continued PT services  PT Problem List Decreased strength;Decreased knowledge of use of DME;Decreased activity tolerance;Decreased safety awareness;Decreased balance;Decreased mobility;Decreased coordination;Impaired sensation       PT Treatment Interventions DME instruction;Balance training;Gait training;Neuromuscular re-education;Stair training;Functional mobility training;Patient/family education;Therapeutic activities;Therapeutic exercise    PT Goals (Current goals can be found in the Care Plan section)  Acute Rehab PT Goals Patient Stated Goal: to feel better PT Goal Formulation: With patient Time For Goal Achievement: 09/12/20 Potential to Achieve Goals: Good    Frequency Min 3X/week   Barriers to discharge        Co-evaluation               AM-PAC PT "6 Clicks" Mobility  Outcome Measure Help needed turning from your back to your side while in a flat bed without using bedrails?: None Help needed moving from lying on your back to sitting on the side of a flat bed without using bedrails?: None Help needed moving to and from  a bed to a chair (including a wheelchair)?: A Little Help needed standing up from a chair using your arms (e.g., wheelchair or bedside chair)?: A Little Help needed to walk in hospital room?: A Little Help needed climbing 3-5 steps with a railing? : A Little 6 Click Score: 20    End of Session Equipment Utilized During Treatment: Gait belt Activity Tolerance: Patient tolerated treatment well Patient left: in bed;with call bell/phone within reach;with bed alarm set Nurse Communication: Mobility status;Other (comment) (BP during session) PT Visit Diagnosis: Unsteadiness on feet (R26.81);Difficulty in walking, not elsewhere classified (R26.2);Other symptoms and signs involving the nervous system (R29.898)    Time: 2956-2130 PT Time Calculation (min) (ACUTE ONLY): 20 min   Charges:   PT Evaluation $PT Eval Moderate Complexity: 1 Mod          Windell Norfolk, DPT, PN1   Supplemental Physical Therapist [place]    Pager [phone] Acute Rehab Office [phone]

## 2020-08-30 LAB — HEMOGLOBIN A1C
Hgb A1c MFr Bld: 5.9 % — ABNORMAL HIGH (ref 4.8–5.6)
Mean Plasma Glucose: 123 mg/dL

## 2020-09-18 DIAGNOSIS — R55 Syncope and collapse: Secondary | ICD-10-CM | POA: Diagnosis present

## 2020-10-09 ENCOUNTER — Ambulatory Visit (INDEPENDENT_AMBULATORY_CARE_PROVIDER_SITE_OTHER): Payer: 59 | Admitting: Adult Health

## 2020-10-09 ENCOUNTER — Encounter: Payer: Self-pay | Admitting: Adult Health

## 2020-10-09 ENCOUNTER — Other Ambulatory Visit: Payer: Self-pay

## 2020-10-09 VITALS — BP 155/99 | HR 84 | Ht 72.0 in | Wt 218.0 lb

## 2020-10-09 DIAGNOSIS — I10 Essential (primary) hypertension: Secondary | ICD-10-CM

## 2020-10-09 DIAGNOSIS — E785 Hyperlipidemia, unspecified: Secondary | ICD-10-CM | POA: Diagnosis not present

## 2020-10-09 DIAGNOSIS — G459 Transient cerebral ischemic attack, unspecified: Secondary | ICD-10-CM

## 2020-10-09 MED ORDER — GABAPENTIN 300 MG PO CAPS: 300.0000 mg | ORAL_CAPSULE | Freq: Two times a day (BID) | ORAL | 3 refills | Status: DC

## 2020-10-09 NOTE — Patient Instructions (Signed)
Recommend trailing gabapentin 300mg  twice daily for nerve pain post stroke and headaches  Continue working with therapies at home and once completed, consider doing additional outpatient therapies if needed  Continue aspirin 81 mg daily  and atorvastatin  for secondary stroke prevention  Continue to follow up with PCP regarding cholesterol and blood pressure management  Maintain strict control of hypertension with blood pressure goal below 130/90, diabetes with hemoglobin A1c goal below 6.5% and cholesterol with LDL cholesterol (bad cholesterol) goal below 70 mg/dL.       Followup in the future with me in 3 months or call earlier if needed       Thank you for coming to see [place] at [facility]. I hope we have been able to provide you high quality care today.  You may receive a patient satisfaction survey over the next few weeks. We would appreciate your feedback and comments so that we may continue to improve ourselves and the health of our patients.   Sensory Loss After a Stroke A stroke can damage parts of your brain that control your body's normal functions, including your senses. As a result, you may have sensory loss, such as trouble seeing, tasting, swallowing, or feeling touch or pressure sensations. You may have problems feeling temperature changes or moving your body in a coordinated way. You may also perceive smell differently. You may have problems with all of your senses or only some of them. By following a treatment plan, you may recover lost senses and manage the changes to your lifestyle. What are treatment therapies for sensory loss? You may have a combination of therapies for sensory loss.  Physical therapy. This may include: ? Exercises to improve your coordination and balance. ? Exercises that combine touch, balance, and movement (sensorimotor training). ? Movements to relieve pressure while you are sitting or lying (mobility training). ? Splints  or braces to protect parts of your body that you cannot feel.  Devices to help with blood flow (circulation) and to help stimulate nerves in affected parts of your body.  Speech therapy to help you swallow safely.  Occupational therapy to help you with everyday tasks. This may include: ? Exercises or devices to improve your vision. ? Exercises to help you increase your touch perception. These may include feeling objects of different sizes and textures while your eyes are closed. ? Techniques for moving safely in your environment.  Prescription eye glasses for vision loss.  Hearing aids for hearing loss. Follow these instructions at home: Safety   Your risk of falling is higher after a stroke. You may have difficulty feeling your legs and feet or coordinating your movements. To lower your risk of falling: ? Use devices to help you move around (assistive devices), such as a wheelchair or walker, as directed by your health care team. ? Wear prescription eye glasses at all times when moving around. ? Use lights to help you see in the dark. ? Use grab bars in bathrooms and handrails in stairways. ? Keep walkways clear in your home by removing rugs, cords, and clutter from the floor.  Your risk of getting burned is higher after a stroke. To lower your risk of burns: ? Test the water temperature before taking a bath or washing your hands. ? Allow hot foods to cool slightly before eating. ? Use potholders when handling hot pans.  When using sharp objects, such as scissors or knives, use your healthy hand. Do not handle sharp objects with  your hand that was affected by your stroke, if this applies. Activity  Return to your normal activities as told by your health care provider. Ask your health care provider what activities are safe for you.  Avoid spending too much time sitting or lying down. If you must be in a chair or bed, change positions regularly.  You may have problems doing your  normal activities. Ask your health care provider about getting extra help at home. Eating and drinking   You may have problems swallowing food and fluids after a stroke. The problems can be due to: ? Changes in your muscles. ? Sensory changes, such as:  Difficulty feeling the consistency or size of a piece of food in your mouth.  Inability to feel the need to clear your throat.  You may need to: ? Take smaller bites and chew thoroughly. Make sure you have swallowed all the food in your mouth before you take another bite. ? Sit in an upright position when eating or drinking. ? Avoid distractions while eating or drinking. ? Stay upright for 30-45 minutes after eating. ? Change the texture of some things that you eat and drink. This may include:  Changing foods to a smooth, mashed consistency (puree).  Thickening liquids.  Follow instructions from your health care provider about eating and drinking restrictions. General instructions  Wear arm or leg braces as told by your health care team.  Get help at home as needed. You may need help getting dressed, bathing, using the bathroom, eating, or doing other activities.  Keep all follow-up visits as told by your health care providers. This is important. Summary  It is common to have sensory loss after a stroke. Sensory loss means that you have problems with some or all of your senses, such as vision, taste, hearing, smell, and touch.  Sensory loss happens because of damage to your brain and nervous system after a stroke.  Treatment for sensory loss may include physical, occupational, or speech therapy, and the use of assistive devices.  You may need to make changes to your home and lifestyle after a stroke to help you live safely and independently. This information is not intended to replace advice given to you by your health care provider. Make sure you discuss any questions you have with your health care provider. Document Revised:  02/10/2019 Document Reviewed: 02/05/2017 Elsevier Patient Education  [place].
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
## 2020-10-10 ENCOUNTER — Encounter: Payer: Self-pay | Admitting: Adult Health

## 2020-10-10 NOTE — Progress Notes (Signed)
I agree with the above plan 

## 2020-11-07 ENCOUNTER — Telehealth: Payer: Self-pay | Admitting: Adult Health

## 2020-11-07 MED ORDER — GABAPENTIN 300 MG PO CAPS: 300.0000 mg | ORAL_CAPSULE | Freq: Three times a day (TID) | ORAL | 3 refills | Status: DC

## 2020-11-07 NOTE — Telephone Encounter (Signed)
I called pt and relayed that gabapentin called in and 300mg  po tid.  He verbalized understanding.  Will give it a week.

## 2020-11-07 NOTE — Telephone Encounter (Signed)
Pt called, have ran out of gabapentin (NEURONTIN) 300 MG capsule. Current dosage is not working and I am in pain. Can you increase the dosage or prescribe something else. Would like a call from the nurse.

## 2020-11-07 NOTE — Telephone Encounter (Signed)
Called pt and he has not had any benefit from the gabapentin 300mg  po bid for R side pain.  No SE, no benefit.  Please advise. [facility] [place].

## 2020-11-07 NOTE — Telephone Encounter (Signed)
Recommend trialing increased dosage of gabapentin at 300 mg 3 times daily.  Recommend continuing for at least 1 week and to call office or send message via MyChart if continued no benefit or difficulty tolerating.  Updated dosage sent to [facility] in [place], [place].

## 2021-01-06 ENCOUNTER — Telehealth: Payer: Self-pay | Admitting: Adult Health

## 2021-01-06 MED ORDER — DULOXETINE HCL 30 MG PO CPEP: 30.0000 mg | ORAL_CAPSULE | Freq: Every day | ORAL | 3 refills | Status: DC

## 2021-01-06 NOTE — Telephone Encounter (Signed)
We can try to further increase dosage if he wishes or trial a different medication such as duloxetine.  As the symptoms have been persistent since his stroke, he may also benefit from doing outpatient therapy to help with sensory retraining.

## 2021-01-06 NOTE — Telephone Encounter (Signed)
Called and he does not feel that the gabapentin has really helped and would like to switch to something else.  Duloxetine.  Will discuss doing outpt therapy at appt.  Please call in.

## 2021-01-06 NOTE — Telephone Encounter (Signed)
I called pt and he stated that gabapentin 300mg  po tid has not helped at all.  He has appt 01-13-21, do you want to increase this again or try something else.

## 2021-01-06 NOTE — Telephone Encounter (Signed)
Pt called stating that the gabapentin (NEURONTIN) 300 MG capsule is not working for him and he is still having pain. Pt would like to know if he can try something else and be given enough to last him to his next appt next week. Please advise.

## 2021-01-13 ENCOUNTER — Ambulatory Visit (INDEPENDENT_AMBULATORY_CARE_PROVIDER_SITE_OTHER): Payer: 59 | Admitting: Adult Health

## 2021-01-13 ENCOUNTER — Encounter: Payer: Self-pay | Admitting: Adult Health

## 2021-01-13 VITALS — BP 150/95 | HR 92 | Ht 72.0 in | Wt 244.0 lb

## 2021-01-13 DIAGNOSIS — R202 Paresthesia of skin: Secondary | ICD-10-CM | POA: Diagnosis not present

## 2021-01-13 DIAGNOSIS — F1010 Alcohol abuse, uncomplicated: Secondary | ICD-10-CM

## 2021-01-13 DIAGNOSIS — G459 Transient cerebral ischemic attack, unspecified: Secondary | ICD-10-CM | POA: Diagnosis not present

## 2021-01-13 MED ORDER — DULOXETINE HCL 30 MG PO CPEP: 30.0000 mg | ORAL_CAPSULE | Freq: Every day | ORAL | 3 refills | Status: DC

## 2021-01-13 MED ORDER — GABAPENTIN 300 MG PO CAPS: 300.0000 mg | ORAL_CAPSULE | Freq: Three times a day (TID) | ORAL | 5 refills | Status: DC

## 2021-01-13 NOTE — Patient Instructions (Addendum)
Obtain lab work to look for underlying causes of continued nerve pain  Also recommend EMG/NCV for continued right-sided symptoms and new left hand symptoms  Restart gabapentin 300 mg daily and start duloxetine 30 mg daily for nerve pain  Continue aspirin 81 mg daily  and atorvastatin 40 mg daily for secondary stroke prevention Stop Plavix at this time  Continue to follow up with PCP regarding cholesterol and blood pressure management  Maintain strict control of hypertension with blood pressure goal below 130/90, diabetes with hemoglobin A1c goal below 6.5% and cholesterol with LDL cholesterol (bad cholesterol) goal below 70 mg/dL.       Followup in the future with me in 4 months or call earlier if needed       Thank you for coming to see [place] at [facility]. I hope we have been able to provide you high quality care today.  You may receive a patient satisfaction survey over the next few weeks. We would appreciate your feedback and comments so that we may continue to improve ourselves and the health of our patients.     Paresthesia Paresthesia is an abnormal burning or prickling sensation. It is usually felt in the hands, arms, legs, or feet. However, it may occur in any part of the body. Usually, paresthesia is not painful. It may feel like:  Tingling or numbness.  Buzzing.  Itching. Paresthesia may occur without any clear cause, or it may be caused by:  Breathing too quickly (hyperventilation).  Pressure on a nerve.  An underlying medical condition.  Side effects of a medication.  Nutritional deficiencies.  Exposure to toxic chemicals. Most people experience temporary (transient) paresthesia at some time in their lives. For some people, it may be long-lasting (chronic) because of an underlying medical condition. If you have paresthesia that lasts a long time, you need to be evaluated by your health care provider. Follow these instructions at  home: Alcohol use  Do not drink alcohol if: ? Your health care provider tells you not to drink. ? You are pregnant, may be pregnant, or are planning to become pregnant.  If you drink alcohol: ? Limit how much you use to:  0-1 drink a day for women.  0-2 drinks a day for men. ? Be aware of how much alcohol is in your drink. In the [place] one drink equals one 12 oz bottle of beer (355 mL), one 5 oz glass of wine (148 mL), or one 1 oz glass of hard liquor (44 mL).   Nutrition  Eat a healthy diet. This includes: ? Eating foods that are high in fiber, such as fresh fruits and vegetables, whole grains, and beans. ? Limiting foods that are high in fat and processed sugars, such as fried or sweet foods.   General instructions  Take over-the-counter and prescription medicines only as told by your health care provider.  Do not use any products that contain nicotine or tobacco, such as cigarettes and e-cigarettes. These can keep blood from reaching damaged nerves. If you need help quitting, ask your health care provider.  If you have diabetes, work closely with your health care provider to keep your blood sugar under control.  If you have numbness in your feet: ? Check every day for signs of injury or infection. Watch for redness, warmth, and swelling. ? Wear padded socks and comfortable shoes. These help protect your feet.  Keep all follow-up visits as told by your health care provider. This is important. Contact  a health care provider if you:  Have paresthesia that gets worse or does not go away.  Have numbness after an injury.  Have a burning or prickling feeling that gets worse when you walk.  Have pain, cramps, or dizziness, or you faint.  Develop a rash. Get help right away if you:  Feel muscle weakness.  Develop new weakness in an arm or leg.  Have trouble walking or moving.  Have problems with speech, understanding, or vision.  Feel confused.  Cannot control your  bladder or bowel movements. Summary  Paresthesia is an abnormal burning or prickling sensation that is usually felt in the hands, arms, legs, or feet. It may also occur in other parts of the body.  Paresthesia may occur without any clear cause, or it may be caused by breathing too quickly (hyperventilation), pressure on a nerve, an underlying medical condition, side effects of a medication, nutritional deficiencies, or exposure to toxic chemicals.  If you have paresthesia that lasts a long time, you need to be evaluated by your health care provider. This information is not intended to replace advice given to you by your health care provider. Make sure you discuss any questions you have with your health care provider. Document Revised: 07/30/2020 Document Reviewed: 07/30/2020 Elsevier Patient Education  2021 [place] is a test to check how well your muscles and nerves are working. This procedure includes the combined use of electromyogram (EMG) and nerve conduction study (NCS). EMG is used to look for muscular disorders. NCS, which is also called electroneurogram, measures how well your nerves are controlling your muscles. The procedures are usually done together to check if your muscles and nerves are healthy. If the results of the tests are abnormal, this may indicate disease or injury, such as a neuromuscular disease or peripheral nerve damage. Tell a health care provider about:  Any allergies you have.  All medicines you are taking, including vitamins, herbs, eye drops, creams, and over-the-counter medicines.  Any problems you or family members have had with anesthetic medicines.  Any blood disorders you have.  Any surgeries you have had.  Any medical conditions you have.  If you have a pacemaker.  Whether you are pregnant or may be pregnant. What are the risks? Generally, this is a safe procedure. However, problems may occur,  including:  Infection where the electrodes were inserted.  Bleeding. What happens before the procedure? Medicines Ask your health care provider about:  Changing or stopping your regular medicines. This is especially important if you are taking diabetes medicines or blood thinners.  Taking medicines such as aspirin and ibuprofen. These medicines can thin your blood. Do not take these medicines unless your health care provider tells you to take them.  Taking over-the-counter medicines, vitamins, herbs, and supplements. General instructions  Your health care provider may ask you to avoid: ? Beverages that have caffeine, such as coffee and tea. ? Any products that contain nicotine or tobacco. These products include cigarettes, e-cigarettes, and chewing tobacco. If you need help quitting, ask your health care provider.  Do not use lotions or creams on the same day that you will be having the procedure. What happens during the procedure? For EMG  Your health care provider will ask you to stay in a position so that he or she can access the muscle that will be studied. You may be standing, sitting, or lying down.  You may be given a medicine that numbs the area (local  anesthetic).  A very thin needle that has an electrode will be inserted into your muscle.  Another small electrode will be placed on your skin near the muscle.  Your health care provider will ask you to continue to remain still.  The electrodes will send a signal that tells about the electrical activity of your muscles. You may see this on a monitor or hear it in the room.  After your muscles have been studied at rest, your health care provider will ask you to contract or flex your muscles. The electrodes will send a signal that tells about the electrical activity of your muscles.  Your health care provider will remove the electrodes and the electrode needles when the procedure is finished. The procedure may vary among  health care providers and hospitals.   For NCS  An electrode that records your nerve activity (recording electrode) will be placed on your skin by the muscle that is being studied.  An electrode that is used as a reference (reference electrode) will be placed near the recording electrode.  A paste or gel will be applied to your skin between the recording electrode and the reference electrode.  Your nerve will be stimulated with a mild shock. Your health care provider will measure how much time it takes for your muscle to react.  Your health care provider will remove the electrodes and the gel when the procedure is finished. The procedure may vary among health care providers and hospitals.   What happens after the procedure?  It is up to you to get the results of your procedure. Ask your health care provider, or the department that is doing the procedure, when your results will be ready.  Your health care provider may: ? Give you medicines for any pain. ? Monitor the insertion sites to make sure that bleeding stops. Summary  Electromyoneurogram is a test to check how well your muscles and nerves are working.  If the results of the tests are abnormal, this may indicate disease or injury.  This is a safe procedure. However, problems may occur, such as bleeding and infection.  Your health care provider will do two tests to complete this procedure. One checks your muscles (EMG) and another checks your nerves (NCS).  It is up to you to get the results of your procedure. Ask your health care provider, or the department that is doing the procedure, when your results will be ready. This information is not intended to replace advice given to you by your health care provider. Make sure you discuss any questions you have with your health care provider. Document Revised: 07/05/2018 Document Reviewed: 06/17/2018 Elsevier Patient Education  [place].
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
## 2021-01-14 ENCOUNTER — Encounter: Payer: Self-pay | Admitting: Adult Health

## 2021-01-14 LAB — HOMOCYSTEINE: Homocysteine: 11.1 umol/L (ref 0.0–14.5)

## 2021-01-14 LAB — TSH: TSH: 3.31 u[IU]/mL (ref 0.450–4.500)

## 2021-01-14 LAB — VITAMIN B12: Vitamin B-12: 531 pg/mL (ref 232–1245)

## 2021-01-19 NOTE — Progress Notes (Signed)
I agree with the above plan 

## 2021-02-06 ENCOUNTER — Telehealth: Payer: Self-pay | Admitting: Neurology

## 2021-02-06 ENCOUNTER — Encounter: Payer: 59 | Admitting: Neurology

## 2021-02-06 NOTE — Telephone Encounter (Signed)
Patient no showed EMG/NCS today, this is 2 appointments left unfilled now. Please do not reschedule him for this test until patient is spoken to by Janett Billow at next appointment.

## 2021-02-06 NOTE — Telephone Encounter (Signed)
He showed up late today. I am happy to have him back on my schedule but would appreciate a call to him from [place]. It's unfortunate when we have 2 appointments go unfilled, we can reschedule him for emg/ncs(any physician) but prefer if nurse or Janett Billow call and have a conversation with him to ensure he makes on time and that he will come. thanks

## 2021-02-06 NOTE — Telephone Encounter (Signed)
Looks like he cancelled via automated reminder system - is there a way to verify this prior to considering as a no show? Will forward to Angie for further assistance

## 2021-02-06 NOTE — Telephone Encounter (Signed)
Jillian called and rescheduled patient.  Lossie Faes saw that the ncs part of the appointment was cancelled by the automated reminder call, so she called the patient to verify if this was a mistake or not. He said it was a mistake and he wanted to keep the appointment, so she added him back onto the schedule and confirmed check in time. Just wanted to let you know in response to your note about this.

## 2021-02-06 NOTE — Telephone Encounter (Signed)
I called pt he stated got here about 5 minutes after 1000.  I relayed that he is to arrive 30 min prior to appt for check in. I said that if he is late next time he will not be able to be seen here.  He verbalized understanding.

## 2021-02-06 NOTE — Telephone Encounter (Signed)
Larry Nichols, can you please contact patient to discuss missing todays visit and reschedule - if misses again, he will be discharged from clinic. Thank you

## 2021-02-18 ENCOUNTER — Ambulatory Visit (INDEPENDENT_AMBULATORY_CARE_PROVIDER_SITE_OTHER): Payer: 59 | Admitting: Neurology

## 2021-02-18 ENCOUNTER — Encounter (INDEPENDENT_AMBULATORY_CARE_PROVIDER_SITE_OTHER): Payer: 59 | Admitting: Neurology

## 2021-02-18 ENCOUNTER — Encounter: Payer: Self-pay | Admitting: Neurology

## 2021-02-18 DIAGNOSIS — F1010 Alcohol abuse, uncomplicated: Secondary | ICD-10-CM

## 2021-02-18 DIAGNOSIS — R202 Paresthesia of skin: Secondary | ICD-10-CM

## 2021-02-18 DIAGNOSIS — Z0289 Encounter for other administrative examinations: Secondary | ICD-10-CM

## 2021-02-18 NOTE — Progress Notes (Signed)
Full Name: Larry Nichols Gender: Male MRN #: [id number] Date of Birth: 10/10/1961    Visit Date: 02/18/2021 09:53 Age: 60 Years Examining Physician: Arlice Colt, MD  Referring Physician: Frann Rider, NP    History: He is a 60 year old man who reports numbness tingling and pain in both hands.  He notes difficulty with his grip, left worse than right.  On exam, strength was normal.  He had Tinel signs at both wrists.  Nerve conduction studies: The right median, ulnar, peroneal and tibial motor responses in the left median and ulnar motor responses had normal distal latencies, amplitudes and conduction velocities.  Tibial, ulnar and peroneal F-wave latencies were normal or near normal.  Bilateral median sensory responses had delayed peak latencies and normal amplitudes.  Bilateral ulnar sensory response is in the right sural and superficial peroneal sensory responses were normal.  Electromyography: Needle EMG of selected muscles of the right arm and left hand was performed.  Motor unit morphology and recruitment was normal in follow-up muscles tested.   Impression: This NCV/EMG study shows the following: 1.   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 2.   No evidence of polyneuropathy or other mononeuropathies or radiculopathy.  Aunya Lemler A. Felecia Shelling, MD, PhD, FAAN Certified in Neurology, Clinical Neurophysiology, Sleep Medicine, Pain Medicine and Neuroimaging Director, [facility] [address], [place], [place] [address] [phone]   Verbal informed consent was obtained from the patient, patient was informed of potential risk of procedure, including bruising, bleeding, hematoma formation, infection, muscle weakness, muscle pain, numbness, among others.        [place]    Nerve / Sites Muscle Latency Ref. Amplitude Ref. Rel Amp Segments Distance Velocity  Ref. Area    ms ms mV mV %  cm m/s m/s mVms  R Median - APB     Wrist APB 4.0 ?4.4 7.3 ?4.0 100 Wrist - APB 7   29.6     Upper arm APB 9.1  6.9  94.5 Upper arm - Wrist 27 52 ?49 28.9  L Median - APB     Wrist APB 3.9 ?4.4 4.7 ?4.0 100 Wrist - APB 7   18.4     Upper arm APB 8.5  4.2  88.5 Upper arm - Wrist 26 56 ?49 17.0  R Ulnar - ADM     Wrist ADM 2.7 ?3.3 10.0 ?6.0 100 Wrist - ADM 7   35.4     B.Elbow ADM 7.3  9.6  95.9 B.Elbow - Wrist 24 53 ?49 34.1     A.Elbow ADM 9.1  9.6  100 A.Elbow - B.Elbow 10 53 ?49 33.9  L Ulnar - ADM     Wrist ADM 3.0 ?3.3 10.0 ?6.0 100 Wrist - ADM 7   32.7     B.Elbow ADM 7.0  9.3  92.4 B.Elbow - Wrist 22 55 ?49 33.6     A.Elbow ADM 9.1  9.0  96.8 A.Elbow - B.Elbow 10 49 ?49 31.6  R Peroneal - EDB     Ankle EDB 7.2 ?6.5 1.4 ?2.0 100 Ankle - EDB 9   8.0     Fib head EDB 14.7  1.2  85.7 Fib head - Ankle 33 44 ?44 6.2     Pop fossa EDB 17.0  1.3  109 Pop fossa - Fib head 10 44 ?44 6.8  Pop fossa - Ankle      R Tibial - AH     Ankle AH 6.4 ?5.8 10.1 ?4.0 100 Ankle - AH 9   25.9     Pop fossa AH 16.3  9.2  91.5 Pop fossa - Ankle 42 42 ?41 27.6                      SNC    Nerve / Sites Rec. Site Peak Lat Ref.  Amp Ref. Segments Distance Peak Diff Ref.    ms ms V V  cm ms ms  R Sural - Ankle (Calf)     Calf Ankle 4.4 ?4.4 8 ?6 Calf - Ankle 14    R Superficial peroneal - Ankle     Lat leg Ankle 4.1 ?4.4 8 ?6 Lat leg - Ankle 14    R Median, Ulnar - Transcarpal comparison     Median Palm Wrist 2.8 ?2.2 40 ?35 Median Palm - Wrist 8       Ulnar Palm Wrist 2.0 ?2.2 13 ?12 Ulnar Palm - Wrist 8          Median Palm - Ulnar Palm  0.9 ?0.4  R Median - Orthodromic (Dig II, Mid palm)     Dig II Wrist 3.6 ?3.4 11 ?10 Dig II - Wrist 13    L Median - Orthodromic (Dig II, Mid palm)     Dig II Wrist 3.6 ?3.4 14 ?10 Dig II - Wrist 13    R Ulnar - Orthodromic, (Dig V, Mid palm)     Dig V Wrist 2.7 ?3.1 5 ?5 Dig V - Wrist 11    L Ulnar - Orthodromic, (Dig  V, Mid palm)     Dig V Wrist 3.1 ?3.1 5 ?5 Dig V - Wrist 11                     F  Wave    Nerve F Lat Ref.   ms ms  R Tibial - AH 60.3 ?56.0  R Ulnar - ADM 31.2 ?32.0  L Ulnar - ADM 32.2 ?32.0           EMG Summary Table    Spontaneous MUAP Recruitment  Muscle IA Fib PSW Fasc Other Amp Dur. Poly Pattern  R. Deltoid Normal None None None _______ Normal Normal Normal Normal  R. Triceps brachii Normal None None None _______ Normal Normal 1+ Normal  R. Biceps brachii Normal None None None _______ Normal Normal Normal Normal  R. Extensor digitorum communis Normal None None None _______ Normal Normal Normal Normal  R. First dorsal interosseous Normal None None None _______ Normal Normal Normal Normal  R. Abductor pollicis brevis Normal None None None _______ Normal Normal Normal Normal  L. First dorsal interosseous Normal None None None _______ Normal Normal Normal Normal  L. Abductor pollicis brevis Normal None None None _______ Normal Normal Normal Normal

## 2021-04-30 ENCOUNTER — Telehealth: Payer: Self-pay | Admitting: Adult Health

## 2021-04-30 MED ORDER — DULOXETINE HCL 60 MG PO CPEP: 60.0000 mg | ORAL_CAPSULE | Freq: Every day | ORAL | 1 refills | Status: AC

## 2021-04-30 NOTE — Telephone Encounter (Signed)
Pt requesting a call back, wants to know if there is any way he can have a prescription for the pain he is having until his appt in July.

## 2021-04-30 NOTE — Telephone Encounter (Signed)
I called pt and relayed plan he was agreeable to duloxetine 60mg  po daily.  He will call in refill for gabapentin. He will pick both up today.  He appreciated call back and will keep f/u appt and discuss how things are when he is in for appt.

## 2021-04-30 NOTE — Telephone Encounter (Signed)
I called pt.  He is having finger, hand pain (this worsening since last seen), he noted the gabapentin 300mg  po tid and duloxetine 30mg  po daily has not really helped.  CTS mild EMG/NCS. Has appt 05-15-21 1115 (arrive 1045).  Would you recommend increasing doses on these or something else to relieve pain until he is seen?

## 2021-04-30 NOTE — Telephone Encounter (Signed)
If he is agreeable, I would recommend that we increase duloxetine dosage to 60 mg daily.  If he is agreeable, please place order.  Thank you.

## 2021-05-15 ENCOUNTER — Encounter: Payer: Self-pay | Admitting: Adult Health

## 2021-05-15 ENCOUNTER — Other Ambulatory Visit: Payer: Self-pay

## 2021-05-15 ENCOUNTER — Ambulatory Visit (INDEPENDENT_AMBULATORY_CARE_PROVIDER_SITE_OTHER): Payer: 59 | Admitting: Adult Health

## 2021-05-15 VITALS — BP 126/81 | HR 80 | Ht 72.0 in | Wt 239.8 lb

## 2021-05-15 DIAGNOSIS — M5416 Radiculopathy, lumbar region: Secondary | ICD-10-CM

## 2021-05-15 DIAGNOSIS — M5412 Radiculopathy, cervical region: Secondary | ICD-10-CM

## 2021-05-15 DIAGNOSIS — G459 Transient cerebral ischemic attack, unspecified: Secondary | ICD-10-CM

## 2021-05-15 DIAGNOSIS — R202 Paresthesia of skin: Secondary | ICD-10-CM

## 2021-05-15 MED ORDER — GABAPENTIN 300 MG PO CAPS: 600.0000 mg | ORAL_CAPSULE | Freq: Three times a day (TID) | ORAL | 5 refills | Status: AC
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
## 2021-05-15 NOTE — Progress Notes (Signed)
I agree with the above plan 

## 2021-05-15 NOTE — Patient Instructions (Addendum)
Your Plan:  Increase gabapentin to 600mg  3 times daily - ensure you slowly increase dosage to help with tolerance and avoiding potential side effects - please let me know if you have any difficulty tolerating or if no benefit after 1 to 2 weeks  Continue duloxetine 60 mg daily  You will be called to schedule neck and lower back imaging for further evaluation    Follow-up in 4 months or call earlier if needed     Thank you for coming to see [place] at [facility]. I hope we have been able to provide you high quality care today.  You may receive a patient satisfaction survey over the next few weeks. We would appreciate your feedback and comments so that we may continue to improve ourselves and the health of our patients.

## 2021-06-11 ENCOUNTER — Ambulatory Visit (HOSPITAL_COMMUNITY)
Admission: RE | Admit: 2021-06-11 | Discharge: 2021-06-11 | Disposition: A | Discharge status: Home / Self Care | Payer: 59 | Source: Ambulatory Visit | Attending: Adult Health | Admitting: Adult Health

## 2021-06-11 ENCOUNTER — Other Ambulatory Visit: Payer: Self-pay

## 2021-06-11 DIAGNOSIS — R202 Paresthesia of skin: Secondary | ICD-10-CM

## 2021-06-11 DIAGNOSIS — M5412 Radiculopathy, cervical region: Secondary | ICD-10-CM | POA: Insufficient documentation

## 2021-06-11 DIAGNOSIS — M5416 Radiculopathy, lumbar region: Secondary | ICD-10-CM

## 2021-06-11 IMAGING — MR MR LUMBAR SPINE W/O CM
5 series · 31 of 48 positions shown · non-contrast
Comparison: None.

CLINICAL DATA: Low back pain with progressive neurologic deficit

EXAM:
MRI LUMBAR SPINE WITHOUT CONTRAST
TECHNIQUE: Multiplanar, multisequence MR imaging of the lumbar spine was
performed. No intravenous contrast was administered.

[Series 10: T2 · sagittal · 4.0mm · 0.71mm/px · 6 of 17 slices shown (1 of 2)]
[im 1/17]
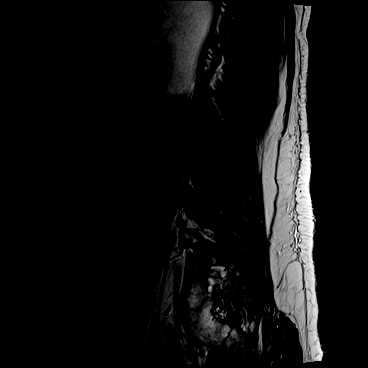
[im 4/17]
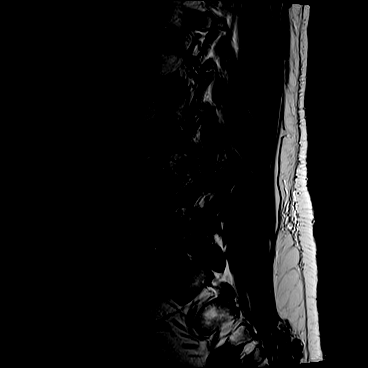
[im 7/17]
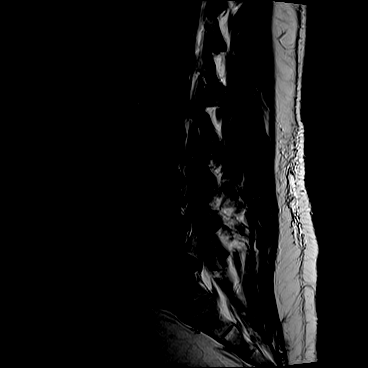
[im 10/17]
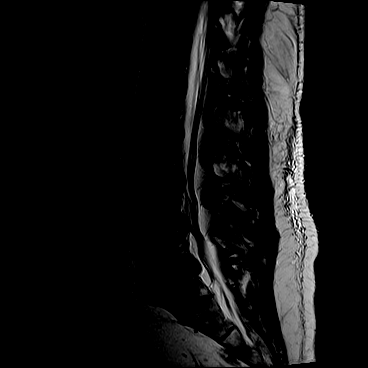
[im 13/17]
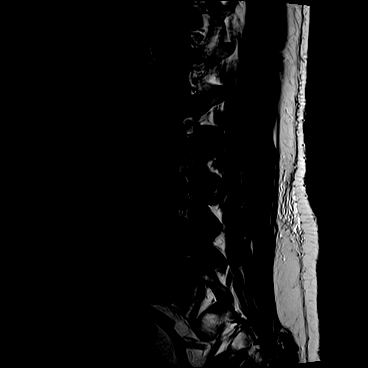
[im 17/17]
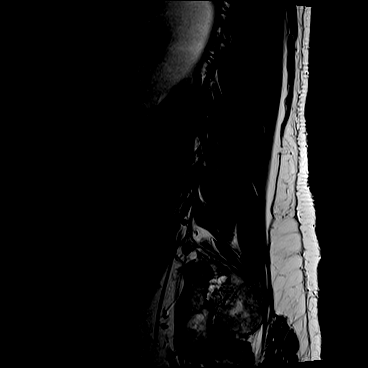

[Series 11: T1 · sagittal · 4.0mm · 0.81mm/px · 7 of 17 slices shown (1 of 2)]
[im 1/17]
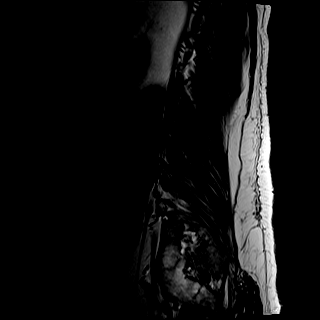
[im 3/17]
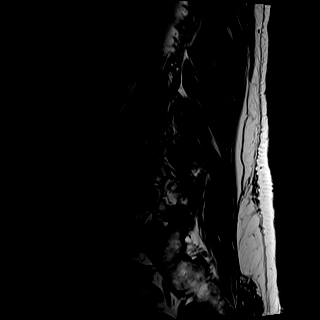
[im 6/17]
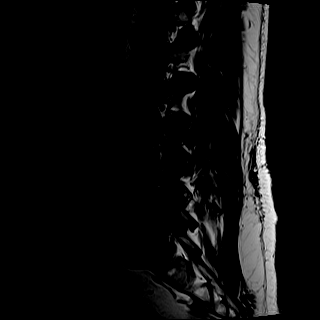
[im 9/17]
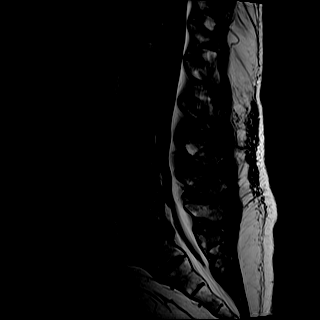
[im 11/17]
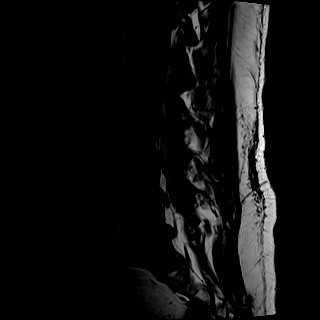
[im 14/17]
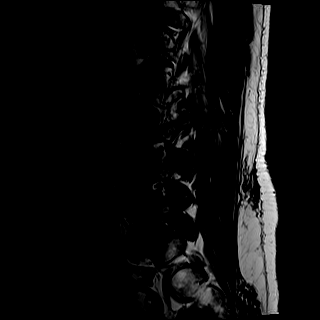
[im 17/17]
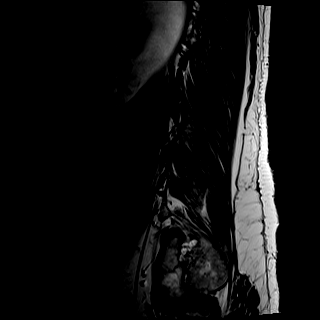

[Series 12: STIR · sagittal · 4.0mm · 0.51mm/px · 2 of 17 slices shown]
[im 1/17]
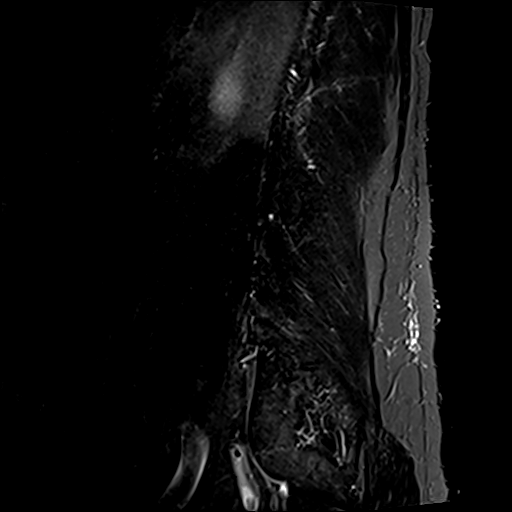
[im 3/17]
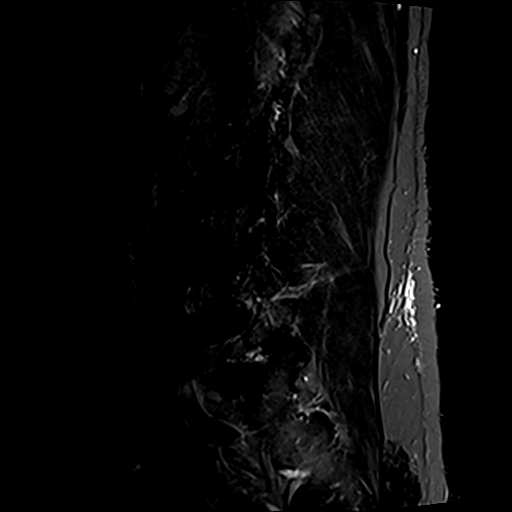

[Series 13: T2 · axial · 4.0mm · 0.70mm/px · z∈[-531,-327]mm · 8 of 35 slices shown (2 of 2)]
[im 1/35]
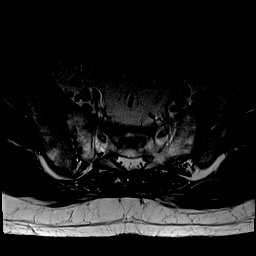
[im 6/35]
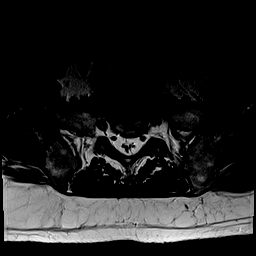
[im 11/35]
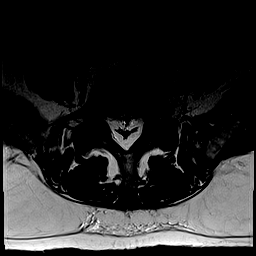
[im 16/35]
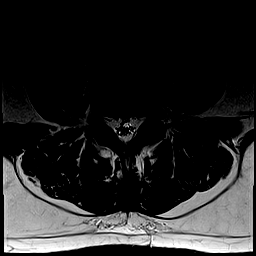
[im 19/35]
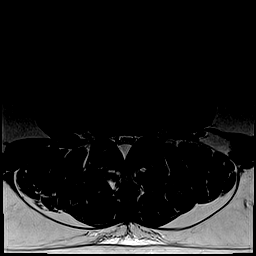
[im 24/35]
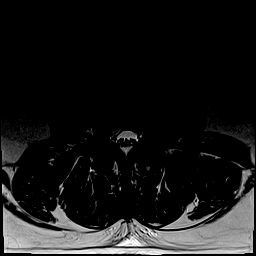
[im 29/35]
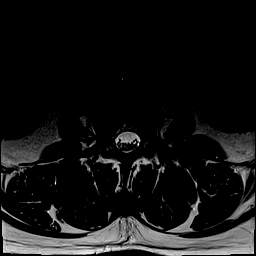
[im 35/35]
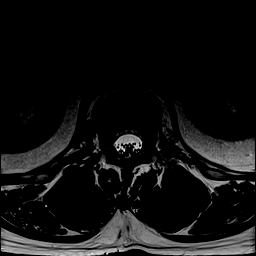

[Series 14: T1 · axial · 4.0mm · 0.35mm/px · z∈[-531,-327]mm · 8 of 35 slices shown (2 of 2)]
[im 1/35]
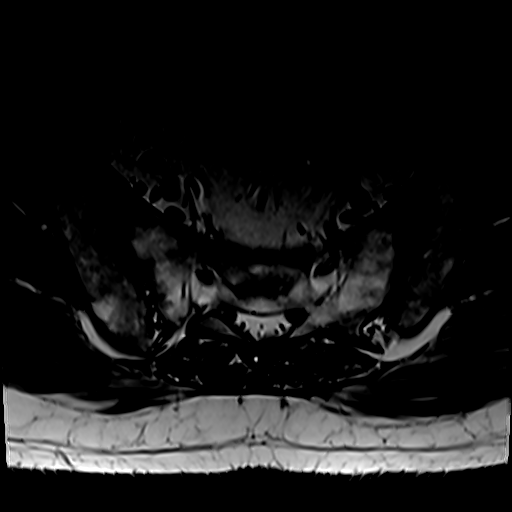
[im 6/35]
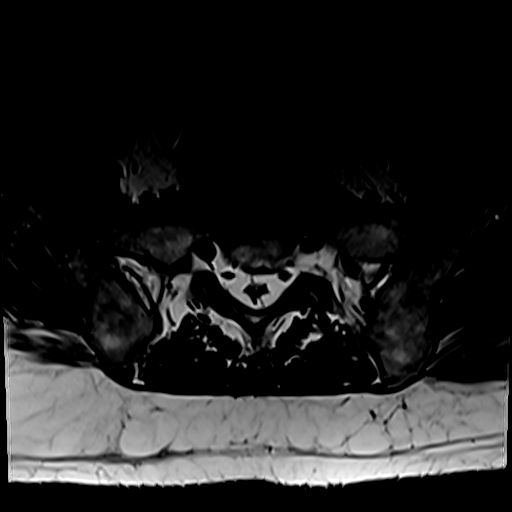
[im 11/35]
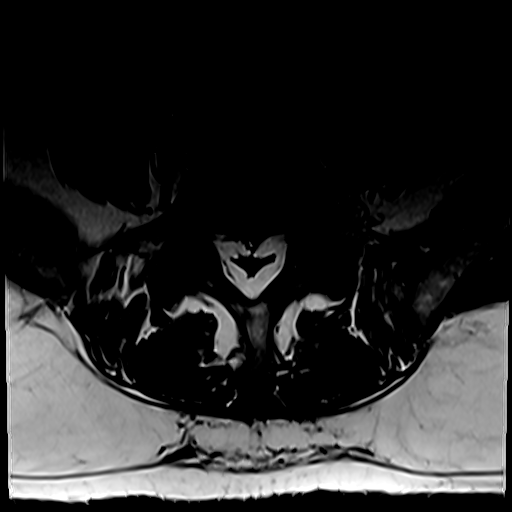
[im 16/35]
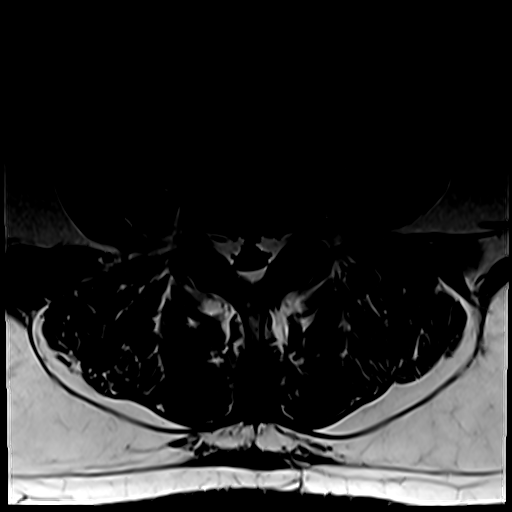
[im 19/35]
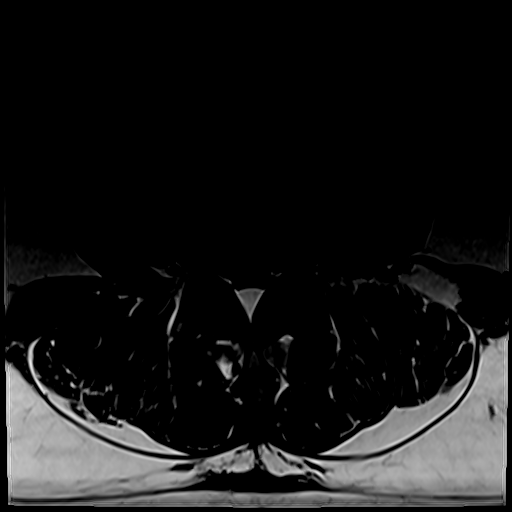
[im 24/35]
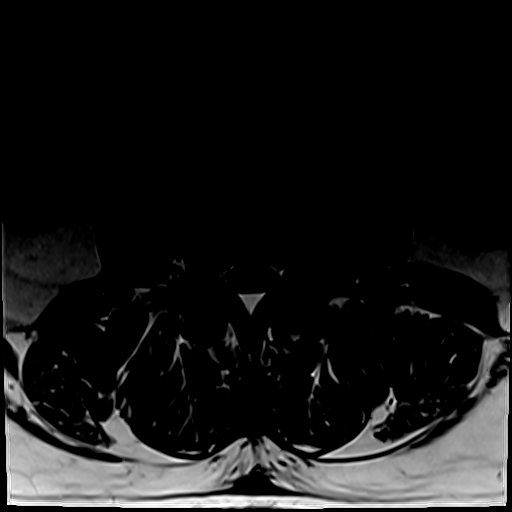
[im 29/35]
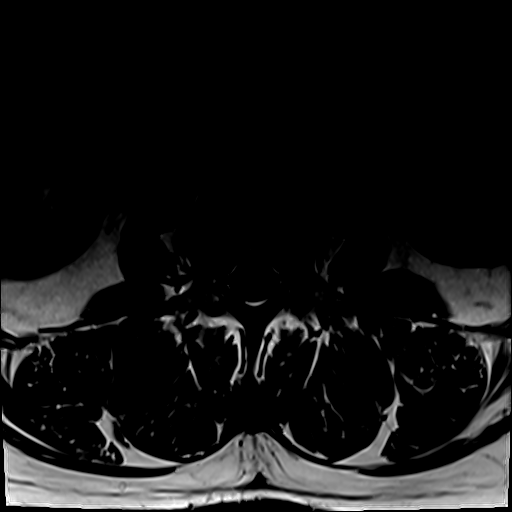
[im 35/35]
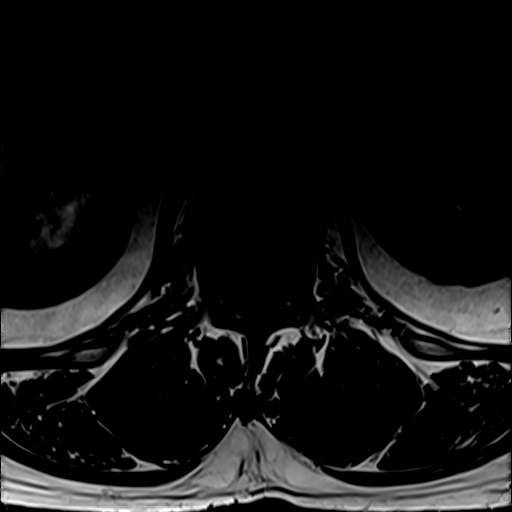

[31 of 48 positions shown; findings below may reference images not displayed]

FINDINGS: Segmentation:  5 lumbar type vertebrae

Alignment:  Physiologic

Vertebrae:  No fracture, evidence of discitis, or bone lesion.

Conus medullaris and cauda equina: Conus extends to the L1-2 level.
Conus and cauda equina appear normal.

Paraspinal and other soft tissues: No evidence of perispinal mass or
inflammation

Disc levels:

T12- L1: Unremarkable.

L1-L2: Unremarkable.

L2-L3: Foraminal predominant disc bulging.  Mild facet spurring

L3-L4: Disc narrowing and bulging with bilateral facet spurring.
Mild left more than right foraminal narrowing. Moderate thecal sac
effacement from epidural fat

L4-L5: Epidural fat effaces the thecal sac with exacerbation from
disc bulging. Biforaminal disc protrusion, larger on the left. Mild
facet spurring bilaterally. Moderate left foraminal stenosis

L5-S1:Epidural fat effaces the thecal sac. The disc is narrowed and
bulging, especially towards the left. Bilateral facet spurring. Left
more than right foraminal impingement.
IMPRESSION: 1. Epidural lipomatosis at L3-4 and below with thecal sac
effacement.
2. Disc bulging and facet spurring primarily at L3-4 and below.
Foraminal impingement is present on the left at L4-5 and bilaterally
at L5-S1.

## 2021-06-11 IMAGING — MR MR CERVICAL SPINE W/O CM
5 series · 40 of 48 positions shown · non-contrast
Comparison: None.

CLINICAL DATA: Chronic neck pain with degenerative changes on x-ray

EXAM:
MRI CERVICAL SPINE WITHOUT CONTRAST
TECHNIQUE: Multiplanar, multisequence MR imaging of the cervical spine was
performed. No intravenous contrast was administered.

[Series 5: T2 · sagittal · 3.0mm · 0.72mm/px · 6 of 15 slices shown (1 of 2)]
[im 1/15]
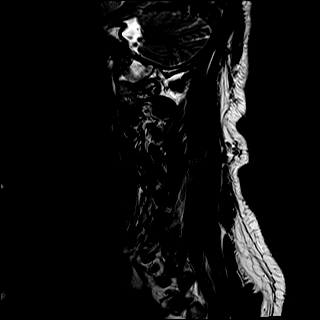
[im 3/15]
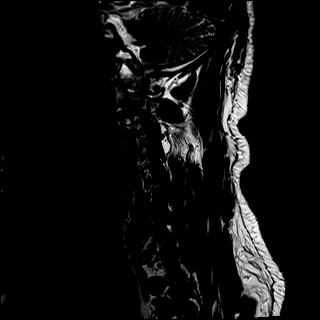
[im 6/15]
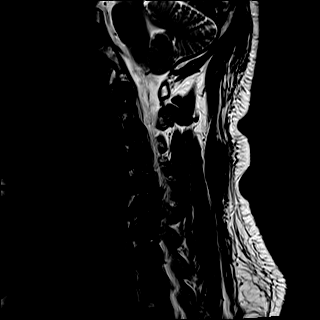
[im 9/15]
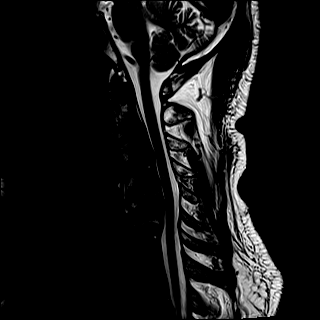
[im 12/15]
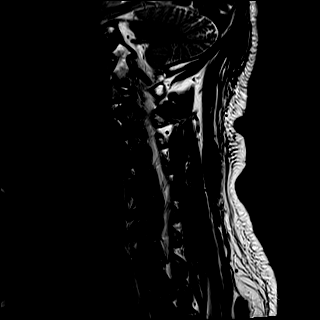
[im 15/15]
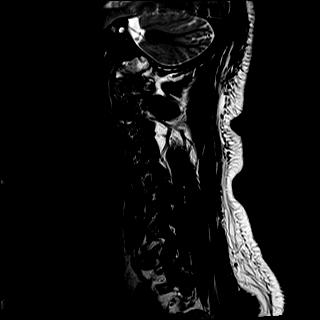

[Series 6: T1 · sagittal · 3.0mm · 0.90mm/px · 6 of 15 slices shown]
[im 1/15]
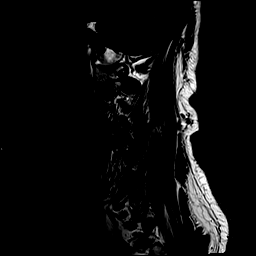
[im 3/15]
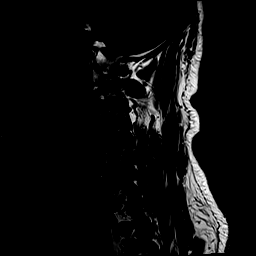
[im 6/15]
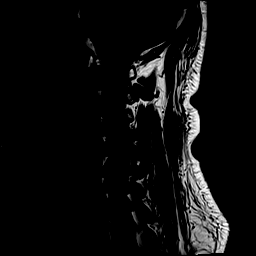
[im 9/15]
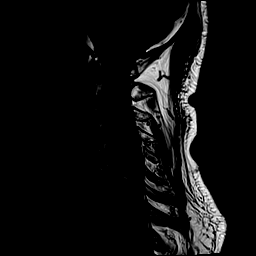
[im 12/15]
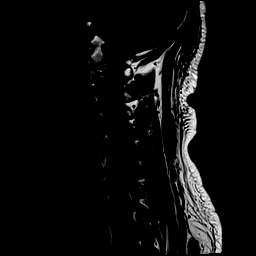
[im 15/15]
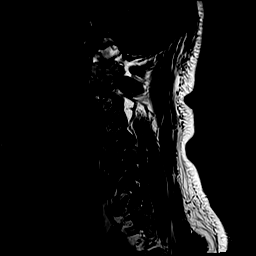

[Series 7: STIR · sagittal · 3.0mm · 0.72mm/px · 6 of 15 slices shown]
[im 1/15]
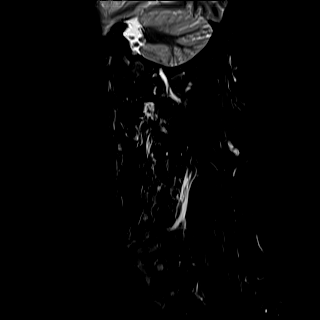
[im 3/15]
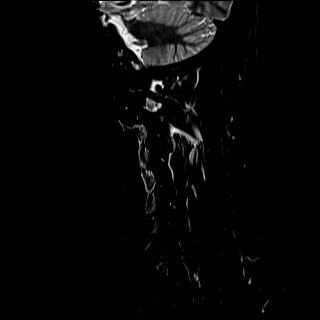
[im 6/15]
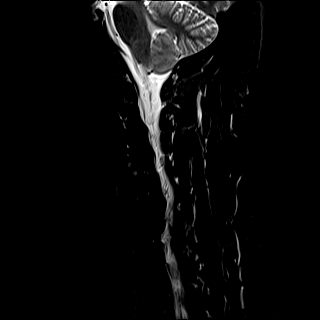
[im 9/15]
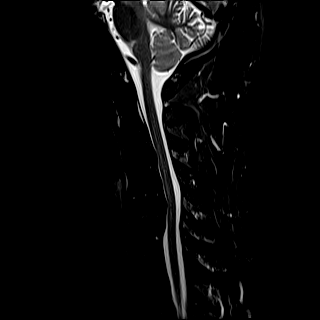
[im 12/15]
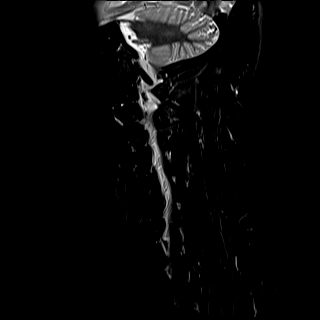
[im 15/15]
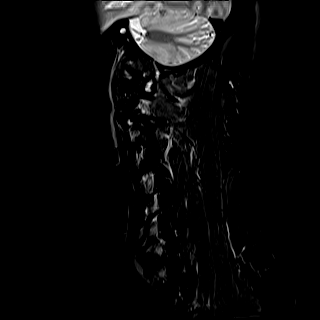

[Series 8: T2 · axial · 3.0mm · 0.70mm/px · z∈[-72,+52]mm · 14 of 40 slices shown (2 of 2)]
[im 1/40]
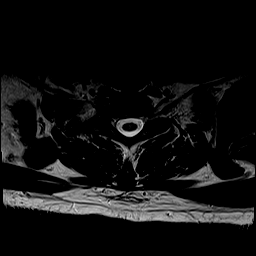
[im 3/40]
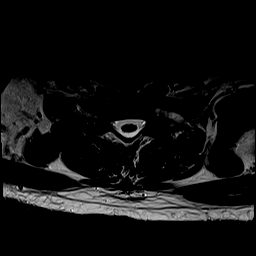
[im 6/40]
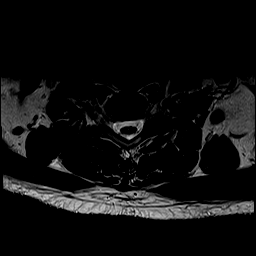
[im 9/40]
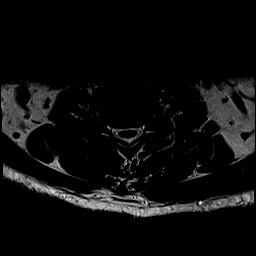
[im 12/40]
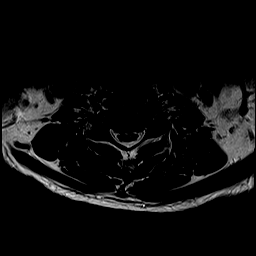
[im 14/40]
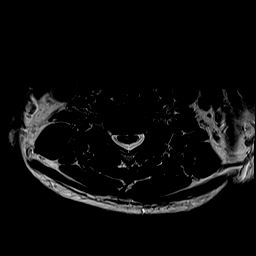
[im 17/40]
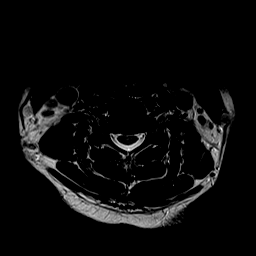
[im 20/40]
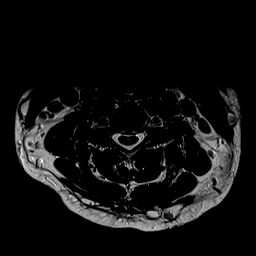
[im 23/40]
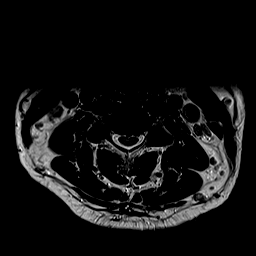
[im 26/40]
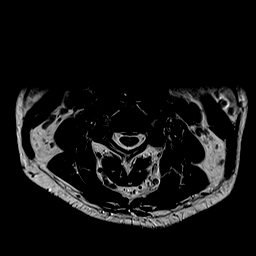
[im 28/40]
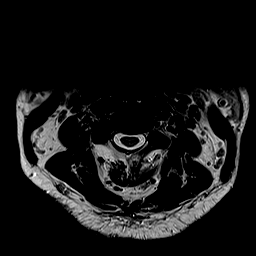
[im 31/40]
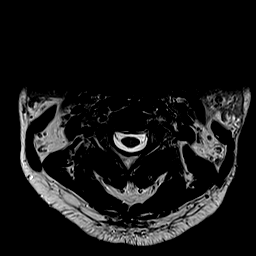
[im 34/40]
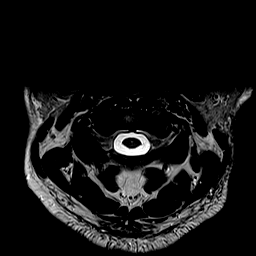
[im 40/40]
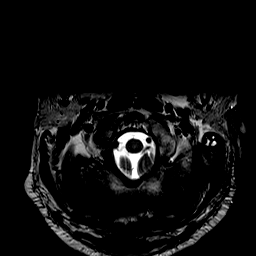

[Series 9: GRE · axial · 3.0mm · 0.35mm/px · z∈[-72,+52]mm · 8 of 40 slices shown]
[im 1/40]
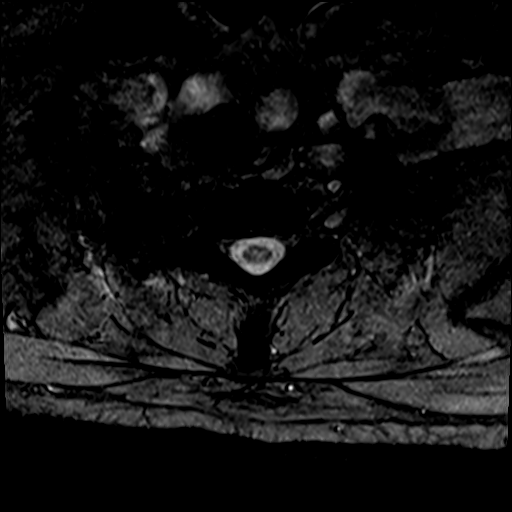
[im 6/40]
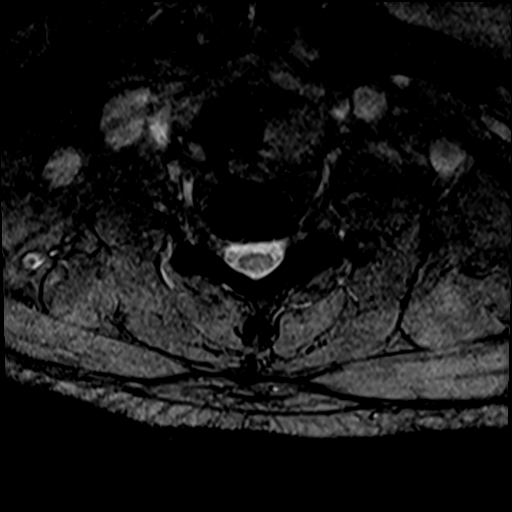
[im 12/40]
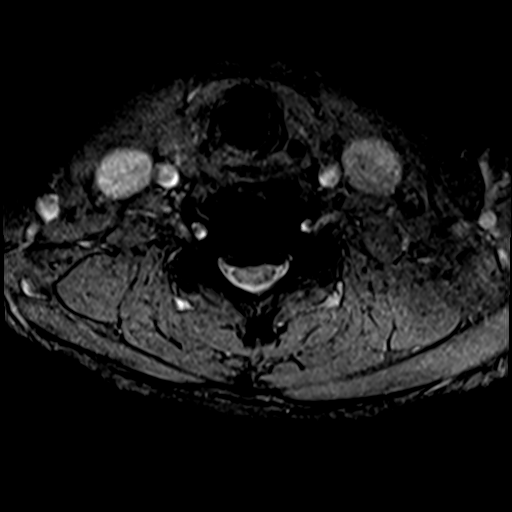
[im 17/40]
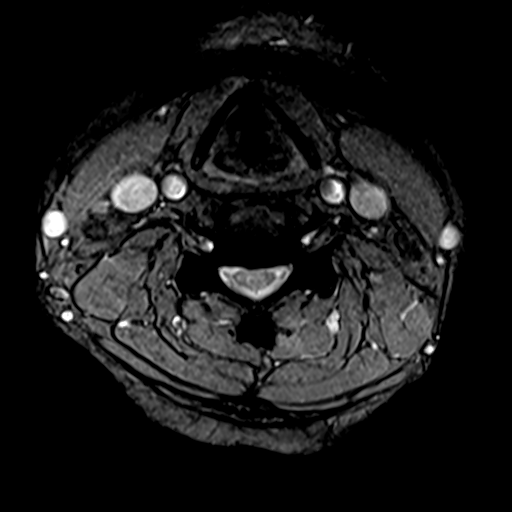
[im 23/40]
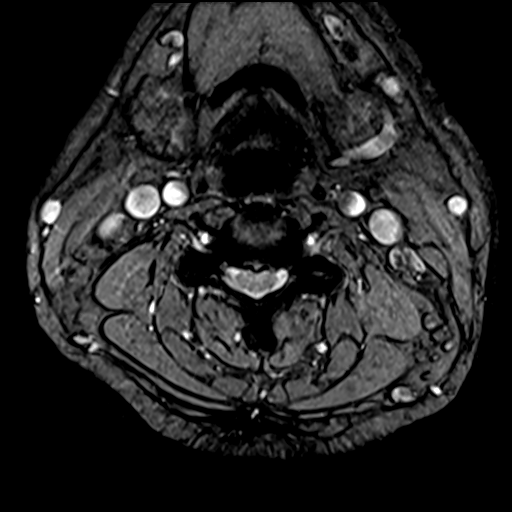
[im 28/40]
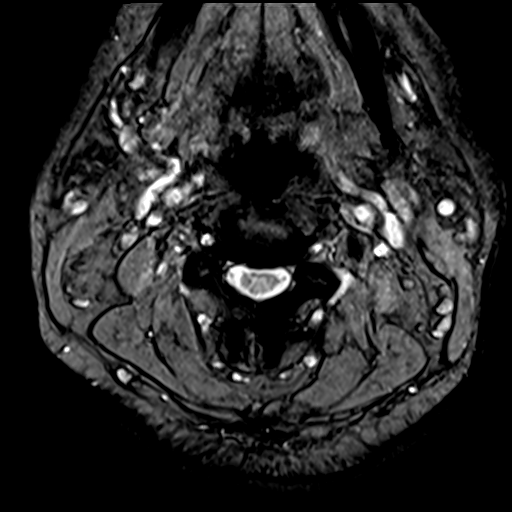
[im 34/40]
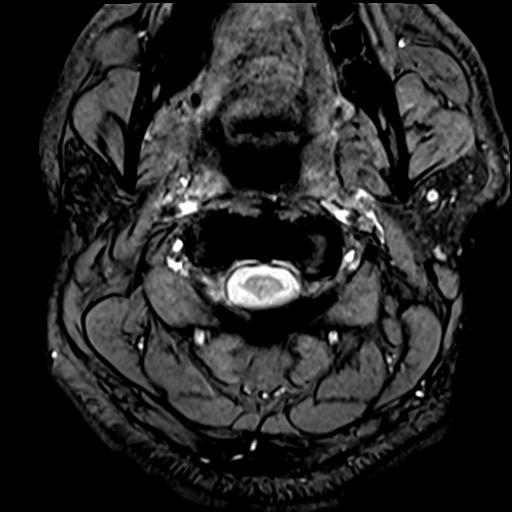
[im 40/40]
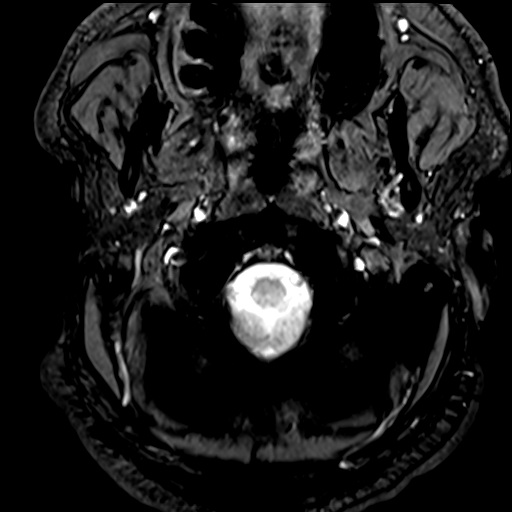

[40 of 48 positions shown; findings below may reference images not displayed]

FINDINGS: Alignment: Reversal of cervical lordosis with slight C4-5
anterolisthesis.

Vertebrae: No fracture, evidence of discitis, or bone lesion.

Cord: Normal signal and morphology.

Posterior Fossa, vertebral arteries, paraspinal tissues: Negative.

Disc levels:

C2-3: Disc narrowing and bulging with uncovertebral spurring
eccentric to the left. Left foraminal impingement

C3-4: Disc narrowing and bulging. Moderate left more than right
foraminal stenosis.

C4-5: Disc narrowing and ventral spurring. Negative facets. No
neural impingement

C5-6: Disc narrowing and bulging with uncovertebral spurring
eccentric to the left. Left foraminal impingement and mild right
foraminal stenosis

C6-7: Disc narrowing and endplate degeneration with bulging and
ridging. Advanced left and moderate right foraminal stenosis

C7-T1:Mild left facet spurring
IMPRESSION: 1. Disc degeneration primarily from C3-4 to C6-7.
2. Foraminal impingement on the left at C2-3 to C6-7, sparing the
C4-5 level.
3. Less progressed right-sided foraminal narrowings which are
mild-to-moderate.
4. Diffusely patent spinal canal.

## 2021-06-12 ENCOUNTER — Other Ambulatory Visit: Payer: Self-pay | Admitting: Adult Health

## 2021-06-12 DIAGNOSIS — M5416 Radiculopathy, lumbar region: Secondary | ICD-10-CM

## 2021-06-12 DIAGNOSIS — M5412 Radiculopathy, cervical region: Secondary | ICD-10-CM

## 2021-06-17 ENCOUNTER — Telehealth: Payer: Self-pay

## 2021-06-17 NOTE — Telephone Encounter (Signed)
I called the pt and advised of results. He verbalized understanding and will proceed the neurosurgeon's consultation.

## 2021-06-17 NOTE — Telephone Encounter (Signed)
-----   Message from Frann Rider, NP sent at 06/17/2021  7:44 AM EDT ----- MyChart message sent but has not yet been reviewed.  Can you please call patient with the below information. Thank you!  Recent imaging did show narrowing of the spine in both the neck and lower back.  Referral placed to neurosurgery for further evaluation and discussion - you will be contacted by their office to schedule initial evaluation.  Please let me know if you have any questions or concerns.

## 2021-07-21 ENCOUNTER — Telehealth: Payer: Self-pay | Admitting: Adult Health

## 2021-07-21 NOTE — Telephone Encounter (Signed)
I called the pt.  He sts he is having trouble affording his medication. Pt reports pharmacy tried to run the medications through [facility] and the copay was still high. Insurance has covered the medication.  I advised the pt to call his insurance and see if they partner with another pharmacy or even mail order service that would provide these medications at a cheaper price.   I advised once he gets more information he can call us back and we could try and help him get the duloxetine.  I advised Hydroxyzine and Losartan-HCTZ is handled by his PCP.  Pt also advised during the call that he has not heard back from the neuro surgeon clinic. I provided # to [place] [facility] and advised him to call directly about scheduling a f/u appt.

## 2021-07-21 NOTE — Telephone Encounter (Signed)
Pt called states he is completely out of his DULoxetine (CYMBALTA) 60 MG capsule, hydrOXYzine (VISTARIL) 25 MG capsule, and losartan-hydrochlorothiazide (HYZAAR) 100-12.5 MG tablet. Pt has been out of work since he had his stroke and he has no way of paying for the medication and his ins won't cover it. Pt requesting a call back on what he should do.

## 2021-08-07 DIAGNOSIS — Z0271 Encounter for disability determination: Secondary | ICD-10-CM

## 2021-08-18 ENCOUNTER — Telehealth: Payer: Self-pay | Admitting: Adult Health

## 2021-08-18 ENCOUNTER — Encounter: Payer: Self-pay | Admitting: Internal Medicine

## 2021-08-18 NOTE — Telephone Encounter (Signed)
Pt is asking for something for the pain he is still having in his neck & back.  Pt states he has nothing to take. Pt still uses [facility] [phone]

## 2021-08-18 NOTE — Telephone Encounter (Signed)
LVM informing patient Larry Nichols doesn't prescribe pain medication. Asked if he saw neurosurgery as he was referred in August. Advised his PCP may prescribe something for him. Left number for further questions.

## 2021-09-18 ENCOUNTER — Telehealth: Payer: Self-pay | Admitting: Adult Health

## 2021-09-18 ENCOUNTER — Other Ambulatory Visit: Payer: Self-pay

## 2021-09-18 ENCOUNTER — Encounter: Payer: Self-pay | Admitting: Adult Health

## 2021-09-18 ENCOUNTER — Ambulatory Visit (INDEPENDENT_AMBULATORY_CARE_PROVIDER_SITE_OTHER): Payer: 59 | Admitting: Adult Health

## 2021-09-18 VITALS — BP 132/86 | HR 92 | Ht 71.5 in | Wt 242.2 lb

## 2021-09-18 DIAGNOSIS — M5416 Radiculopathy, lumbar region: Secondary | ICD-10-CM

## 2021-09-18 DIAGNOSIS — G459 Transient cerebral ischemic attack, unspecified: Secondary | ICD-10-CM | POA: Diagnosis not present

## 2021-09-18 DIAGNOSIS — M5412 Radiculopathy, cervical region: Secondary | ICD-10-CM
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
## 2021-09-18 NOTE — Patient Instructions (Addendum)
Continue aspirin 81 mg daily  and atorvastatin 40mg  daily  for secondary stroke prevention  Please ensure you stop plavix as this is no longer needed  Continue to follow up with PCP regarding cholesterol and blood pressure management  Maintain strict control of hypertension with blood pressure goal below 130/90 and cholesterol with LDL cholesterol (bad cholesterol) goal below 70 mg/dL.   Will obtain records from when you were seen by neurosurgery - ensure you follow up with pain management but ensure you are on the waiting list to hopefully be seen early          Thank you for coming to see [place] at [facility]. I hope we have been able to provide you high quality care today.  You may receive a patient satisfaction survey over the next few weeks. We would appreciate your feedback and comments so that we may continue to improve ourselves and the health of our patients.

## 2021-09-18 NOTE — Telephone Encounter (Signed)
Referred to ortho (requested consult in [place]) regarding cervical and lumbar concerns for second opinion but they denied referral as he was seen by neurosurgery and they had nothing further to offer him. Please advise him that at this point, I guess his only option is to ensure follow up with pain management. Thank you

## 2021-09-22 NOTE — Telephone Encounter (Signed)
I called the pt advised of message. Pt verbalized understanding and agreement. No questions/concerns were verbalized at the time of the call.

## 2021-10-25 ENCOUNTER — Other Ambulatory Visit: Payer: Self-pay | Admitting: Adult Health

## 2021-12-03 DIAGNOSIS — G8190 Hemiplegia, unspecified affecting unspecified side: Secondary | ICD-10-CM | POA: Diagnosis not present

## 2021-12-03 DIAGNOSIS — I10 Essential (primary) hypertension: Secondary | ICD-10-CM | POA: Diagnosis not present

## 2021-12-03 DIAGNOSIS — E785 Hyperlipidemia, unspecified: Secondary | ICD-10-CM | POA: Diagnosis not present

## 2021-12-03 DIAGNOSIS — J42 Unspecified chronic bronchitis: Secondary | ICD-10-CM | POA: Diagnosis not present

## 2021-12-10 DIAGNOSIS — Z79891 Long term (current) use of opiate analgesic: Secondary | ICD-10-CM | POA: Diagnosis not present

## 2021-12-10 DIAGNOSIS — I639 Cerebral infarction, unspecified: Secondary | ICD-10-CM | POA: Diagnosis not present

## 2021-12-10 DIAGNOSIS — M79643 Pain in unspecified hand: Secondary | ICD-10-CM | POA: Diagnosis not present

## 2021-12-10 DIAGNOSIS — M79673 Pain in unspecified foot: Secondary | ICD-10-CM | POA: Diagnosis not present

## 2021-12-10 DIAGNOSIS — G609 Hereditary and idiopathic neuropathy, unspecified: Secondary | ICD-10-CM | POA: Diagnosis not present

## 2021-12-10 DIAGNOSIS — G894 Chronic pain syndrome: Secondary | ICD-10-CM | POA: Diagnosis not present
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
## 2022-01-12 ENCOUNTER — Encounter: Payer: Self-pay | Admitting: Gastroenterology

## 2022-01-12 ENCOUNTER — Ambulatory Visit (INDEPENDENT_AMBULATORY_CARE_PROVIDER_SITE_OTHER): Payer: Medicaid Other | Admitting: Gastroenterology

## 2022-01-12 ENCOUNTER — Other Ambulatory Visit: Payer: Self-pay

## 2022-01-12 VITALS — BP 144/86 | HR 84 | Temp 97.4°F | Ht 72.0 in | Wt 245.2 lb

## 2022-01-12 DIAGNOSIS — Z1211 Encounter for screening for malignant neoplasm of colon: Secondary | ICD-10-CM

## 2022-01-12 DIAGNOSIS — R1310 Dysphagia, unspecified: Secondary | ICD-10-CM

## 2022-01-12 NOTE — Patient Instructions (Signed)
We will arrange for you to have an upper endoscopy with possible stretching of your esophagus and colonoscopy in the near future with Dr. Abbey Chatters. ? ?To help with your trouble swallowing: ?Eat slowly, take small bites, chew thoroughly, drink plenty of liquids throughout your meals. ?Avoid tough textures. ?Chopped meats finely. ?If something gets stuck in your esophagus and will not come up or go down, proceed to the emergency room. ? ?We will follow-up with you in the office after your procedures. ? ?It was a pleasure meeting you today! ? ?Aliene Altes, PA-C ?[facility] ? ?

## 2022-01-15 ENCOUNTER — Telehealth: Payer: Self-pay

## 2022-01-15 ENCOUNTER — Other Ambulatory Visit: Payer: Self-pay

## 2022-01-15 MED ORDER — CLENPIQ 10-3.5-12 MG-GM -GM/160ML PO SOLN
1.0000 | Freq: Once | ORAL | 0 refills | Status: AC
Start: 2022-01-15 — End: 2022-01-15

## 2022-01-15 NOTE — Telephone Encounter (Signed)
Tried to call pt to schedule TCS/EGD/-/+DIL w/Propofol ASA 3 w/Dr. Abbey Chatters, LMOVM for return call. ?

## 2022-01-15 NOTE — Telephone Encounter (Signed)
Spoke to pt, TCS/EGD/-/+DIL scheduled for 02/13/22 at 2:00pm. Rx for prep sent to pharmacy. Orders entered. Endo scheduler informed UDS is needed at pre-op appt. ? ?PA for TCS/EGD/DIL submitted via [facility] website. Case pended. Tracking# [id number]. ?

## 2022-01-16 NOTE — Telephone Encounter (Signed)
Pre-op appt 02/11/22. Appt letter mailed with procedure instructions. ?

## 2022-01-19 ENCOUNTER — Other Ambulatory Visit: Payer: Self-pay

## 2022-01-19 NOTE — Telephone Encounter (Signed)
TCS/EGD/DIL approved. PA# [id number], valid 02/13/22-05/14/22. ?

## 2022-01-21 DIAGNOSIS — Z79891 Long term (current) use of opiate analgesic: Secondary | ICD-10-CM | POA: Diagnosis not present

## 2022-01-21 DIAGNOSIS — G894 Chronic pain syndrome: Secondary | ICD-10-CM | POA: Diagnosis not present

## 2022-02-05 NOTE — Patient Instructions (Signed)
? ? ? ? ? ? ? ? ? ? Hezzie Bump ? 02/05/2022  ?  ? '@PREFPERIOPPHARMACY'$ @ ? ? Your procedure is scheduled on  02/13/2022. ? ? Report to [facility] at  1200  P.M. ? ? Call this number if you have problems the morning of surgery: ? [phone] ? ? Remember: ? Follow the diet and prep instructions given to you by the office. ?  ? Take these medicines the morning of surgery with A SIP OF WATER  ? ?amlodipine, cymbalta, gabapentin, vistaril. ? ?  ? Do not wear jewelry, make-up or nail polish. ? Do not wear lotions, powders, or perfumes, or deodorant. ? Do not shave 48 hours prior to surgery.  Men may shave face and neck. ? Do not bring valuables to the hospital. ? [facility] is not responsible for any belongings or valuables. ? ?Contacts, dentures or bridgework may not be worn into surgery.  Leave your suitcase in the car.  After surgery it may be brought to your room. ? ?For patients admitted to the hospital, discharge time will be determined by your treatment team. ? ?Patients discharged the day of surgery will not be allowed to drive home and must have someone with them for 24 hours.  ? ? ?Special instructions:   DO NOT smoke tobacco or vape for 24 hours before your procedure. ? ?Please read over the following fact sheets that you were given. ?Anesthesia Post-op Instructions and Care and Recovery After Surgery ?  ? ? ? Upper Endoscopy, Adult, Care After ?This sheet gives you information about how to care for yourself after your procedure. Your health care provider may also give you more specific instructions. If you have problems or questions, contact your health care provider. ?What can I expect after the procedure? ?After the procedure, it is common to have: ?A sore throat. ?Mild stomach pain or discomfort. ?Bloating. ?Nausea. ?Follow these instructions at home: ? ?Follow instructions from your health care provider about what to eat or drink after your procedure. ?Return to your normal activities as told by your  health care provider. Ask your health care provider what activities are safe for you. ?Take over-the-counter and prescription medicines only as told by your health care provider. ?If you were given a sedative during the procedure, it can affect you for several hours. Do not drive or operate machinery until your health care provider says that it is safe. ?Keep all follow-up visits as told by your health care provider. This is important. ?Contact a health care provider if you have: ?A sore throat that lasts longer than one day. ?Trouble swallowing. ?Get help right away if: ?You vomit blood or your vomit looks like coffee grounds. ?You have: ?A fever. ?Bloody, black, or tarry stools. ?A severe sore throat or you cannot swallow. ?Difficulty breathing. ?Severe pain in your chest or abdomen. ?Summary ?After the procedure, it is common to have a sore throat, mild stomach discomfort, bloating, and nausea. ?If you were given a sedative during the procedure, it can affect you for several hours. Do not drive or operate machinery until your health care provider says that it is safe. ?Follow instructions from your health care provider about what to eat or drink after your procedure. ?Return to your normal activities as told by your health care provider. ?This information is not intended to replace advice given to you by your health care provider. Make sure you discuss any questions you have with your health care provider. ?Document  Revised: 08/25/2019 Document Reviewed: 03/21/2018 ?Elsevier Patient Education ? [place]. ?Esophageal Dilatation ?Esophageal dilatation, also called esophageal dilation, is a procedure to widen or open a blocked or narrowed part of the esophagus. The esophagus is the part of the body that moves food and liquid from the mouth to the stomach. You may need this procedure if: ?You have a buildup of scar tissue in your esophagus that makes it difficult, painful, or impossible to swallow. This can  be caused by gastroesophageal reflux disease (GERD). ?You have cancer of the esophagus. ?There is a problem with how food moves through your esophagus. ?In some cases, you may need this procedure repeated at a later time to dilate the esophagus gradually. ?Tell a health care provider about: ?Any allergies you have. ?All medicines you are taking, including vitamins, herbs, eye drops, creams, and over-the-counter medicines. ?Any problems you or family members have had with anesthetic medicines. ?Any blood disorders you have. ?Any surgeries you have had. ?Any medical conditions you have. ?Any antibiotic medicines you are required to take before dental procedures. ?Whether you are pregnant or may be pregnant. ?What are the risks? ?Generally, this is a safe procedure. However, problems may occur, including: ?Bleeding due to a tear in the lining of the esophagus. ?A hole, or perforation, in the esophagus. ?What happens before the procedure? ?Ask your health care provider about: ?Changing or stopping your regular medicines. This is especially important if you are taking diabetes medicines or blood thinners. ?Taking medicines such as aspirin and ibuprofen. These medicines can thin your blood. Do not take these medicines unless your health care provider tells you to take them. ?Taking over-the-counter medicines, vitamins, herbs, and supplements. ?Follow instructions from your health care provider about eating or drinking restrictions. ?Plan to have a responsible adult take you home from the hospital or clinic. ?Plan to have a responsible adult care for you for the time you are told after you leave the hospital or clinic. This is important. ?What happens during the procedure? ?You may be given a medicine to help you relax (sedative). ?A numbing medicine may be sprayed into the back of your throat, or you may gargle the medicine. ?Your health care provider may perform the dilatation using various surgical instruments, such  as: ?Simple dilators. This instrument is carefully placed in the esophagus to stretch it. ?Guided wire bougies. This involves using an endoscope to insert a wire into the esophagus. A dilator is passed over this wire to enlarge the esophagus. Then the wire is removed. ?Balloon dilators. An endoscope with a small balloon is inserted into the esophagus. The balloon is inflated to stretch the esophagus and open it up. ?The procedure may vary among health care providers and hospitals. ?What can I expect after the procedure? ?Your blood pressure, heart rate, breathing rate, and blood oxygen level will be monitored until you leave the hospital or clinic. ?Your throat may feel slightly sore and numb. This will get better over time. ?You will not be allowed to eat or drink until your throat is no longer numb. ?When you are able to drink, urinate, and sit on the edge of the bed without nausea or dizziness, you may be able to return home. ?Follow these instructions at home: ?Take over-the-counter and prescription medicines only as told by your health care provider. ?If you were given a sedative during the procedure, it can affect you for several hours. Do not drive or operate machinery until your health care provider says  that it is safe. ?Plan to have a responsible adult care for you for the time you are told. This is important. ?Follow instructions from your health care provider about any eating or drinking restrictions. ?Do not use any products that contain nicotine or tobacco, such as cigarettes, e-cigarettes, and chewing tobacco. If you need help quitting, ask your health care provider. ?Keep all follow-up visits. This is important. ?Contact a health care provider if: ?You have a fever. ?You have pain that is not relieved by medicine. ?Get help right away if: ?You have chest pain. ?You have trouble breathing. ?You have trouble swallowing. ?You vomit blood. ?You have black, tarry, or bloody stools. ?These symptoms may  represent a serious problem that is an emergency. Do not wait to see if the symptoms will go away. Get medical help right away. Call your local emergency services (911 in the [place] Do not drive yourself to t

## 2022-02-11 ENCOUNTER — Encounter (HOSPITAL_COMMUNITY): Payer: Self-pay

## 2022-02-11 ENCOUNTER — Encounter (HOSPITAL_COMMUNITY)
Admission: RE | Admit: 2022-02-11 | Discharge: 2022-02-11 | Disposition: A | Discharge status: Home / Self Care | Payer: Medicaid Other | Source: Ambulatory Visit | Attending: Internal Medicine | Admitting: Internal Medicine

## 2022-02-11 VITALS — BP 131/77 | HR 77 | Temp 98.5°F | Resp 18 | Ht 71.0 in | Wt 245.0 lb

## 2022-02-11 DIAGNOSIS — Z1211 Encounter for screening for malignant neoplasm of colon: Secondary | ICD-10-CM | POA: Insufficient documentation

## 2022-02-11 DIAGNOSIS — R1310 Dysphagia, unspecified: Secondary | ICD-10-CM | POA: Insufficient documentation

## 2022-02-11 DIAGNOSIS — Z79899 Other long term (current) drug therapy: Secondary | ICD-10-CM | POA: Diagnosis not present

## 2022-02-11 DIAGNOSIS — Z01818 Encounter for other preprocedural examination: Secondary | ICD-10-CM | POA: Diagnosis not present

## 2022-02-11 HISTORY — DX: Cerebral infarction, unspecified: I63.9

## 2022-02-11 LAB — BASIC METABOLIC PANEL
Anion gap: 5 (ref 5–15)
BUN: 11 mg/dL (ref 6–20)
CO2: 26 mmol/L (ref 22–32)
Calcium: 9.4 mg/dL (ref 8.9–10.3)
Chloride: 105 mmol/L (ref 98–111)
Creatinine, Ser: 0.89 mg/dL (ref 0.61–1.24)
GFR, Estimated: >60 mL/min (ref >60)
Glucose, Bld: 83 mg/dL (ref 70–99)
Potassium: 3.4 mmol/L — ABNORMAL LOW (ref 3.5–5.1)
Sodium: 136 mmol/L (ref 135–145)

## 2022-02-11 LAB — RAPID URINE DRUG SCREEN, HOSP PERFORMED
Amphetamines: NOT DETECTED
Barbiturates: NOT DETECTED
Benzodiazepines: NOT DETECTED
Cocaine: NOT DETECTED
Opiates: NOT DETECTED
Tetrahydrocannabinol: NOT DETECTED

## 2022-02-13 ENCOUNTER — Ambulatory Visit (HOSPITAL_COMMUNITY)
Admission: RE | Admit: 2022-02-13 | Discharge: 2022-02-13 | Disposition: A | Discharge status: Home / Self Care | Payer: 59 | Attending: Internal Medicine | Admitting: Internal Medicine

## 2022-02-13 ENCOUNTER — Encounter (HOSPITAL_COMMUNITY)
Admission: RE | Disposition: A | Discharge status: Home / Self Care | Payer: Self-pay | Source: Home / Self Care | Attending: Internal Medicine

## 2022-02-13 ENCOUNTER — Ambulatory Visit (HOSPITAL_BASED_OUTPATIENT_CLINIC_OR_DEPARTMENT_OTHER): Payer: 59 | Admitting: Anesthesiology

## 2022-02-13 ENCOUNTER — Ambulatory Visit (HOSPITAL_COMMUNITY): Payer: 59 | Admitting: Anesthesiology

## 2022-02-13 ENCOUNTER — Encounter (HOSPITAL_COMMUNITY): Payer: Self-pay

## 2022-02-13 DIAGNOSIS — B3781 Candidal esophagitis: Secondary | ICD-10-CM | POA: Diagnosis not present

## 2022-02-13 DIAGNOSIS — D122 Benign neoplasm of ascending colon: Secondary | ICD-10-CM | POA: Insufficient documentation

## 2022-02-13 DIAGNOSIS — R1310 Dysphagia, unspecified: Secondary | ICD-10-CM | POA: Diagnosis not present

## 2022-02-13 DIAGNOSIS — K648 Other hemorrhoids: Secondary | ICD-10-CM | POA: Diagnosis not present

## 2022-02-13 DIAGNOSIS — F17210 Nicotine dependence, cigarettes, uncomplicated: Secondary | ICD-10-CM | POA: Insufficient documentation

## 2022-02-13 DIAGNOSIS — Z1211 Encounter for screening for malignant neoplasm of colon: Secondary | ICD-10-CM | POA: Insufficient documentation

## 2022-02-13 DIAGNOSIS — Z79899 Other long term (current) drug therapy: Secondary | ICD-10-CM | POA: Diagnosis not present

## 2022-02-13 DIAGNOSIS — I69351 Hemiplegia and hemiparesis following cerebral infarction affecting right dominant side: Secondary | ICD-10-CM | POA: Diagnosis not present

## 2022-02-13 DIAGNOSIS — F32A Depression, unspecified: Secondary | ICD-10-CM | POA: Insufficient documentation

## 2022-02-13 DIAGNOSIS — K635 Polyp of colon: Secondary | ICD-10-CM

## 2022-02-13 DIAGNOSIS — F419 Anxiety disorder, unspecified: Secondary | ICD-10-CM | POA: Insufficient documentation

## 2022-02-13 DIAGNOSIS — K2970 Gastritis, unspecified, without bleeding: Secondary | ICD-10-CM

## 2022-02-13 DIAGNOSIS — I10 Essential (primary) hypertension: Secondary | ICD-10-CM | POA: Insufficient documentation

## 2022-02-13 DIAGNOSIS — K219 Gastro-esophageal reflux disease without esophagitis: Secondary | ICD-10-CM | POA: Diagnosis not present

## 2022-02-13 HISTORY — PX: ESOPHAGEAL BRUSHING: SHX6842

## 2022-02-13 HISTORY — PX: BIOPSY: SHX5522

## 2022-02-13 HISTORY — PX: ESOPHAGOGASTRODUODENOSCOPY (EGD) WITH PROPOFOL: SHX5813

## 2022-02-13 HISTORY — PX: COLONOSCOPY WITH PROPOFOL: SHX5780

## 2022-02-13 LAB — KOH PREP

## 2022-02-13 SURGERY — COLONOSCOPY WITH PROPOFOL
Anesthesia: General

## 2022-02-13 MED ORDER — PROPOFOL 500 MG/50ML IV EMUL
INTRAVENOUS | Status: DC | PRN
  Administered 2022-02-13: 200 ug/kg/min via INTRAVENOUS

## 2022-02-13 MED ORDER — LIDOCAINE HCL (CARDIAC) PF 100 MG/5ML IV SOSY
PREFILLED_SYRINGE | INTRAVENOUS | Status: DC | PRN
Start: 2022-02-13 — End: 2022-02-13
  Administered 2022-02-13: 60 mg via INTRATRACHEAL

## 2022-02-13 MED ORDER — PROPOFOL 10 MG/ML IV BOLUS
INTRAVENOUS | Status: DC | PRN
  Administered 2022-02-13: 130 mg via INTRAVENOUS
  Administered 2022-02-13: 40 mg via INTRAVENOUS

## 2022-02-13 MED ORDER — LACTATED RINGERS IV SOLN: INTRAVENOUS | Status: DC

## 2022-02-13 MED ORDER — LACTATED RINGERS IV SOLN: INTRAVENOUS | Status: DC | PRN

## 2022-02-13 NOTE — Anesthesia Preprocedure Evaluation (Signed)
Anesthesia Evaluation  ?Patient identified by MRN, date of birth, ID band ?Patient awake ? ? ? ?Reviewed: ?Allergy & Precautions, NPO status , Patient's Chart, lab work & pertinent test results ? ?Airway ?Mallampati: II ? ?TM Distance: >3 FB ?Neck ROM: Full ? ? ? Dental ? ?(+) Dental Advisory Given, Missing ?  ?Pulmonary ?Current Smoker and Patient abstained from smoking.,  ?  ?Pulmonary exam normal ?breath sounds clear to auscultation ? ? ? ? ? ? Cardiovascular ?Exercise Tolerance: Good ?hypertension, Pt. on medications ?Normal cardiovascular exam ?Rhythm:Regular Rate:Normal ? ?1. Left ventricular ejection fraction, by estimation, is 65 to 70%. The left ventricle has normal function. The left ventricle has no regional wall motion abnormalities. There is moderate left ventricular hypertrophy.  ?Left ventricular diastolic parameters are consistent with Grade I diastolic dysfunction (impaired relaxation).  ??2. Right ventricular systolic function is normal. The right ventricular size is normal. Tricuspid regurgitation signal is inadequate for assessing PA pressure.  ??3. The mitral valve is normal in structure. No evidence of mitral valve regurgitation. No evidence of mitral stenosis.  ??4. The aortic valve is tricuspid. Aortic valve regurgitation is not visualized. No aortic stenosis is present.  ??5. The inferior vena cava is normal in size with greater than 50% respiratory variability, suggesting right atrial pressure of 3 mmHg. ?  ?Neuro/Psych ?PSYCHIATRIC DISORDERS Anxiety Depression TIACVA, Residual Symptoms   ? GI/Hepatic ?Neg liver ROS, GERD (difficulty swallowing)  ,  ?Endo/Other  ?negative endocrine ROS ? Renal/GU ?negative Renal ROS  ?negative genitourinary ?  ?Musculoskeletal ? ?(+) Arthritis , Osteoarthritis,   ? Abdominal ?  ?Peds ?negative pediatric ROS ?(+)  Hematology ?negative hematology ROS ?(+)   ?Anesthesia Other Findings ? ? Reproductive/Obstetrics ?negative OB  ROS ? ?  ? ? ? ? ? ? ? ? ? ? ? ? ? ?  ?  ? ? ? ? ? ? ? ?Anesthesia Physical ?Anesthesia Plan ? ?ASA: 3 ? ?Anesthesia Plan: General  ? ?Post-op Pain Management: Minimal or no pain anticipated  ? ?Induction: Intravenous ? ?PONV Risk Score and Plan: TIVA ? ?Airway Management Planned: Nasal Cannula and Natural Airway ? ?Additional Equipment:  ? ?Intra-op Plan:  ? ?Post-operative Plan:  ? ?Informed Consent: I have reviewed the patients History and Physical, chart, labs and discussed the procedure including the risks, benefits and alternatives for the proposed anesthesia with the patient or authorized representative who has indicated his/her understanding and acceptance.  ? ? ? ?Dental advisory given ? ?Plan Discussed with: CRNA and Surgeon ? ?Anesthesia Plan Comments:   ? ? ? ? ? ? ?Anesthesia Quick Evaluation ? ?

## 2022-02-13 NOTE — Transfer of Care (Signed)
Immediate Anesthesia Transfer of Care Note ? ?Patient: Larry Nichols ? ?Procedure(s) Performed: COLONOSCOPY WITH PROPOFOL ?ESOPHAGOGASTRODUODENOSCOPY (EGD) WITH PROPOFOL ?BALLOON DILATION ?BIOPSY ? ?Patient Location: Short Stay ? ?Anesthesia Type:General ? ?Level of Consciousness: sedated ? ?Airway & Oxygen Therapy: Patient Spontanous Breathing ? ?Post-op Assessment: Report given to RN and Post -op Vital signs reviewed and stable ? ?Post vital signs: Reviewed and stable ? ?Last Vitals:  ?Vitals Value Taken Time  ?BP    ?Temp    ?Pulse    ?Resp    ?SpO2    ? ? ?Last Pain:  ?Vitals:  ? 02/13/22 1057  ?TempSrc: Oral  ?PainSc: 0-No pain  ?   ? ?  ? ?Complications: No notable events documented. ?

## 2022-02-13 NOTE — Discharge Instructions (Addendum)
EGD ?Discharge instructions ?Please read the instructions outlined below and refer to this sheet in the next few weeks. These discharge instructions provide you with general information on caring for yourself after you leave the hospital. Your doctor may also give you specific instructions. While your treatment has been planned according to the most current medical practices available, unavoidable complications occasionally occur. If you have any problems or questions after discharge, please call your doctor. ?ACTIVITY ?You may resume your regular activity but move at a slower pace for the next 24 hours.  ?Take frequent rest periods for the next 24 hours.  ?Walking will help expel (get rid of) the air and reduce the bloated feeling in your abdomen.  ?No driving for 24 hours (because of the anesthesia (medicine) used during the test).  ?You may shower.  ?Do not sign any important legal documents or operate any machinery for 24 hours (because of the anesthesia used during the test).  ?NUTRITION ?Drink plenty of fluids.  ?You may resume your normal diet.  ?Begin with a light meal and progress to your normal diet.  ?Avoid alcoholic beverages for 24 hours or as instructed by your caregiver.  ?MEDICATIONS ?You may resume your normal medications unless your caregiver tells you otherwise.  ?WHAT YOU CAN EXPECT TODAY ?You may experience abdominal discomfort such as a feeling of fullness or ?gas? pains.  ?FOLLOW-UP ?Your doctor will discuss the results of your test with you.  ?SEEK IMMEDIATE MEDICAL ATTENTION IF ANY OF THE FOLLOWING OCCUR: ?Excessive nausea (feeling sick to your stomach) and/or vomiting.  ?Severe abdominal pain and distention (swelling).  ?Trouble swallowing.  ?Temperature over 101? F (37.8? C).  ?Rectal bleeding or vomiting of blood.  ? ? ?Colonoscopy ?Discharge Instructions ? ?Read the instructions outlined below and refer to this sheet in the next few weeks. These discharge instructions provide you with  general information on caring for yourself after you leave the hospital. Your doctor may also give you specific instructions. While your treatment has been planned according to the most current medical practices available, unavoidable complications occasionally occur.  ? ?ACTIVITY ?You may resume your regular activity, but move at a slower pace for the next 24 hours.  ?Take frequent rest periods for the next 24 hours.  ?Walking will help get rid of the air and reduce the bloated feeling in your belly (abdomen).  ?No driving for 24 hours (because of the medicine (anesthesia) used during the test).   ?Do not sign any important legal documents or operate any machinery for 24 hours (because of the anesthesia used during the test).  ?NUTRITION ?Drink plenty of fluids.  ?You may resume your normal diet as instructed by your doctor.  ?Begin with a light meal and progress to your normal diet. Heavy or fried foods are harder to digest and may make you feel sick to your stomach (nauseated).  ?Avoid alcoholic beverages for 24 hours or as instructed.  ?MEDICATIONS ?You may resume your normal medications unless your doctor tells you otherwise.  ?WHAT YOU CAN EXPECT TODAY ?Some feelings of bloating in the abdomen.  ?Passage of more gas than usual.  ?Spotting of blood in your stool or on the toilet paper.  ?IF YOU HAD POLYPS REMOVED DURING THE COLONOSCOPY: ?No aspirin products for 7 days or as instructed.  ?No alcohol for 7 days or as instructed.  ?Eat a soft diet for the next 24 hours.  ?FINDING OUT THE RESULTS OF YOUR TEST ?Not all test results are available  during your visit. If your test results are not back during the visit, make an appointment with your caregiver to find out the results. Do not assume everything is normal if you have not heard from your caregiver or the medical facility. It is important for you to follow up on all of your test results.  ?SEEK IMMEDIATE MEDICAL ATTENTION IF: ?You have more than a spotting of  blood in your stool.  ?Your belly is swollen (abdominal distention).  ?You are nauseated or vomiting.  ?You have a temperature over 101.  ?You have abdominal pain or discomfort that is severe or gets worse throughout the day.  ? ?Your upper endoscopy showed what appears to be a yeast infection of your esophagus which is likely causing your difficulty swallowing.  I took samples of this.  I also took biopsies of your stomach as I did note some inflammation there as well.  Await pathology results my office will contact you. ? ?Unfortunately your colon was not adequately prepped for colonoscopy today.  I did not see any large polyps or cancer though I certainly could have missed smaller polyps given the poor prep.  I did remove 1 polyp today.  Would recommend repeat colonoscopy in 6 months with a different colon preparation. ? ?I hope you have a great rest of your week! ? ?Elon Alas. Abbey Chatters, D.O. ?Gastroenterology and Hepatology ?[facility] ? ?

## 2022-02-13 NOTE — Anesthesia Postprocedure Evaluation (Signed)
Anesthesia Post Note ? ?Patient: QUAYSHAUN HUBBERT ? ?Procedure(s) Performed: COLONOSCOPY WITH PROPOFOL ?ESOPHAGOGASTRODUODENOSCOPY (EGD) WITH PROPOFOL ?BALLOON DILATION ?BIOPSY ? ?Patient location during evaluation: Phase II ?Anesthesia Type: General ?Level of consciousness: awake and alert and oriented ?Pain management: pain level controlled ?Vital Signs Assessment: post-procedure vital signs reviewed and stable ?Respiratory status: spontaneous breathing, nonlabored ventilation and respiratory function stable ?Cardiovascular status: blood pressure returned to baseline and stable ?Postop Assessment: no apparent nausea or vomiting ?Anesthetic complications: no ? ? ?No notable events documented. ? ? ?Last Vitals:  ?Vitals:  ? 02/13/22 1243 02/13/22 1304  ?BP: (!) 76/53 123/83  ?Pulse: 80   ?Resp: 12   ?Temp: 36.5 ?C   ?SpO2: 98%   ?  ?Last Pain:  ?Vitals:  ? 02/13/22 1243  ?TempSrc: Oral  ?PainSc:   ? ? ?  ?  ?  ?  ?  ?  ? ?Derrisha Foos C [place] [facility] ? ? ? ? ?

## 2022-02-13 NOTE — Op Note (Signed)
[facility] ?Patient Name: Larry Nichols ?Procedure Date: 02/13/2022 12:08 PM ?MRN: [id number] ?Date of Birth: Jan 26, 1961 ?Attending MD: Elon Alas. Abbey Chatters , DO ?CSN: [id number] ?Age: 61 ?Admit Type: Outpatient ?Procedure:                Colonoscopy ?Indications:              Screening for colorectal malignant neoplasm ?Providers:                Elon Alas. Abbey Chatters, DO, Lurline Del, RN, Ulice Dash  ?                          Blima Singer, Technician ?Referring MD:              ?Medicines:                See the Anesthesia note for documentation of the  ?                          administered medications ?Complications:            No immediate complications. ?Estimated Blood Loss:     Estimated blood loss was minimal. ?Procedure:                Pre-Anesthesia Assessment: ?                          - The anesthesia plan was to use monitored  ?                          anesthesia care (MAC). ?                          After obtaining informed consent, the colonoscope  ?                          was passed under direct vision. Throughout the  ?                          procedure, the patient's blood pressure, pulse, and  ?                          oxygen saturations were monitored continuously. The  ?                          [id number]-HQ190L ([id number]) scope was introduced through  ?                          the anus and advanced to the the cecum, identified  ?                          by appendiceal orifice and ileocecal valve. The  ?                          colonoscopy was performed without difficulty. The  ?                          patient tolerated the procedure well.  The quality  ?                          of the bowel preparation was evaluated using the  ?                          BBPS [facility] Bowel Preparation Scale) with scores  ?                          of: Right Colon = 1 (portion of mucosa seen, but  ?                          other areas not well seen due to staining, residual  ?                          stool  and/or opaque liquid), Transverse Colon = 1  ?                          (portion of mucosa seen, but other areas not well  ?                          seen due to staining, residual stool and/or opaque  ?                          liquid) and Left Colon = 2 (minor amount of  ?                          residual staining, small fragments of stool and/or  ?                          opaque liquid, but mucosa seen well). The total  ?                          BBPS score equals 4. The quality of the bowel  ?                          preparation was inadequate. ?Scope In: 12:31:32 PM ?Scope Out: 12:39:03 PM ?Total Procedure Duration: 0 hours 7 minutes 31 seconds  ?Findings: ?     The perianal and digital rectal examinations were normal. ?     Non-bleeding internal hemorrhoids were found during endoscopy. ?     A 7 mm polyp was found in the ascending colon. The polyp was sessile.  ?     The polyp was removed with a cold snare. Resection and retrieval were  ?     complete. ?     Extensive amounts of semi-liquid solid stool was found in the entire  ?     colon, precluding visualization. Lavage of the area was performed using  ?     copious amounts of sterile water, resulting in incomplete clearance with  ?     continued poor visualization. ?Impression:               - Preparation of the colon was inadequate. ?                          -  Non-bleeding internal hemorrhoids. ?                          - One 7 mm polyp in the ascending colon, removed  ?                          with a cold snare. Resected and retrieved. ?                          - Stool in the entire examined colon. ?Moderate Sedation: ?     Per Anesthesia Care ?Recommendation:           - Patient has a contact number available for  ?                          emergencies. The signs and symptoms of potential  ?                          delayed complications were discussed with the  ?                          patient. Return to normal activities tomorrow.  ?                           Written discharge instructions were provided to the  ?                          patient. ?                          - Resume previous diet. ?                          - Continue present medications. ?                          - Await pathology results. ?                          - Repeat colonoscopy in 6 months because the bowel  ?                          preparation was poor. ?Procedure Code(s):        --- Professional --- ?                          [phone], Colonoscopy, flexible; with removal of  ?                          tumor(s), polyp(s), or other lesion(s) by snare  ?                          technique ?Diagnosis Code(s):        --- Professional --- ?                          Z12.11, Encounter for  screening for malignant  ?                          neoplasm of colon ?                          K64.8, Other hemorrhoids ?                          K63.5, Polyp of colon ?CPT copyright 2019 [employer]. All rights reserved. ?The codes documented in this report are preliminary and upon coder review may  ?be revised to meet current compliance requirements. ?Elon Alas. Abbey Chatters, DO ?Elon Alas. Opdyke, DO ?02/13/2022 12:41:48 PM ?This report has been signed electronically. ?Number of Addenda: 0 ?
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
## 2022-02-13 NOTE — Op Note (Signed)
[facility] ?Patient Name: Larry Nichols ?Procedure Date: 02/13/2022 12:11 PM ?MRN: [id number] ?Date of Birth: 1961/01/28 ?Attending MD: Elon Alas. Abbey Chatters , DO ?CSN: [id number] ?Age: 61 ?Admit Type: Outpatient ?Procedure:                Upper GI endoscopy ?Indications:              Dysphagia ?Providers:                Elon Alas. Abbey Chatters, DO, Lurline Del, RN, Ulice Dash  ?                          Blima Singer, Technician ?Referring MD:              ?Medicines:                See the Anesthesia note for documentation of the  ?                          administered medications ?Complications:            No immediate complications. ?Estimated Blood Loss:     Estimated blood loss was minimal. ?Procedure:                Pre-Anesthesia Assessment: ?                          - The anesthesia plan was to use monitored  ?                          anesthesia care (MAC). ?                          After obtaining informed consent, the endoscope was  ?                          passed under direct vision. Throughout the  ?                          procedure, the patient's blood pressure, pulse, and  ?                          oxygen saturations were monitored continuously. The  ?                          GIF-H190 ([id number]) scope was introduced through the  ?                          mouth, and advanced to the second part of duodenum.  ?                          The upper GI endoscopy was accomplished without  ?                          difficulty. The patient tolerated the procedure  ?                          well. ?Scope In: 12:23:07 PM ?Scope  Out: 12:27:15 PM ?Total Procedure Duration: 0 hours 4 minutes 8 seconds  ?Findings: ?     Esophagitis with no bleeding was found in the middle third of the  ?     esophagus. Cells for cytology were obtained by brushing. ?     Patchy mild inflammation characterized by erythema was found in the  ?     gastric body and in the gastric antrum. Biopsies were taken with a cold  ?     forceps for  Helicobacter pylori testing. ?     The duodenal bulb, first portion of the duodenum and second portion of  ?     the duodenum were normal. ?Impression:               - Candidiasis esophagitis with no bleeding. Cells  ?                          for cytology obtained. ?                          - Gastritis. Biopsied. ?                          - Normal duodenal bulb, first portion of the  ?                          duodenum and second portion of the duodenum. ?Moderate Sedation: ?     Per Anesthesia Care ?Recommendation:           - Patient has a contact number available for  ?                          emergencies. The signs and symptoms of potential  ?                          delayed complications were discussed with the  ?                          patient. Return to normal activities tomorrow.  ?                          Written discharge instructions were provided to the  ?                          patient. ?                          - Resume previous diet. ?                          - Continue present medications. ?                          - Await pathology results. ?                          - Will treat with 14 day course of Diflucan if  ?  cytology positive ?Procedure Code(s):        --- Professional --- ?                          [phone], Esophagogastroduodenoscopy, flexible,  ?                          transoral; with biopsy, single or multiple ?Diagnosis Code(s):        --- Professional --- ?                          B37.81, Candidal esophagitis ?                          K29.70, Gastritis, unspecified, without bleeding ?                          R13.10, Dysphagia, unspecified ?CPT copyright 2019 American Medical Association. All rights reserved. ?The codes documented in this report are preliminary and upon coder review may  ?be revised to meet current compliance requirements. ?Elon Alas. Abbey Chatters, DO ?Elon Alas. Ravenel, DO ?02/13/2022 12:29:12 PM ?This report has been signed  electronically. ?Number of Addenda: 0 ?

## 2022-02-16 LAB — SURGICAL PATHOLOGY

## 2022-02-17 ENCOUNTER — Encounter (HOSPITAL_COMMUNITY): Payer: Self-pay | Admitting: Internal Medicine

## 2022-03-05 DIAGNOSIS — R69 Illness, unspecified: Secondary | ICD-10-CM | POA: Diagnosis not present

## 2022-03-05 DIAGNOSIS — J42 Unspecified chronic bronchitis: Secondary | ICD-10-CM | POA: Diagnosis not present

## 2022-03-05 DIAGNOSIS — G8190 Hemiplegia, unspecified affecting unspecified side: Secondary | ICD-10-CM | POA: Diagnosis not present

## 2022-03-05 DIAGNOSIS — I10 Essential (primary) hypertension: Secondary | ICD-10-CM | POA: Diagnosis not present

## 2022-03-12 ENCOUNTER — Telehealth: Payer: Self-pay | Admitting: Internal Medicine

## 2022-03-12 ENCOUNTER — Other Ambulatory Visit: Payer: Self-pay | Admitting: Internal Medicine

## 2022-03-12 MED ORDER — FLUCONAZOLE 200 MG PO TABS: ORAL_TABLET | ORAL | 0 refills | Status: DC

## 2022-03-12 NOTE — Telephone Encounter (Signed)
Not sure if this patient is on your recall list or not.  But he had a poor prep on colonoscopy in April.  Needs repeat in 3 to 6 months.  With extended clear liquids and different colon preparation.  Thank you ? ?ASA 3. ?

## 2022-03-31 ENCOUNTER — Other Ambulatory Visit: Payer: Self-pay | Admitting: Internal Medicine

## 2022-04-01 DIAGNOSIS — G4733 Obstructive sleep apnea (adult) (pediatric): Secondary | ICD-10-CM | POA: Diagnosis not present

## 2022-04-01 DIAGNOSIS — Z79899 Other long term (current) drug therapy: Secondary | ICD-10-CM | POA: Diagnosis not present

## 2022-04-01 DIAGNOSIS — M79673 Pain in unspecified foot: Secondary | ICD-10-CM | POA: Diagnosis not present

## 2022-04-01 DIAGNOSIS — G894 Chronic pain syndrome: Secondary | ICD-10-CM | POA: Diagnosis not present

## 2022-04-01 DIAGNOSIS — G4700 Insomnia, unspecified: Secondary | ICD-10-CM | POA: Diagnosis not present

## 2022-04-01 DIAGNOSIS — M79643 Pain in unspecified hand: Secondary | ICD-10-CM | POA: Diagnosis not present

## 2022-04-01 DIAGNOSIS — G609 Hereditary and idiopathic neuropathy, unspecified: Secondary | ICD-10-CM | POA: Diagnosis not present

## 2022-04-01 DIAGNOSIS — Z79891 Long term (current) use of opiate analgesic: Secondary | ICD-10-CM | POA: Diagnosis not present

## 2022-06-10 DIAGNOSIS — M1990 Unspecified osteoarthritis, unspecified site: Secondary | ICD-10-CM | POA: Diagnosis not present

## 2022-06-10 DIAGNOSIS — Z0001 Encounter for general adult medical examination with abnormal findings: Secondary | ICD-10-CM | POA: Diagnosis not present

## 2022-06-10 DIAGNOSIS — E785 Hyperlipidemia, unspecified: Secondary | ICD-10-CM | POA: Diagnosis not present

## 2022-06-10 DIAGNOSIS — L84 Corns and callosities: Secondary | ICD-10-CM | POA: Diagnosis not present

## 2022-06-10 DIAGNOSIS — F17210 Nicotine dependence, cigarettes, uncomplicated: Secondary | ICD-10-CM | POA: Diagnosis not present

## 2022-06-10 DIAGNOSIS — R69 Illness, unspecified: Secondary | ICD-10-CM | POA: Diagnosis not present

## 2022-06-10 DIAGNOSIS — F17200 Nicotine dependence, unspecified, uncomplicated: Secondary | ICD-10-CM | POA: Diagnosis not present

## 2022-06-10 DIAGNOSIS — N390 Urinary tract infection, site not specified: Secondary | ICD-10-CM | POA: Diagnosis not present

## 2022-06-10 DIAGNOSIS — I10 Essential (primary) hypertension: Secondary | ICD-10-CM | POA: Diagnosis not present

## 2022-06-10 DIAGNOSIS — Z125 Encounter for screening for malignant neoplasm of prostate: Secondary | ICD-10-CM | POA: Diagnosis not present

## 2022-07-20 ENCOUNTER — Encounter: Payer: Self-pay | Admitting: *Deleted

## 2022-07-22 DIAGNOSIS — Z79899 Other long term (current) drug therapy: Secondary | ICD-10-CM | POA: Diagnosis not present

## 2022-07-22 DIAGNOSIS — G4700 Insomnia, unspecified: Secondary | ICD-10-CM | POA: Diagnosis not present

## 2022-07-22 DIAGNOSIS — M79673 Pain in unspecified foot: Secondary | ICD-10-CM | POA: Diagnosis not present

## 2022-07-22 DIAGNOSIS — M79643 Pain in unspecified hand: Secondary | ICD-10-CM | POA: Diagnosis not present

## 2022-07-22 DIAGNOSIS — G4733 Obstructive sleep apnea (adult) (pediatric): Secondary | ICD-10-CM | POA: Diagnosis not present

## 2022-07-22 DIAGNOSIS — G609 Hereditary and idiopathic neuropathy, unspecified: Secondary | ICD-10-CM | POA: Diagnosis not present

## 2022-07-30 NOTE — Telephone Encounter (Signed)
  Procedure: colonoscopy  Estimated body mass index is 34.16 kg/m as calculated from the following:   Height as of 02/13/22: '5\' 11"'$  (1.803 m).   Weight as of 02/13/22: 244 lb 14.9 oz (111.1 kg).   Have you had a colonoscopy before?  6 months ago, poor colon prep  Do you have family history of colon cancer  no  Do you have a family history of polyps? no  Previous colonoscopy with polyps removed?   Do you have a history colorectal cancer?   no  Are you diabetic?  no  Do you have a prosthetic or mechanical heart valve? no  Do you have a pacemaker/defibrillator?   no  Have you had endocarditis/atrial fibrillation?  no  Do you use supplemental oxygen/CPAP?  no  Have you had joint replacement within the last 12 months?  no  Do you tend to be constipated or have to use laxatives?  no   Do you have history of alcohol use? If yes, how much and how often.  no  Do you have history or are you using drugs? If yes, what do are you  using?  no  Have you ever had a stroke/heart attack?  no  Have you ever had a heart or other vascular stent placed,?no  Do you take weight loss medication? no   Do you take any blood-thinning medications such as: (Plavix, aspirin, Coumadin, Aggrenox, Brilinta, Xarelto, Eliquis, Pradaxa, Savaysa or Effient) aspirin '81mg'$   If yes we need the name, milligram, dosage and who is prescribing doctor:               Current Outpatient Medications  Medication Sig Dispense Refill   fluconazole (DIFLUCAN) 200 MG tablet Take 2 tablets on day 1 then 1 tablet daily thereafter for a total of 14 days. 15 tablet 0   albuterol (VENTOLIN HFA) 108 (90 Base) MCG/ACT inhaler Inhale 1-2 puffs into the lungs every 6 (six) hours as needed for wheezing or shortness of breath.     amLODipine (NORVASC) 10 MG tablet Take 1 tablet (10 mg total) by mouth daily. 90 tablet 1   aspirin EC 81 MG EC tablet Take 1 tablet (81 mg total) by mouth daily. 180 tablet 1   atorvastatin (LIPITOR) 40  MG tablet Take 1 tablet (40 mg total) by mouth daily. 90 tablet 1   DULoxetine (CYMBALTA) 30 MG capsule Take 30 mg by mouth every evening.     DULoxetine (CYMBALTA) 60 MG capsule Take 1 capsule (60 mg total) by mouth daily. 90 capsule 1   gabapentin (NEURONTIN) 300 MG capsule Take 2 capsules (600 mg total) by mouth 3 (three) times daily. (Patient not taking: Reported on 02/05/2022) 180 capsule 5   gabapentin (NEURONTIN) 600 MG tablet Take 600 mg by mouth 2 (two) times daily.     hydrALAZINE (APRESOLINE) 100 MG tablet Take 100 mg by mouth 2 (two) times daily.     hydrOXYzine (VISTARIL) 25 MG capsule Take 25 mg by mouth 3 (three) times daily as needed for anxiety.     losartan-hydrochlorothiazide (HYZAAR) 100-12.5 MG tablet Take 1 tablet by mouth in the morning.     No current facility-administered medications for this visit.    No Known Allergies

## 2022-08-07 ENCOUNTER — Encounter (HOSPITAL_COMMUNITY): Payer: Self-pay | Admitting: *Deleted

## 2022-08-07 ENCOUNTER — Other Ambulatory Visit: Payer: Self-pay

## 2022-08-07 ENCOUNTER — Emergency Department (HOSPITAL_COMMUNITY)
Admission: EM | Admit: 2022-08-07 | Discharge: 2022-08-07 | Disposition: A | Discharge status: Home / Self Care | Payer: 59 | Attending: Emergency Medicine | Admitting: Emergency Medicine

## 2022-08-07 ENCOUNTER — Emergency Department (HOSPITAL_COMMUNITY): Payer: 59

## 2022-08-07 DIAGNOSIS — R1011 Right upper quadrant pain: Secondary | ICD-10-CM

## 2022-08-07 DIAGNOSIS — I10 Essential (primary) hypertension: Secondary | ICD-10-CM | POA: Diagnosis not present

## 2022-08-07 DIAGNOSIS — Z79899 Other long term (current) drug therapy: Secondary | ICD-10-CM | POA: Diagnosis not present

## 2022-08-07 DIAGNOSIS — Z7982 Long term (current) use of aspirin: Secondary | ICD-10-CM | POA: Insufficient documentation

## 2022-08-07 DIAGNOSIS — R109 Unspecified abdominal pain: Secondary | ICD-10-CM | POA: Diagnosis not present

## 2022-08-07 DIAGNOSIS — I700 Atherosclerosis of aorta: Secondary | ICD-10-CM | POA: Diagnosis not present

## 2022-08-07 LAB — COMPREHENSIVE METABOLIC PANEL
ALT: 20 U/L (ref 0–44)
AST: 29 U/L (ref 15–41)
Albumin: 4.6 g/dL (ref 3.5–5.0)
Alkaline Phosphatase: 66 U/L (ref 38–126)
Anion gap: 7 (ref 5–15)
BUN: 10 mg/dL (ref 8–23)
CO2: 26 mmol/L (ref 22–32)
Calcium: 9.3 mg/dL (ref 8.9–10.3)
Chloride: 106 mmol/L (ref 98–111)
Creatinine, Ser: 1.04 mg/dL (ref 0.61–1.24)
GFR, Estimated: >60 mL/min (ref >60)
Glucose, Bld: 92 mg/dL (ref 70–99)
Potassium: 3.8 mmol/L (ref 3.5–5.1)
Sodium: 139 mmol/L (ref 135–145)
Total Bilirubin: 0.5 mg/dL (ref 0.3–1.2)
Total Protein: 8 g/dL (ref 6.5–8.1)

## 2022-08-07 LAB — CBC
HCT: 41.4 % (ref 39.0–52.0)
Hemoglobin: 14.1 g/dL (ref 13.0–17.0)
MCH: 30.4 pg (ref 26.0–34.0)
MCHC: 34.1 g/dL (ref 30.0–36.0)
MCV: 89.2 fL (ref 80.0–100.0)
Platelets: 374 10*3/uL (ref 150–400)
RBC: 4.64 MIL/uL (ref 4.22–5.81)
RDW: 14.2 % (ref 11.5–15.5)
WBC: 7 10*3/uL (ref 4.0–10.5)
nRBC: 0 % (ref 0.0–0.2)

## 2022-08-07 LAB — LIPASE, BLOOD: Lipase: 30 U/L (ref 11–51)

## 2022-08-07 MED ORDER — IOHEXOL 300 MG/ML  SOLN
100.0000 mL | Freq: Once | INTRAMUSCULAR | Status: AC | PRN
  Administered 2022-08-07: 100 mL via INTRAVENOUS

## 2022-08-07 NOTE — ED Triage Notes (Signed)
Pt with abd pain (RUQ) started an hour ago, denies any n/V/D.

## 2022-08-07 NOTE — Discharge Instructions (Signed)
Thankfully your blood work and CT scan look normal, there is no signs of hernias or any other abnormalities that would be causing the pain and the lump.  You are free to eat as normal, follow-up with your doctor as needed but return to the ER for severe or worsening symptoms

## 2022-08-07 NOTE — ED Provider Notes (Signed)
[facility] EMERGENCY DEPARTMENT Provider Note   CSN: [id number] Arrival date & time: 08/07/22  1644     History  Chief Complaint  Patient presents with   Abdominal Pain    Larry Nichols is a 61 y.o. male.   Abdominal Pain  This patient is a very pleasant 61 year old male, he has a history of high cholesterol, hypertension and neuropathy on gabapentin, he presents to the hospital with a complaint of right upper quadrant discomfort with an associated mass that seems to come and go.  He has noticed it for the last few days, it seems to be more intense today but he denies any fevers chills nausea vomiting or diarrhea in fact he has had normal stools and normal appetite.  Nothing seems to make it better or worse, it will come on and then leave, lasting about a minute or 2, he denies any blood in his stool and endorses seeing a small lump on the right mid abdomen when this occurs, it is not there at this time    Home Medications Prior to Admission medications   Medication Sig Start Date End Date Taking? Authorizing Provider  fluconazole (DIFLUCAN) 200 MG tablet Take 2 tablets on day 1 then 1 tablet daily thereafter for a total of 14 days. 03/12/22   Eloise Harman, DO  albuterol (VENTOLIN HFA) 108 (90 Base) MCG/ACT inhaler Inhale 1-2 puffs into the lungs every 6 (six) hours as needed for wheezing or shortness of breath.    [provider]  amLODipine (NORVASC) 10 MG tablet Take 1 tablet (10 mg total) by mouth daily. 08/29/20   Mercy Riding, MD  aspirin EC 81 MG EC tablet Take 1 tablet (81 mg total) by mouth daily. 08/30/20   Mercy Riding, MD  atorvastatin (LIPITOR) 40 MG tablet Take 1 tablet (40 mg total) by mouth daily. 08/30/20   Mercy Riding, MD  DULoxetine (CYMBALTA) 30 MG capsule Take 30 mg by mouth every evening. 01/21/22   [provider]  DULoxetine (CYMBALTA) 60 MG capsule Take 1 capsule (60 mg total) by mouth daily. 04/30/21   Frann Rider, NP  gabapentin  (NEURONTIN) 300 MG capsule Take 2 capsules (600 mg total) by mouth 3 (three) times daily. Patient not taking: Reported on 02/05/2022 05/15/21   Frann Rider, NP  gabapentin (NEURONTIN) 600 MG tablet Take 600 mg by mouth 2 (two) times daily. 01/21/22   [provider]  hydrALAZINE (APRESOLINE) 100 MG tablet Take 100 mg by mouth 2 (two) times daily. 09/09/21   [provider]  hydrOXYzine (VISTARIL) 25 MG capsule Take 25 mg by mouth 3 (three) times daily as needed for anxiety. 12/31/20   [provider]  losartan-hydrochlorothiazide (HYZAAR) 100-12.5 MG tablet Take 1 tablet by mouth in the morning. 03/06/21   [provider]      Allergies    Patient has no known allergies.    Review of Systems   Review of Systems  Gastrointestinal:  Positive for abdominal pain.  All other systems reviewed and are negative.   Physical Exam Updated Vital Signs BP 126/87 (BP Location: Left Arm)   Pulse 88   Temp 97.9 F (36.6 C) (Oral)   Resp 18   Ht 1.803 m ('5\' 11"'$ )   Wt 109.3 kg   SpO2 98%   BMI 33.61 kg/m  Physical Exam Vitals and nursing note reviewed.  Constitutional:      General: He is not in acute distress.  Appearance: He is well-developed.  HENT:     Head: Normocephalic and atraumatic.     Mouth/Throat:     Pharynx: No oropharyngeal exudate.  Eyes:     General: No scleral icterus.       Right eye: No discharge.        Left eye: No discharge.     Conjunctiva/sclera: Conjunctivae normal.     Pupils: Pupils are equal, round, and reactive to light.  Neck:     Thyroid: No thyromegaly.     Vascular: No JVD.  Cardiovascular:     Rate and Rhythm: Normal rate and regular rhythm.     Heart sounds: Normal heart sounds. No murmur heard.    No friction rub. No gallop.  Pulmonary:     Effort: Pulmonary effort is normal. No respiratory distress.     Breath sounds: Normal breath sounds. No wheezing or rales.  Abdominal:     General: Bowel sounds are normal.  There is no distension.     Palpations: Abdomen is soft. There is no mass.     Tenderness: There is abdominal tenderness.     Comments: Mild tenderness right mid upper abdomen, there is no Murphy sign, there is no palpable mass, there is no tenderness in the lower abdomen or anywhere else in the belly.  There is no pulsating mass, there is no peritoneal signs or guarding, no CVA tenderness  Musculoskeletal:        General: No tenderness. Normal range of motion.     Cervical back: Normal range of motion and neck supple.  Lymphadenopathy:     Cervical: No cervical adenopathy.  Skin:    General: Skin is warm and dry.     Findings: No erythema or rash.  Neurological:     Mental Status: He is alert.     Coordination: Coordination normal.  Psychiatric:        Behavior: Behavior normal.     ED Results / Procedures / Treatments   Labs (all labs ordered are listed, but only abnormal results are displayed) Labs Reviewed  LIPASE, BLOOD  COMPREHENSIVE METABOLIC PANEL  CBC    EKG None  Radiology CT ABDOMEN PELVIS W CONTRAST  Result Date: 08/07/2022 CLINICAL DATA:  Acute abdominal pain, right upper quadrant mass. EXAM: CT ABDOMEN AND PELVIS WITH CONTRAST TECHNIQUE: Multidetector CT imaging of the abdomen and pelvis was performed using the standard protocol following bolus administration of intravenous contrast. RADIATION DOSE REDUCTION: This exam was performed according to the departmental dose-optimization program which includes automated exposure control, adjustment of the mA and/or kV according to patient size and/or use of iterative reconstruction technique. CONTRAST:  11m OMNIPAQUE IOHEXOL 300 MG/ML  SOLN COMPARISON:  Report only CT abdomen and pelvis 02/07/2009 FINDINGS: Lower chest: No acute abnormality. Hepatobiliary: No focal liver abnormality is seen. No gallstones, gallbladder wall thickening, or biliary dilatation. Pancreas: Unremarkable. No pancreatic ductal dilatation or  surrounding inflammatory changes. Spleen: Normal in size without focal abnormality. Adrenals/Urinary Tract: Adrenal glands are unremarkable. Kidneys are normal, without renal calculi, focal lesion, or hydronephrosis. Bladder is unremarkable. Stomach/Bowel: Stomach is within normal limits. No evidence of bowel wall thickening, distention, or inflammatory changes. The appendix is not visualized. Vascular/Lymphatic: Aortic atherosclerosis. No enlarged abdominal or pelvic lymph nodes. Reproductive: Prostate is unremarkable. Other: There is a small fat containing left inguinal hernia. There is also small fat containing umbilical hernia. No body wall mass. Musculoskeletal: Degenerative changes affect the spine. IMPRESSION: 1. No acute localizing process  in the abdomen or pelvis. 2. Small fat containing left inguinal and umbilical hernias. Aortic Atherosclerosis (ICD10-I70.0). Electronically Signed   By: Ronney Asters M.D.   On: 08/07/2022 19:18    Procedures Procedures    Medications Ordered in ED Medications  iohexol (OMNIPAQUE) 300 MG/ML solution 100 mL (100 mLs Intravenous Contrast Given 08/07/22 1855)    ED Course/ Medical Decision Making/ A&P                           Medical Decision Making Amount and/or Complexity of Data Reviewed Labs: ordered. Radiology: ordered.  Risk Prescription drug management.   This patient presents to the ED for concern of abdominal discomfort with a possible mass, this involves an extensive number of treatment options, and is a complaint that carries with it a high risk of complications and morbidity.  The differential diagnosis includes seems less likely to be cholecystitis, possible hernia, could be muscle fasciculations, unlikely to be pancreatitis   Co morbidities that complicate the patient evaluation  Hypertension   Additional history obtained:  Additional history obtained from electronic medical record External records from outside source obtained  and reviewed including prior medication list, the patient has had an upper endoscopy and a colonoscopy as recently as April of this year by Dr. Abbey Chatters, he had a polyp with a resection, nonbleeding internal hemorrhoids, upper endoscopy showed esophagitis gastric inflammation normal duodenum, candidiasis treated with Diflucan   Lab Tests:  I Ordered, and personally interpreted labs.  The pertinent results include: CBC without a leukocytosis   Imaging Studies ordered:  I ordered imaging studies including CT scan of the abdomen and pelvis I independently visualized and interpreted imaging which showed no acute findings to suggest a cause for the patient's pain or mass I agree with the radiologist interpretation   Cardiac Monitoring: / EKG:  The patient was maintained on a cardiac monitor.  I personally viewed and interpreted the cardiac monitored which showed an underlying rhythm of: Normal sinus rhythm     Problem List / ED Course / Critical interventions / Medication management  Work-up without any acute findings, patient well-appearing, vital signs unremarkable, no tachycardia hypotension or fever The patient improved and is stable for discharge I have reviewed the patients home medicines and have made adjustments as needed   Social Determinants of Health:  None   Test / Admission - Considered:  Considered surgical consultation but nothing found on work-up that would require this, stable for discharge at this time         Final Clinical Impression(s) / ED Diagnoses Final diagnoses:  Right upper quadrant abdominal pain     Noemi Chapel, MD 08/07/22 1958

## 2022-08-12 NOTE — Telephone Encounter (Signed)
Ok to schedule. ASA 3.  Needs 2 full days of clear liquids.  Need prep other than Clenpiq. Let's also have him take bisacodyl '10mg'$  daily for 3 days before his bowel prep starts.

## 2022-08-13 ENCOUNTER — Other Ambulatory Visit: Payer: Self-pay | Admitting: Gerontology

## 2022-08-13 ENCOUNTER — Other Ambulatory Visit (HOSPITAL_COMMUNITY): Payer: Self-pay | Admitting: Gerontology

## 2022-08-13 DIAGNOSIS — E785 Hyperlipidemia, unspecified: Secondary | ICD-10-CM | POA: Diagnosis not present

## 2022-08-13 DIAGNOSIS — R109 Unspecified abdominal pain: Secondary | ICD-10-CM | POA: Diagnosis not present

## 2022-08-13 DIAGNOSIS — I10 Essential (primary) hypertension: Secondary | ICD-10-CM | POA: Diagnosis not present

## 2022-08-13 DIAGNOSIS — G8191 Hemiplegia, unspecified affecting right dominant side: Secondary | ICD-10-CM | POA: Diagnosis not present

## 2022-08-14 ENCOUNTER — Encounter: Payer: Self-pay | Admitting: *Deleted

## 2022-08-14 MED ORDER — PEG 3350-KCL-NA BICARB-NACL 420 G PO SOLR
4000.0000 mL | Freq: Once | ORAL | 0 refills | Status: AC
Start: 2022-08-14 — End: 2022-08-14

## 2022-08-14 NOTE — Telephone Encounter (Signed)
Spoke with pt. Scheduled for 10/23 at 11:15am. He will come by and pick up instructions. Aware will call back with pre-op appt

## 2022-08-14 NOTE — Telephone Encounter (Signed)
Called pt and is aware of pre-op appt

## 2022-08-19 ENCOUNTER — Telehealth: Payer: Self-pay | Admitting: *Deleted

## 2022-08-19 NOTE — Telephone Encounter (Signed)
PA via [place] Reason: 1.Disposition pending review Tracking #: [id number]

## 2022-08-20 ENCOUNTER — Encounter (HOSPITAL_COMMUNITY): Payer: Self-pay

## 2022-08-20 ENCOUNTER — Encounter (HOSPITAL_COMMUNITY)
Admission: RE | Admit: 2022-08-20 | Discharge: 2022-08-20 | Disposition: A | Discharge status: Home / Self Care | Payer: 59 | Source: Ambulatory Visit | Attending: Internal Medicine | Admitting: Internal Medicine

## 2022-08-20 ENCOUNTER — Telehealth: Payer: Self-pay | Admitting: *Deleted

## 2022-08-20 HISTORY — DX: Dyspnea, unspecified: R06.00

## 2022-08-20 NOTE — Telephone Encounter (Signed)
Received call from [place] in endo. Triaged patient. She stated patient is taking Plavix '75mg'$  daily. I advised when we did triage he did not have this down as one of his medications. Procedure cancelled for now.   Called pt and made aware. He stated he did not know why he was taking plavix and must have forgotten to add this on his list.  I called [facility] and was advised patient last refill picked up was 08/12/22 but originally prescribed back in 2021. Prescription sent in by Wilfred Curtis since 03/2022.  Called PCP and was advised patient was prescribed plavix from hospital D/C 2021 so they have just been refilling it since.  Looking in chart at last neuro appt last year they advised pt to stop taking the plavix.   Magda Paganini, since you signed off on his triage last does he need OV to get rescheduled?

## 2022-08-20 NOTE — Telephone Encounter (Signed)
I have sent the last neurology note to patient PCP advising of they had said at his visit with them. The receptionist stated she would give to provider to review as he is gone for the day. I advised pt needs to be contacted and to follow up with [place] once done.

## 2022-08-21 ENCOUNTER — Ambulatory Visit (HOSPITAL_COMMUNITY)
Admission: RE | Admit: 2022-08-21 | Discharge: 2022-08-21 | Disposition: A | Discharge status: Home / Self Care | Payer: 59 | Source: Ambulatory Visit | Attending: Gerontology | Admitting: Gerontology

## 2022-08-21 DIAGNOSIS — R109 Unspecified abdominal pain: Secondary | ICD-10-CM | POA: Insufficient documentation

## 2022-08-24 ENCOUNTER — Encounter (HOSPITAL_COMMUNITY): Admission: RE | Payer: Self-pay | Source: Home / Self Care

## 2022-08-24 ENCOUNTER — Ambulatory Visit (HOSPITAL_COMMUNITY): Admission: RE | Admit: 2022-08-24 | Payer: 59 | Source: Home / Self Care

## 2022-08-24 SURGERY — COLONOSCOPY WITH PROPOFOL
Anesthesia: Monitor Anesthesia Care

## 2022-08-25 ENCOUNTER — Telehealth: Payer: Self-pay

## 2022-08-25 NOTE — Telephone Encounter (Signed)
Returned pt's call from vm on my phone. LMOVM for the pt to return call

## 2022-08-25 NOTE — Telephone Encounter (Signed)
Reviewed chart. Neurology advised plavix for 3 weeks and then ASA daily. That was at time of hospitalization for possible TIA in 2021.   I would recommend holding Plavix for 5 days and proceed with colonoscopy as per other original orders.  ASA 3.  Needs 2 full days of clear liquids.  Need prep other than Clenpiq. Let's also have him take bisacodyl '10mg'$  daily for 3 days before his bowel prep starts.

## 2022-08-25 NOTE — Telephone Encounter (Signed)
LMOVM to call back 

## 2022-08-26 ENCOUNTER — Encounter: Payer: Self-pay | Admitting: *Deleted

## 2022-08-26 ENCOUNTER — Encounter (HOSPITAL_BASED_OUTPATIENT_CLINIC_OR_DEPARTMENT_OTHER): Payer: Self-pay

## 2022-08-26 DIAGNOSIS — G4700 Insomnia, unspecified: Secondary | ICD-10-CM

## 2022-08-26 DIAGNOSIS — R5383 Other fatigue: Secondary | ICD-10-CM

## 2022-08-26 DIAGNOSIS — R0683 Snoring: Secondary | ICD-10-CM

## 2022-08-26 NOTE — Telephone Encounter (Signed)
PA approved: Ref# [id number] DOS: 08/24/22-12-31/24

## 2022-08-26 NOTE — Telephone Encounter (Addendum)
Pt called back. Scheduled for 11/13 at 1:45pm he already has prep at home. Will mail new instructions/pre-op appt. Patient states his plavix was already stopped and has not been taking it.

## 2022-08-26 NOTE — Addendum Note (Signed)
Addended by: Cheron Every on: 08/26/2022 09:14 AM   Modules accepted: Orders

## 2022-09-02 ENCOUNTER — Ambulatory Visit: Payer: 59 | Attending: Neurology | Admitting: Neurology

## 2022-09-02 DIAGNOSIS — R0683 Snoring: Secondary | ICD-10-CM | POA: Insufficient documentation

## 2022-09-02 DIAGNOSIS — G4733 Obstructive sleep apnea (adult) (pediatric): Secondary | ICD-10-CM | POA: Insufficient documentation

## 2022-09-02 DIAGNOSIS — R5383 Other fatigue: Secondary | ICD-10-CM | POA: Diagnosis not present

## 2022-09-02 DIAGNOSIS — G4700 Insomnia, unspecified: Secondary | ICD-10-CM | POA: Diagnosis not present

## 2022-09-10 ENCOUNTER — Other Ambulatory Visit: Payer: Self-pay

## 2022-09-10 ENCOUNTER — Encounter (HOSPITAL_COMMUNITY): Payer: Self-pay

## 2022-09-10 ENCOUNTER — Encounter (HOSPITAL_COMMUNITY)
Admission: RE | Admit: 2022-09-10 | Discharge: 2022-09-10 | Disposition: A | Discharge status: Home / Self Care | Payer: 59 | Source: Ambulatory Visit | Attending: Internal Medicine | Admitting: Internal Medicine

## 2022-09-10 NOTE — Procedures (Signed)
     Stoddard A. Merlene Laughter, MD     [email or web address]               HOME SLEEP STUDY  LOCATION: [facility]    Patient Name: Larry Nichols, Larry Nichols Date: 09/02/2022 Gender: Male D.O.B: 08/26/2022 Age (years): 1 Referring Provider: Barton Fanny NP Height (inches): 8 Interpreting Physician: Phillips Odor MD, ABSM Weight (lbs): 245 RPSGT: Rosebud Poles BMI: 33 MRN: [id number] Neck Size: CLINICAL INFORMATION Sleep Study Type: HST     Indication for sleep study: N/A     Epworth Sleepiness Score: N/A  SLEEP STUDY TECHNIQUE A multi-channel overnight portable sleep study was performed. The channels recorded were: nasal airflow, thoracic respiratory movement, and oxygen saturation with a pulse oximetry. Snoring was also monitored.  MEDICATIONS Patient self administered medications include: N/A.  SLEEP ARCHITECTURE Patient was studied for 302.5 minutes. The sleep efficiency was 63.0 % and the patient was supine for 0%. The arousal index was 0.0 per hour.  RESPIRATORY PARAMETERS The overall AHI was 25.4 per hour, with a central apnea index of 0 per hour.  The oxygen nadir was 77% during sleep.     CARDIAC DATA Mean heart rate during sleep was 75.9 bpm.  IMPRESSIONS Moderate obstructive sleep apnea occurred during this study (AHI = 25.4/h).   Auto PAP 8-14 is recommended.    Delano Metz, MD Diplomate, American Board of Sleep Medicine.  ELECTRONICALLY SIGNED ON:  09/10/2022, 8:55 AM [facility] PH: [phone]   FX: [phone] [place]

## 2022-09-11 DIAGNOSIS — G4733 Obstructive sleep apnea (adult) (pediatric): Secondary | ICD-10-CM | POA: Diagnosis not present

## 2022-09-11 DIAGNOSIS — M79673 Pain in unspecified foot: Secondary | ICD-10-CM | POA: Diagnosis not present

## 2022-09-11 DIAGNOSIS — M79643 Pain in unspecified hand: Secondary | ICD-10-CM | POA: Diagnosis not present

## 2022-09-11 DIAGNOSIS — Z79899 Other long term (current) drug therapy: Secondary | ICD-10-CM | POA: Diagnosis not present

## 2022-09-11 DIAGNOSIS — G609 Hereditary and idiopathic neuropathy, unspecified: Secondary | ICD-10-CM | POA: Diagnosis not present

## 2022-09-11 DIAGNOSIS — G4700 Insomnia, unspecified: Secondary | ICD-10-CM | POA: Diagnosis not present

## 2022-09-14 ENCOUNTER — Other Ambulatory Visit: Payer: Self-pay

## 2022-09-14 ENCOUNTER — Encounter (HOSPITAL_COMMUNITY)
Admission: RE | Disposition: A | Discharge status: Home / Self Care | Payer: Self-pay | Source: Home / Self Care | Attending: Internal Medicine

## 2022-09-14 ENCOUNTER — Encounter (HOSPITAL_COMMUNITY): Payer: Self-pay

## 2022-09-14 ENCOUNTER — Ambulatory Visit (HOSPITAL_COMMUNITY): Payer: 59 | Admitting: Certified Registered Nurse Anesthetist

## 2022-09-14 ENCOUNTER — Ambulatory Visit (HOSPITAL_COMMUNITY)
Admission: RE | Admit: 2022-09-14 | Discharge: 2022-09-14 | Disposition: A | Discharge status: Home / Self Care | Payer: 59 | Attending: Internal Medicine | Admitting: Internal Medicine

## 2022-09-14 ENCOUNTER — Ambulatory Visit (HOSPITAL_BASED_OUTPATIENT_CLINIC_OR_DEPARTMENT_OTHER): Payer: 59 | Admitting: Certified Registered Nurse Anesthetist

## 2022-09-14 DIAGNOSIS — F419 Anxiety disorder, unspecified: Secondary | ICD-10-CM | POA: Insufficient documentation

## 2022-09-14 DIAGNOSIS — D125 Benign neoplasm of sigmoid colon: Secondary | ICD-10-CM

## 2022-09-14 DIAGNOSIS — Z1212 Encounter for screening for malignant neoplasm of rectum: Secondary | ICD-10-CM

## 2022-09-14 DIAGNOSIS — K648 Other hemorrhoids: Secondary | ICD-10-CM | POA: Insufficient documentation

## 2022-09-14 DIAGNOSIS — I10 Essential (primary) hypertension: Secondary | ICD-10-CM | POA: Diagnosis not present

## 2022-09-14 DIAGNOSIS — D122 Benign neoplasm of ascending colon: Secondary | ICD-10-CM | POA: Diagnosis not present

## 2022-09-14 DIAGNOSIS — F17210 Nicotine dependence, cigarettes, uncomplicated: Secondary | ICD-10-CM | POA: Insufficient documentation

## 2022-09-14 DIAGNOSIS — Z8673 Personal history of transient ischemic attack (TIA), and cerebral infarction without residual deficits: Secondary | ICD-10-CM | POA: Insufficient documentation

## 2022-09-14 DIAGNOSIS — Z823 Family history of stroke: Secondary | ICD-10-CM | POA: Diagnosis not present

## 2022-09-14 DIAGNOSIS — F32A Depression, unspecified: Secondary | ICD-10-CM | POA: Diagnosis not present

## 2022-09-14 DIAGNOSIS — Z1211 Encounter for screening for malignant neoplasm of colon: Secondary | ICD-10-CM | POA: Diagnosis not present

## 2022-09-14 DIAGNOSIS — D124 Benign neoplasm of descending colon: Secondary | ICD-10-CM

## 2022-09-14 DIAGNOSIS — R69 Illness, unspecified: Secondary | ICD-10-CM | POA: Diagnosis not present

## 2022-09-14 DIAGNOSIS — Z139 Encounter for screening, unspecified: Secondary | ICD-10-CM | POA: Diagnosis not present

## 2022-09-14 DIAGNOSIS — K6389 Other specified diseases of intestine: Secondary | ICD-10-CM | POA: Diagnosis not present

## 2022-09-14 HISTORY — PX: POLYPECTOMY: SHX5525

## 2022-09-14 HISTORY — PX: COLONOSCOPY WITH PROPOFOL: SHX5780

## 2022-09-14 SURGERY — COLONOSCOPY WITH PROPOFOL
Anesthesia: General

## 2022-09-14 MED ORDER — PROPOFOL 500 MG/50ML IV EMUL
INTRAVENOUS | Status: DC | PRN
  Administered 2022-09-14: 150 ug/kg/min via INTRAVENOUS

## 2022-09-14 MED ORDER — LACTATED RINGERS IV SOLN: INTRAVENOUS | Status: DC

## 2022-09-14 MED ORDER — PHENYLEPHRINE HCL (PRESSORS) 10 MG/ML IV SOLN
INTRAVENOUS | Status: DC | PRN
  Administered 2022-09-14 (×3): 240 ug via INTRAVENOUS

## 2022-09-14 MED ORDER — PROPOFOL 10 MG/ML IV BOLUS
INTRAVENOUS | Status: DC | PRN
  Administered 2022-09-14 (×2): 30 mg via INTRAVENOUS
  Administered 2022-09-14: 100 mg via INTRAVENOUS

## 2022-09-14 MED ORDER — LIDOCAINE HCL (CARDIAC) PF 100 MG/5ML IV SOSY
PREFILLED_SYRINGE | INTRAVENOUS | Status: DC | PRN
  Administered 2022-09-14: 60 mg via INTRAVENOUS

## 2022-09-14 NOTE — Discharge Instructions (Addendum)
  Colonoscopy Discharge Instructions  Read the instructions outlined below and refer to this sheet in the next few weeks. These discharge instructions provide you with general information on caring for yourself after you leave the hospital. Your doctor may also give you specific instructions. While your treatment has been planned according to the most current medical practices available, unavoidable complications occasionally occur.   ACTIVITY You may resume your regular activity, but move at a slower pace for the next 24 hours.  Take frequent rest periods for the next 24 hours.  Walking will help get rid of the air and reduce the bloated feeling in your belly (abdomen).  No driving for 24 hours (because of the medicine (anesthesia) used during the test).   Do not sign any important legal documents or operate any machinery for 24 hours (because of the anesthesia used during the test).  NUTRITION Drink plenty of fluids.  You may resume your normal diet as instructed by your doctor.  Begin with a light meal and progress to your normal diet. Heavy or fried foods are harder to digest and may make you feel sick to your stomach (nauseated).  Avoid alcoholic beverages for 24 hours or as instructed.  MEDICATIONS You may resume your normal medications unless your doctor tells you otherwise.  WHAT YOU CAN EXPECT TODAY Some feelings of bloating in the abdomen.  Passage of more gas than usual.  Spotting of blood in your stool or on the toilet paper.  IF YOU HAD POLYPS REMOVED DURING THE COLONOSCOPY: No aspirin products for 7 days or as instructed.  No alcohol for 7 days or as instructed.  Eat a soft diet for the next 24 hours.  FINDING OUT THE RESULTS OF YOUR TEST Not all test results are available during your visit. If your test results are not back during the visit, make an appointment with your caregiver to find out the results. Do not assume everything is normal if you have not heard from your  caregiver or the medical facility. It is important for you to follow up on all of your test results.  SEEK IMMEDIATE MEDICAL ATTENTION IF: You have more than a spotting of blood in your stool.  Your belly is swollen (abdominal distention).  You are nauseated or vomiting.  You have a temperature over 101.  You have abdominal pain or discomfort that is severe or gets worse throughout the day.   Your colonoscopy revealed 7 polyp(s) which I removed successfully. Await pathology results, my office will contact you. I recommend repeating colonoscopy in 3 years for surveillance purposes. Otherwise follow up with GI as needed.     I hope you have a great rest of your week!  Charles K. Carver, D.O. Gastroenterology and Hepatology [facility]  
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
## 2022-09-14 NOTE — Op Note (Signed)
[facility] Patient Name: Larry Nichols Procedure Date: 09/14/2022 10:16 AM MRN: [id number] Date of Birth: Mar 10, 1961 Attending MD: Elon Alas. Abbey Chatters , [place], [id number] CSN: [id number] Age: 61 Admit Type: Outpatient Procedure:                Colonoscopy Indications:              Screening for colorectal malignant neoplasm Providers:                Elon Alas. Abbey Chatters, DO, Caprice Kluver, Crystal Page,                            Raphael Gibney, Technician Referring MD:              Medicines:                See the Anesthesia note for documentation of the                            administered medications Complications:            No immediate complications. Estimated Blood Loss:     Estimated blood loss was minimal. Procedure:                Pre-Anesthesia Assessment:                           - The anesthesia plan was to use monitored                            anesthesia care (MAC).                           After obtaining informed consent, the colonoscope                            was passed under direct vision. Throughout the                            procedure, the patient's blood pressure, pulse, and                            oxygen saturations were monitored continuously. The                            [id number]-HQ190L ([id number]) scope was introduced through                            the anus and advanced to the the cecum, identified                            by appendiceal orifice and ileocecal valve. The                            colonoscopy was performed without difficulty. The                            patient tolerated the procedure  well. The quality                            of the bowel preparation was evaluated using the                            BBPS [facility] Bowel Preparation Scale) with scores                            of: Right Colon = 3, Transverse Colon = 3 and Left                            Colon = 3 (entire mucosa seen well with no residual                             staining, small fragments of stool or opaque                            liquid). The total BBPS score equals 9. Scope In: 10:29:21 AM Scope Out: 10:44:48 AM Scope Withdrawal Time: 0 hours 13 minutes 46 seconds  Total Procedure Duration: 0 hours 15 minutes 27 seconds  Findings:      Non-bleeding internal hemorrhoids were found during endoscopy.      Two sessile polyps were found in the ascending colon. The polyps were 3       to 5 mm in size. These polyps were removed with a cold snare. Resection       and retrieval were complete.      Five sessile polyps were found in the sigmoid colon and descending       colon. The polyps were 4 to 7 mm in size. These polyps were removed with       a cold snare. Resection and retrieval were complete.      The exam was otherwise without abnormality. Impression:               - Non-bleeding internal hemorrhoids.                           - Two 3 to 5 mm polyps in the ascending colon,                            removed with a cold snare. Resected and retrieved.                           - Five 4 to 7 mm polyps in the sigmoid colon and in                            the descending colon, removed with a cold snare.                            Resected and retrieved.                           - The examination was otherwise normal. Moderate Sedation:  Per Anesthesia Care Recommendation:           - Patient has a contact number available for                            emergencies. The signs and symptoms of potential                            delayed complications were discussed with the                            patient. Return to normal activities tomorrow.                            Written discharge instructions were provided to the                            patient.                           - Resume previous diet.                           - Continue present medications.                           - Await pathology results.                            - Repeat colonoscopy in 3 years for surveillance.                           - Return to GI clinic PRN. Procedure Code(s):        --- Professional ---                           [phone], Colonoscopy, flexible; with removal of                            tumor(s), polyp(s), or other lesion(s) by snare                            technique Diagnosis Code(s):        --- Professional ---                           Z12.11, Encounter for screening for malignant                            neoplasm of colon                           D12.2, Benign neoplasm of ascending colon                           D12.5, Benign neoplasm of sigmoid colon  D12.4, Benign neoplasm of descending colon                           K64.8, Other hemorrhoids CPT copyright 2022 American Medical Association. All rights reserved. The codes documented in this report are preliminary and upon coder review may  be revised to meet current compliance requirements. Elon Alas. Abbey Chatters, DO [place] Abbey Chatters, DO 09/14/2022 10:48:04 AM This report has been signed electronically. Number of Addenda: 0

## 2022-09-14 NOTE — Anesthesia Postprocedure Evaluation (Signed)
Anesthesia Post Note  Patient: Larry Nichols  Procedure(s) Performed: COLONOSCOPY WITH PROPOFOL POLYPECTOMY  Patient location during evaluation: Phase II Anesthesia Type: General Level of consciousness: awake Pain management: pain level controlled Vital Signs Assessment: post-procedure vital signs reviewed and stable Respiratory status: spontaneous breathing and respiratory function stable Cardiovascular status: blood pressure returned to baseline and stable Postop Assessment: no headache and no apparent nausea or vomiting Anesthetic complications: no Comments: Late entry   No notable events documented.   Last Vitals:  Vitals:   09/14/22 0959 09/14/22 1048  BP:  (!) 94/57  Pulse: 89 (!) 108  Resp: 19 20  Temp: 36.8 C 36.7 C  SpO2: 100% 100%    Last Pain:  Vitals:   09/14/22 1048  TempSrc: Axillary  PainSc: 0-No pain                 Louann Sjogren

## 2022-09-14 NOTE — Transfer of Care (Signed)
Immediate Anesthesia Transfer of Care Note  Patient: Larry Nichols  Procedure(s) Performed: COLONOSCOPY WITH PROPOFOL POLYPECTOMY  Patient Location: Short Stay  Anesthesia Type:General  Level of Consciousness: sedated  Airway & Oxygen Therapy: Patient Spontanous Breathing  Post-op Assessment: Report given to RN and Post -op Vital signs reviewed and stable  Post vital signs: Reviewed and stable  Last Vitals:  Vitals Value Taken Time  BP 94/57 09/14/22 1048  Temp 36.7 C 09/14/22 1048  Pulse 108 09/14/22 1048  Resp 20 09/14/22 1048  SpO2 100 % 09/14/22 1048    Last Pain:  Vitals:   09/14/22 1048  TempSrc: Axillary  PainSc: 0-No pain         Complications: No notable events documented.

## 2022-09-14 NOTE — Anesthesia Preprocedure Evaluation (Signed)
Anesthesia Evaluation  Patient identified by MRN, date of birth, ID band Patient awake    Reviewed: Allergy & Precautions, H&P , NPO status , Patient's Chart, lab work & pertinent test results, reviewed documented beta blocker date and time   Airway Mallampati: II  TM Distance: >3 FB Neck ROM: full    Dental no notable dental hx.    Pulmonary shortness of breath, Current Smoker and Patient abstained from smoking.   Pulmonary exam normal breath sounds clear to auscultation       Cardiovascular Exercise Tolerance: Good hypertension, negative cardio ROS  Rhythm:regular Rate:Normal     Neuro/Psych  PSYCHIATRIC DISORDERS Anxiety Depression    TIA   GI/Hepatic negative GI ROS, Neg liver ROS,,,  Endo/Other  negative endocrine ROS    Renal/GU negative Renal ROS  negative genitourinary   Musculoskeletal   Abdominal   Peds  Hematology negative hematology ROS (+)   Anesthesia Other Findings   Reproductive/Obstetrics negative OB ROS                             Anesthesia Physical Anesthesia Plan  ASA: 3  Anesthesia Plan: General   Post-op Pain Management:    Induction:   PONV Risk Score and Plan: Propofol infusion  Airway Management Planned:   Additional Equipment:   Intra-op Plan:   Post-operative Plan:   Informed Consent: I have reviewed the patients History and Physical, chart, labs and discussed the procedure including the risks, benefits and alternatives for the proposed anesthesia with the patient or authorized representative who has indicated his/her understanding and acceptance.     Dental Advisory Given  Plan Discussed with: CRNA  Anesthesia Plan Comments:        Anesthesia Quick Evaluation

## 2022-09-15 LAB — SURGICAL PATHOLOGY

## 2022-09-21 ENCOUNTER — Encounter (HOSPITAL_COMMUNITY): Payer: Self-pay | Admitting: Internal Medicine

## 2022-09-29 DIAGNOSIS — G4733 Obstructive sleep apnea (adult) (pediatric): Secondary | ICD-10-CM | POA: Diagnosis not present

## 2022-09-29 DIAGNOSIS — R69 Illness, unspecified: Secondary | ICD-10-CM | POA: Diagnosis not present

## 2022-09-29 DIAGNOSIS — E785 Hyperlipidemia, unspecified: Secondary | ICD-10-CM | POA: Diagnosis not present

## 2022-09-29 DIAGNOSIS — M1990 Unspecified osteoarthritis, unspecified site: Secondary | ICD-10-CM | POA: Diagnosis not present

## 2022-09-29 DIAGNOSIS — F17200 Nicotine dependence, unspecified, uncomplicated: Secondary | ICD-10-CM | POA: Diagnosis not present

## 2022-09-29 DIAGNOSIS — Z0001 Encounter for general adult medical examination with abnormal findings: Secondary | ICD-10-CM | POA: Diagnosis not present

## 2022-09-29 DIAGNOSIS — G8191 Hemiplegia, unspecified affecting right dominant side: Secondary | ICD-10-CM | POA: Diagnosis not present

## 2022-09-29 DIAGNOSIS — I10 Essential (primary) hypertension: Secondary | ICD-10-CM | POA: Diagnosis not present

## 2022-09-29 DIAGNOSIS — F17210 Nicotine dependence, cigarettes, uncomplicated: Secondary | ICD-10-CM | POA: Diagnosis not present

## 2022-09-29 DIAGNOSIS — I700 Atherosclerosis of aorta: Secondary | ICD-10-CM | POA: Diagnosis not present

## 2022-10-07 DIAGNOSIS — G4733 Obstructive sleep apnea (adult) (pediatric): Secondary | ICD-10-CM | POA: Diagnosis not present

## 2022-10-21 DIAGNOSIS — G4733 Obstructive sleep apnea (adult) (pediatric): Secondary | ICD-10-CM | POA: Diagnosis not present

## 2022-10-21 DIAGNOSIS — M79643 Pain in unspecified hand: Secondary | ICD-10-CM | POA: Diagnosis not present

## 2022-10-21 DIAGNOSIS — Z79899 Other long term (current) drug therapy: Secondary | ICD-10-CM | POA: Diagnosis not present

## 2022-10-21 DIAGNOSIS — G609 Hereditary and idiopathic neuropathy, unspecified: Secondary | ICD-10-CM | POA: Diagnosis not present

## 2022-10-21 DIAGNOSIS — G4700 Insomnia, unspecified: Secondary | ICD-10-CM | POA: Diagnosis not present

## 2022-10-21 DIAGNOSIS — M79673 Pain in unspecified foot: Secondary | ICD-10-CM | POA: Diagnosis not present

## 2022-10-23 DIAGNOSIS — M545 Low back pain, unspecified: Secondary | ICD-10-CM | POA: Diagnosis not present

## 2022-10-23 DIAGNOSIS — M549 Dorsalgia, unspecified: Secondary | ICD-10-CM | POA: Diagnosis not present

## 2022-10-23 DIAGNOSIS — N200 Calculus of kidney: Secondary | ICD-10-CM | POA: Diagnosis not present

## 2022-10-23 DIAGNOSIS — I10 Essential (primary) hypertension: Secondary | ICD-10-CM | POA: Diagnosis not present

## 2022-10-24 ENCOUNTER — Emergency Department (HOSPITAL_COMMUNITY): Payer: 59

## 2022-10-24 ENCOUNTER — Encounter (HOSPITAL_COMMUNITY): Payer: Self-pay

## 2022-10-24 ENCOUNTER — Other Ambulatory Visit: Payer: Self-pay

## 2022-10-24 ENCOUNTER — Emergency Department (HOSPITAL_COMMUNITY)
Admission: EM | Admit: 2022-10-24 | Discharge: 2022-10-24 | Disposition: A | Discharge status: Home / Self Care | Payer: 59 | Attending: Emergency Medicine | Admitting: Emergency Medicine

## 2022-10-24 DIAGNOSIS — I700 Atherosclerosis of aorta: Secondary | ICD-10-CM | POA: Diagnosis not present

## 2022-10-24 DIAGNOSIS — R1032 Left lower quadrant pain: Secondary | ICD-10-CM | POA: Diagnosis not present

## 2022-10-24 DIAGNOSIS — Z7982 Long term (current) use of aspirin: Secondary | ICD-10-CM | POA: Diagnosis not present

## 2022-10-24 DIAGNOSIS — Z79899 Other long term (current) drug therapy: Secondary | ICD-10-CM | POA: Diagnosis not present

## 2022-10-24 DIAGNOSIS — I10 Essential (primary) hypertension: Secondary | ICD-10-CM | POA: Insufficient documentation

## 2022-10-24 DIAGNOSIS — R10A2 Flank pain, left side: Secondary | ICD-10-CM

## 2022-10-24 DIAGNOSIS — M1612 Unilateral primary osteoarthritis, left hip: Secondary | ICD-10-CM | POA: Diagnosis not present

## 2022-10-24 DIAGNOSIS — R109 Unspecified abdominal pain: Secondary | ICD-10-CM | POA: Insufficient documentation

## 2022-10-24 LAB — CBC WITH DIFFERENTIAL/PLATELET
Abs Immature Granulocytes: 0.03 10*3/uL (ref 0.00–0.07)
Basophils Absolute: 0.1 10*3/uL (ref 0.0–0.1)
Basophils Relative: 1 %
Eosinophils Absolute: 0.2 10*3/uL (ref 0.0–0.5)
Eosinophils Relative: 2 %
HCT: 39.6 % (ref 39.0–52.0)
Hemoglobin: 13.4 g/dL (ref 13.0–17.0)
Immature Granulocytes: 0 %
Lymphocytes Relative: 34 %
Lymphs Abs: 2.6 10*3/uL (ref 0.7–4.0)
MCH: 30.4 pg (ref 26.0–34.0)
MCHC: 33.8 g/dL (ref 30.0–36.0)
MCV: 89.8 fL (ref 80.0–100.0)
Monocytes Absolute: 0.8 10*3/uL (ref 0.1–1.0)
Monocytes Relative: 10 %
Neutro Abs: 4 10*3/uL (ref 1.7–7.7)
Neutrophils Relative %: 53 %
Platelets: 369 10*3/uL (ref 150–400)
RBC: 4.41 MIL/uL (ref 4.22–5.81)
RDW: 13.7 % (ref 11.5–15.5)
WBC: 7.7 10*3/uL (ref 4.0–10.5)
nRBC: 0 % (ref 0.0–0.2)

## 2022-10-24 LAB — COMPREHENSIVE METABOLIC PANEL
ALT: 13 U/L (ref 0–44)
AST: 26 U/L (ref 15–41)
Albumin: 4 g/dL (ref 3.5–5.0)
Alkaline Phosphatase: 61 U/L (ref 38–126)
Anion gap: 6 (ref 5–15)
BUN: 20 mg/dL (ref 8–23)
CO2: 27 mmol/L (ref 22–32)
Calcium: 9.3 mg/dL (ref 8.9–10.3)
Chloride: 105 mmol/L (ref 98–111)
Creatinine, Ser: 1.05 mg/dL (ref 0.61–1.24)
GFR, Estimated: >60 mL/min (ref >60)
Glucose, Bld: 112 mg/dL — ABNORMAL HIGH (ref 70–99)
Potassium: 3.9 mmol/L (ref 3.5–5.1)
Sodium: 138 mmol/L (ref 135–145)
Total Bilirubin: 0.5 mg/dL (ref 0.3–1.2)
Total Protein: 7 g/dL (ref 6.5–8.1)

## 2022-10-24 LAB — MAGNESIUM: Magnesium: 2.2 mg/dL (ref 1.7–2.4)

## 2022-10-24 LAB — LIPASE, BLOOD: Lipase: 28 U/L (ref 11–51)

## 2022-10-24 MED ORDER — HYDROMORPHONE HCL 1 MG/ML IJ SOLN
1.0000 mg | Freq: Once | INTRAMUSCULAR | Status: AC
  Administered 2022-10-24: 1 mg via INTRAVENOUS
  Filled 2022-10-24: qty 1

## 2022-10-24 MED ORDER — OXYCODONE-ACETAMINOPHEN 5-325 MG PO TABS: 2.0000 | ORAL_TABLET | Freq: Once | ORAL | Status: DC

## 2022-10-24 MED ORDER — IOHEXOL 300 MG/ML  SOLN
100.0000 mL | Freq: Once | INTRAMUSCULAR | Status: AC | PRN
  Administered 2022-10-24: 100 mL via INTRAVENOUS
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
## 2022-10-24 NOTE — ED Notes (Signed)
Patient transported to X-ray 

## 2022-10-24 NOTE — ED Provider Notes (Signed)
[facility] EMERGENCY DEPARTMENT Provider Note   CSN: [id number] Arrival date & time: 10/24/22  0033     History {Add pertinent medical, surgical, social history, OB history to HPI:1} Chief Complaint  Patient presents with   Flank Pain    Larry Nichols is a 61 y.o. male.  61 year old male with history of hypertension and syncope who presents ER today secondary to pain and syncope.  Patient states that he was standing in the kitchen and had acute onset of left-sided abdominal pain/flank pain and back pain.  It was pretty severe in nature and he passed out.  States he has passed out with pain before.  The pain improved after he passed out and he went lay down in the bed just to relax.  He states that he went to stand up the pain came back was really bad again he syncopized again.  His wife called EMS and brought him here for further evaluation.  Patient had not noticed any recent urinary changes.  No recent infections.  No trauma.  No other associated symptoms.   Flank Pain       Home Medications Prior to Admission medications   Medication Sig Start Date End Date Taking? Authorizing Provider  amLODipine (NORVASC) 10 MG tablet Take 1 tablet (10 mg total) by mouth daily. 08/29/20  Yes Mercy Riding, MD  aspirin EC 81 MG EC tablet Take 1 tablet (81 mg total) by mouth daily. 08/30/20  Yes Mercy Riding, MD  DULoxetine (CYMBALTA) 60 MG capsule Take 1 capsule (60 mg total) by mouth daily. Patient taking differently: Take 60 mg by mouth 2 (two) times daily. 04/30/21  Yes McCue, Janett Billow, NP  gabapentin (NEURONTIN) 300 MG capsule Take 2 capsules (600 mg total) by mouth 3 (three) times daily. Patient taking differently: Take 600 mg by mouth 2 (two) times daily. 05/15/21  Yes McCue, Janett Billow, NP  gabapentin (NEURONTIN) 600 MG tablet Take 600 mg by mouth 2 (two) times daily. 10/19/22  Yes [provider]  hydrALAZINE (APRESOLINE) 100 MG tablet Take 100 mg by mouth 2 (two) times daily.  09/09/21  Yes [provider]  hydrALAZINE (APRESOLINE) 50 MG tablet Take 50 mg by mouth 2 (two) times daily. 10/19/22  Yes [provider]  losartan-hydrochlorothiazide (HYZAAR) 100-12.5 MG tablet Take 1 tablet by mouth in the morning. 03/06/21  Yes [provider]  polyethylene glycol-electrolytes (NULYTELY) 420 g solution Take by mouth once. 08/14/22  Yes [provider]  albuterol (VENTOLIN HFA) 108 (90 Base) MCG/ACT inhaler Inhale 1-2 puffs into the lungs every 6 (six) hours as needed for wheezing or shortness of breath.    [provider]  hydrOXYzine (VISTARIL) 25 MG capsule Take 25 mg by mouth 3 (three) times daily as needed for anxiety. 12/31/20   [provider]  losartan-hydrochlorothiazide (HYZAAR) 50-12.5 MG tablet Take 1 tablet by mouth at bedtime.    [provider]      Allergies    Patient has no known allergies.    Review of Systems   Review of Systems  Genitourinary:  Positive for flank pain.    Physical Exam Updated Vital Signs BP 119/86   Pulse 72   Temp 98.2 F (36.8 C) (Oral)   Resp 16   Ht 5' 11.5" (1.816 m)   Wt 106.6 kg   SpO2 98%   BMI 32.32 kg/m  Physical Exam Vitals and nursing note reviewed.  Constitutional:      Appearance: He  is well-developed.  HENT:     Head: Normocephalic and atraumatic.     Mouth/Throat:     Mouth: Mucous membranes are moist.     Pharynx: Oropharynx is clear.  Eyes:     Pupils: Pupils are equal, round, and reactive to light.  Cardiovascular:     Rate and Rhythm: Normal rate.  Pulmonary:     Effort: Pulmonary effort is normal. No respiratory distress.  Abdominal:     General: Abdomen is flat. There is no distension.  Musculoskeletal:        General: Normal range of motion.     Cervical back: Normal range of motion.     Comments: L flank/CVA ttp  Skin:    General: Skin is warm and dry.  Neurological:     General: No focal deficit present.     Mental  Status: He is alert.     ED Results / Procedures / Treatments   Labs (all labs ordered are listed, but only abnormal results are displayed) Labs Reviewed  COMPREHENSIVE METABOLIC PANEL - Abnormal; Notable for the following components:      Result Value   Glucose, Bld 112 (*)    All other components within normal limits  CBC WITH DIFFERENTIAL/PLATELET  LIPASE, BLOOD  URINALYSIS, ROUTINE W REFLEX MICROSCOPIC  MAGNESIUM    EKG None  Radiology DG Hip Unilat W or Wo Pelvis 2-3 Views Left  Result Date: 10/24/2022 CLINICAL DATA:  Pain to left lower flank/groin/hip after fall and loss of consciousness tonight EXAM: DG HIP (WITH OR WITHOUT PELVIS) 2-3V LEFT COMPARISON:  CT abdomen and pelvis 08/07/2022 FINDINGS: There is no evidence of hip fracture or dislocation. Mild degenerative arthritis both hips. IMPRESSION: No acute fracture or dislocation. Electronically Signed   By: Placido Sou M.D.   On: 10/24/2022 01:48    Procedures Procedures  {Document cardiac monitor, telemetry assessment procedure when appropriate:1}  Medications Ordered in ED Medications  HYDROmorphone (DILAUDID) injection 1 mg (has no administration in time range)    ED Course/ Medical Decision Making/ A&P                           Medical Decision Making Amount and/or Complexity of Data Reviewed Labs: ordered. Radiology: ordered. ECG/medicine tests: ordered.  Risk Prescription drug management.  Consider diverticulitis versus kidney stone versus less likely bowel obstruction, bowel perforation.  Will give pain medications, labs and get CT scan to evaluate for same.  Reevaluate after results and medications for disposition. ***  {Document critical care time when appropriate:1} {Document review of labs and clinical decision tools ie heart score, Chads2Vasc2 etc:1}  {Document your independent review of radiology images, and any outside records:1} {Document your discussion with family members,  caretakers, and with consultants:1} {Document social determinants of health affecting pt's care:1} {Document your decision making why or why not admission, treatments were needed:1} Final Clinical Impression(s) / ED Diagnoses Final diagnoses:  None    Rx / DC Orders ED Discharge Orders     None

## 2022-11-07 DIAGNOSIS — G4733 Obstructive sleep apnea (adult) (pediatric): Secondary | ICD-10-CM | POA: Diagnosis not present

## 2023-04-14 ENCOUNTER — Encounter (HOSPITAL_COMMUNITY): Payer: Self-pay | Admitting: *Deleted

## 2023-04-14 ENCOUNTER — Emergency Department (HOSPITAL_COMMUNITY)
Admission: EM | Admit: 2023-04-14 | Discharge: 2023-04-14 | Disposition: A | Discharge status: Home / Self Care | Payer: 59 | Attending: Emergency Medicine | Admitting: Emergency Medicine

## 2023-04-14 ENCOUNTER — Other Ambulatory Visit: Payer: Self-pay

## 2023-04-14 ENCOUNTER — Emergency Department (HOSPITAL_COMMUNITY): Payer: 59

## 2023-04-14 DIAGNOSIS — R55 Syncope and collapse: Secondary | ICD-10-CM | POA: Insufficient documentation

## 2023-04-14 DIAGNOSIS — Z8673 Personal history of transient ischemic attack (TIA), and cerebral infarction without residual deficits: Secondary | ICD-10-CM | POA: Insufficient documentation

## 2023-04-14 DIAGNOSIS — R0602 Shortness of breath: Secondary | ICD-10-CM | POA: Diagnosis not present

## 2023-04-14 DIAGNOSIS — Z7982 Long term (current) use of aspirin: Secondary | ICD-10-CM | POA: Diagnosis not present

## 2023-04-14 DIAGNOSIS — R531 Weakness: Secondary | ICD-10-CM | POA: Diagnosis present

## 2023-04-14 DIAGNOSIS — E871 Hypo-osmolality and hyponatremia: Secondary | ICD-10-CM | POA: Diagnosis not present

## 2023-04-14 DIAGNOSIS — Z79899 Other long term (current) drug therapy: Secondary | ICD-10-CM | POA: Diagnosis not present

## 2023-04-14 DIAGNOSIS — Z1152 Encounter for screening for COVID-19: Secondary | ICD-10-CM | POA: Insufficient documentation

## 2023-04-14 DIAGNOSIS — N179 Acute kidney failure, unspecified: Secondary | ICD-10-CM | POA: Diagnosis not present

## 2023-04-14 DIAGNOSIS — I10 Essential (primary) hypertension: Secondary | ICD-10-CM | POA: Diagnosis not present

## 2023-04-14 LAB — COMPREHENSIVE METABOLIC PANEL
ALT: 20 U/L (ref 0–44)
AST: 31 U/L (ref 15–41)
Albumin: 4.2 g/dL (ref 3.5–5.0)
Alkaline Phosphatase: 86 U/L (ref 38–126)
Anion gap: 9 (ref 5–15)
BUN: 20 mg/dL (ref 8–23)
CO2: 24 mmol/L (ref 22–32)
Calcium: 9.3 mg/dL (ref 8.9–10.3)
Chloride: 100 mmol/L (ref 98–111)
Creatinine, Ser: 2.06 mg/dL — ABNORMAL HIGH (ref 0.61–1.24)
GFR, Estimated: 36 mL/min — ABNORMAL LOW (ref >60)
Glucose, Bld: 163 mg/dL — ABNORMAL HIGH (ref 70–99)
Potassium: 3.3 mmol/L — ABNORMAL LOW (ref 3.5–5.1)
Sodium: 133 mmol/L — ABNORMAL LOW (ref 135–145)
Total Bilirubin: 0.6 mg/dL (ref 0.3–1.2)
Total Protein: 7.4 g/dL (ref 6.5–8.1)

## 2023-04-14 LAB — CBC
HCT: 43.7 % (ref 39.0–52.0)
Hemoglobin: 14.8 g/dL (ref 13.0–17.0)
MCH: 30.4 pg (ref 26.0–34.0)
MCHC: 33.9 g/dL (ref 30.0–36.0)
MCV: 89.7 fL (ref 80.0–100.0)
Platelets: 346 10*3/uL (ref 150–400)
RBC: 4.87 MIL/uL (ref 4.22–5.81)
RDW: 13.6 % (ref 11.5–15.5)
WBC: 6.6 10*3/uL (ref 4.0–10.5)
nRBC: 0 % (ref 0.0–0.2)

## 2023-04-14 LAB — RAPID URINE DRUG SCREEN, HOSP PERFORMED
Amphetamines: NOT DETECTED
Barbiturates: NOT DETECTED
Benzodiazepines: NOT DETECTED
Cocaine: NOT DETECTED
Opiates: NOT DETECTED
Tetrahydrocannabinol: NOT DETECTED

## 2023-04-14 LAB — URINALYSIS, ROUTINE W REFLEX MICROSCOPIC
Bacteria, UA: NONE SEEN
Bilirubin Urine: NEGATIVE
Glucose, UA: NEGATIVE mg/dL
Ketones, ur: NEGATIVE mg/dL
Leukocytes,Ua: NEGATIVE
Nitrite: NEGATIVE
Protein, ur: NEGATIVE mg/dL
Specific Gravity, Urine: 1.01 (ref 1.005–1.030)
pH: 6 (ref 5.0–8.0)

## 2023-04-14 LAB — D-DIMER, QUANTITATIVE: D-Dimer, Quant: 0.37 ug/mL-FEU (ref 0.00–0.50)

## 2023-04-14 LAB — TROPONIN I (HIGH SENSITIVITY)
Troponin I (High Sensitivity): 4 ng/L (ref <18)
Troponin I (High Sensitivity): 3 ng/L (ref <18)

## 2023-04-14 LAB — ETHANOL: Alcohol, Ethyl (B): <10 mg/dL (ref <10)

## 2023-04-14 LAB — RESP PANEL BY RT-PCR (RSV, FLU A&B, COVID)  RVPGX2
Influenza A by PCR: NEGATIVE
Influenza B by PCR: NEGATIVE
Resp Syncytial Virus by PCR: NEGATIVE
SARS Coronavirus 2 by RT PCR: NEGATIVE

## 2023-04-14 MED ORDER — SODIUM CHLORIDE 0.9 % IV BOLUS
1000.0000 mL | Freq: Once | INTRAVENOUS | Status: AC
  Administered 2023-04-14: 1000 mL via INTRAVENOUS

## 2023-04-14 MED ORDER — DIPHENHYDRAMINE HCL 50 MG/ML IJ SOLN
25.0000 mg | Freq: Once | INTRAMUSCULAR | Status: AC
  Administered 2023-04-14: 25 mg via INTRAVENOUS
  Filled 2023-04-14: qty 1

## 2023-04-14 MED ORDER — PROCHLORPERAZINE EDISYLATE 10 MG/2ML IJ SOLN
10.0000 mg | Freq: Once | INTRAMUSCULAR | Status: AC
  Administered 2023-04-14: 10 mg via INTRAVENOUS
  Filled 2023-04-14: qty 2

## 2023-04-14 MED ORDER — MORPHINE SULFATE (PF) 4 MG/ML IV SOLN
4.0000 mg | Freq: Once | INTRAVENOUS | Status: AC
  Administered 2023-04-14: 4 mg via INTRAVENOUS
  Filled 2023-04-14: qty 1

## 2023-04-14 MED ORDER — ACETAMINOPHEN 325 MG PO TABS
650.0000 mg | ORAL_TABLET | Freq: Once | ORAL | Status: AC
  Administered 2023-04-14: 650 mg via ORAL
  Filled 2023-04-14: qty 2

## 2023-04-14 MED ORDER — KETOROLAC TROMETHAMINE 15 MG/ML IJ SOLN
15.0000 mg | Freq: Once | INTRAMUSCULAR | Status: AC
  Administered 2023-04-14: 15 mg via INTRAVENOUS
  Filled 2023-04-14: qty 1

## 2023-04-14 MED ORDER — MAGNESIUM SULFATE 2 GM/50ML IV SOLN
2.0000 g | Freq: Once | INTRAVENOUS | Status: AC
  Administered 2023-04-14: 2 g via INTRAVENOUS
  Filled 2023-04-14: qty 50

## 2023-04-14 NOTE — ED Notes (Signed)
PA aware of BP and headache worsening

## 2023-04-14 NOTE — Discharge Instructions (Signed)
Your creatinine was significantly elevated today, we gave you a lot of fluids in the emergency department, I recommend that you continue to drink plenty of fluids at home, and please follow-up with your primary care doctor within the next 1 to 2 weeks to recheck your kidney function.  If you continue to have lightheadedness episodes, new or worsening numbness please return to the emergency department for further evaluation.

## 2023-04-14 NOTE — ED Triage Notes (Signed)
Pt c/o worsening right arm and leg numbness and pain x 3 days. Pt had hx of a stroke with deficits on the right side.

## 2023-04-14 NOTE — ED Provider Notes (Signed)
[place] EMERGENCY DEPARTMENT AT [facility] Provider Note   CSN: [id number] Arrival date & time: 04/14/23  4098     History  Chief Complaint  Patient presents with   Numbness    Larry Nichols is a 62 y.o. male with past medical history significant for previous stroke, hypertension, previous syncopal episodes, dysphagia, anxiety, depression who presents with concern for right arm and leg numbness, pain, generalized weakness for 3 days.  He endorses some shortness of breath, denies any chest pain.  He had some right-sided deficits from previous stroke.  He takes aspirin, does not take a blood thinner.  He reports that the symptoms have been ongoing for 2 weeks but worse over the last 3 days.  He reports this morning he had a near syncopal episode, was standing in his kitchen and felt dizzy or like he was going to pass out.  He denies any alcohol, drug use.  HPI     Home Medications Prior to Admission medications   Medication Sig Start Date End Date Taking? Authorizing Provider  albuterol (VENTOLIN HFA) 108 (90 Base) MCG/ACT inhaler Inhale 1-2 puffs into the lungs every 6 (six) hours as needed for wheezing or shortness of breath.    [provider]  amLODipine (NORVASC) 10 MG tablet Take 1 tablet (10 mg total) by mouth daily. 08/29/20   Almon Hercules, MD  aspirin EC 81 MG EC tablet Take 1 tablet (81 mg total) by mouth daily. 08/30/20   Almon Hercules, MD  DULoxetine (CYMBALTA) 60 MG capsule Take 1 capsule (60 mg total) by mouth daily. Patient taking differently: Take 60 mg by mouth 2 (two) times daily. 04/30/21   Ihor Austin, NP  gabapentin (NEURONTIN) 300 MG capsule Take 2 capsules (600 mg total) by mouth 3 (three) times daily. Patient taking differently: Take 600 mg by mouth 2 (two) times daily. 05/15/21   Ihor Austin, NP  gabapentin (NEURONTIN) 600 MG tablet Take 600 mg by mouth 2 (two) times daily. 10/19/22   [provider]  hydrALAZINE  (APRESOLINE) 100 MG tablet Take 100 mg by mouth 2 (two) times daily. 09/09/21   [provider]  hydrALAZINE (APRESOLINE) 50 MG tablet Take 50 mg by mouth 2 (two) times daily. 10/19/22   [provider]  hydrOXYzine (VISTARIL) 25 MG capsule Take 25 mg by mouth 3 (three) times daily as needed for anxiety. 12/31/20   [provider]  losartan-hydrochlorothiazide (HYZAAR) 100-12.5 MG tablet Take 1 tablet by mouth in the morning. 03/06/21   [provider]  losartan-hydrochlorothiazide (HYZAAR) 50-12.5 MG tablet Take 1 tablet by mouth at bedtime.    [provider]  polyethylene glycol-electrolytes (NULYTELY) 420 g solution Take by mouth once. 08/14/22   [provider]      Allergies    Patient has no known allergies.    Review of Systems   Review of Systems  All other systems reviewed and are negative.   Physical Exam Updated Vital Signs BP 135/72   Pulse 78   Temp 98 F (36.7 C)   Resp 20   Ht 5' 11.5" (1.816 m)   Wt 108.4 kg   SpO2 92%   BMI 32.87 kg/m  Physical Exam Vitals and nursing note reviewed.  Constitutional:      General: He is not in acute distress.    Appearance: Normal appearance. He is ill-appearing.     Comments: Patient with significant diaphoresis versus unclear reason for wet skin  and shirt on the right side of the body specifically on right chest wall and back.  He also has some yellowish material around mouth and shirt, but denies any vomiting today.  HENT:     Head: Normocephalic and atraumatic.  Eyes:     General:        Right eye: No discharge.        Left eye: No discharge.  Cardiovascular:     Rate and Rhythm: Normal rate and regular rhythm.     Heart sounds: No murmur heard.    No friction rub. No gallop.  Pulmonary:     Effort: Pulmonary effort is normal.     Breath sounds: Normal breath sounds.  Abdominal:     General: Bowel sounds are normal.     Palpations: Abdomen is soft.  Skin:     General: Skin is warm and dry.     Capillary Refill: Capillary refill takes less than 2 seconds.  Neurological:     Mental Status: He is alert and oriented to person, place, and time.     Comments: Cranial nerves II through XII grossly intact.  Intact finger-nose, intact heel-to-shin.  Romberg deferred at this time secondary to weakness.  Alert and oriented x3.  Moves all 4 limbs spontaneously, normal coordination.  No pronator drift.  Intact strength 5/5 bilateral upper extremities, perhaps 4/5 strength of lower extremities, generally weak throughout.     Psychiatric:        Mood and Affect: Mood normal.        Behavior: Behavior normal.     ED Results / Procedures / Treatments   Labs (all labs ordered are listed, but only abnormal results are displayed) Labs Reviewed  URINALYSIS, ROUTINE W REFLEX MICROSCOPIC - Abnormal; Notable for the following components:      Result Value   Hgb urine dipstick SMALL (*)    All other components within normal limits  COMPREHENSIVE METABOLIC PANEL - Abnormal; Notable for the following components:   Sodium 133 (*)    Potassium 3.3 (*)    Glucose, Bld 163 (*)    Creatinine, Ser 2.06 (*)    GFR, Estimated 36 (*)    All other components within normal limits  RESP PANEL BY RT-PCR (RSV, FLU A&B, COVID)  RVPGX2  CBC  ETHANOL  RAPID URINE DRUG SCREEN, HOSP PERFORMED  D-DIMER, QUANTITATIVE  TROPONIN I (HIGH SENSITIVITY)  TROPONIN I (HIGH SENSITIVITY)    EKG EKG Interpretation  Date/Time:  Wednesday April 14 2023 10:54:38 EDT Ventricular Rate:  67 PR Interval:  188 QRS Duration: 114 QT Interval:  405 QTC Calculation: 428 R Axis:   52 Text Interpretation: Sinus rhythm Borderline intraventricular conduction delay Confirmed by Vonita Moss [phone]) on 04/14/2023 11:08:21 AM  Radiology CT Head Wo Contrast  Result Date: 04/14/2023 CLINICAL DATA:  Neuro deficit, acute, stroke suspected. EXAM: CT HEAD WITHOUT CONTRAST TECHNIQUE: Contiguous axial  images were obtained from the base of the skull through the vertex without intravenous contrast. RADIATION DOSE REDUCTION: This exam was performed according to the departmental dose-optimization program which includes automated exposure control, adjustment of the mA and/or kV according to patient size and/or use of iterative reconstruction technique. COMPARISON:  Head CT 08/28/2020.  MRI brain 08/28/2020. FINDINGS: Brain: No acute intracranial hemorrhage. Gray-white differentiation is preserved. No hydrocephalus or extra-axial collection. No mass effect or midline shift. Vascular: No hyperdense vessel or unexpected calcification. Skull: No calvarial fracture or suspicious bone lesion. Skull base is unremarkable. Sinuses/Orbits:  Trace fluid in the right maxillary and bilateral sphenoid sinuses. Orbits are unremarkable. Other: None. IMPRESSION: No acute intracranial abnormality. Electronically Signed   By: Orvan Falconer M.D.   On: 04/14/2023 12:35   DG Chest 2 View  Result Date: 04/14/2023 CLINICAL DATA:  Chest pain EXAM: CHEST - 2 VIEW COMPARISON:  None Available. FINDINGS: No consolidation, pneumothorax or effusion. No edema. Normal cardiopericardial silhouette. Degenerative changes seen of the spine on lateral view. Overlapping cardiac leads. IMPRESSION: No acute cardiopulmonary disease. Electronically Signed   By: Karen Kays M.D.   On: 04/14/2023 12:10    Procedures Procedures    Medications Ordered in ED Medications  sodium chloride 0.9 % bolus 1,000 mL (0 mLs Intravenous Stopped 04/14/23 1329)  acetaminophen (TYLENOL) tablet 650 mg (650 mg Oral Given 04/14/23 1210)  ketorolac (TORADOL) 15 MG/ML injection 15 mg (15 mg Intravenous Given 04/14/23 1616)  prochlorperazine (COMPAZINE) injection 10 mg (10 mg Intravenous Given 04/14/23 1615)  diphenhydrAMINE (BENADRYL) injection 25 mg (25 mg Intravenous Given 04/14/23 1615)  magnesium sulfate IVPB 2 g 50 mL (0 g Intravenous Stopped 04/14/23 1800)  sodium  chloride 0.9 % bolus 1,000 mL (0 mLs Intravenous Stopped 04/14/23 1841)  morphine (PF) 4 MG/ML injection 4 mg (4 mg Intravenous Given 04/14/23 1727)    ED Course/ Medical Decision Making/ A&P Clinical Course as of 04/14/23 1920  Wed Apr 14, 2023  1038 62- hx of stroke in 2021. Hx of right sided deficits. Endorses generalized weakness since then. Endorses numbness and tingling over his entire body. Endorses more frequent falls, felt dizzy, nausea, vomiting yesterday. Hx of hypertension, tobacco abuse. Normal echo in 2021. [CP]    Clinical Course User Index [CP] Olene Floss, PA-C                             Medical Decision Making Amount and/or Complexity of Data Reviewed Labs: ordered. Radiology: ordered.  Risk OTC drugs. Prescription drug management.   This patient is a 62 y.o. male  who presents to the ED for concern of several complaints including possible worsening numbness from recent stroke not acutely, over the last several weeks, as well as near syncopal episode with some vomiting yesterday, more frequent falls over the last few weeks, some dizziness and nausea today.   Differential diagnoses prior to evaluation: The emergent differential diagnosis includes, but is not limited to,  CVA, spinal cord injury, ACS, arrhythmia, syncope, orthostatic hypotension, sepsis, hypoglycemia, hypoxia, electrolyte disturbance, endocrine disorder, anemia, environmental exposure, polypharmacy, considered cardiogenic syncope, neurogenic syncope, acute dehydration, versus other. This is not an exhaustive differential.   Past Medical History / Co-morbidities / Social History: stroke, hypertension, previous syncopal episodes, dysphagia, anxiety, depression  Additional history: Chart reviewed. Pertinent results include: Reviewed echo from 2021, as well as lab work, imaging from previous emergency department visits.  Physical Exam: Physical exam performed. The pertinent findings include:  No focal neurologic deficits on my exam and patient is able to ambulate prior to discharge.  On arrival his exam was somewhat concerning with some diaphoresis for unclear reason, although he denied any syncopal episode, denied chest pain, denied nausea, vomiting.  Initially with some global lower extremity weakness but he was able to ambulate without significant difficulty on repeat evaluation.  Lab Tests/Imaging studies: I personally interpreted labs/imaging and the pertinent results include:  CMP with mild hyponatremia, sodium 133, testing 3.3.  Patient with significant AKI, creatinine 2.0 with baseline  around 1.1 previously in the past.  He has a normal D-dimer, negative ethanol, normal troponin, RVP is negative for COVID, flu, RSV, urinalysis unremarkable, CBC unremarkable, UDS negative..  Independently interpreted CT head without contrast, plain film chest x-ray which shows no evidence of acute intrathoracic or intracranial abnormality.  I agree with the radiologist interpretation.  Cardiac monitoring: EKG obtained and interpreted by my attending physician which shows: Normal sinus rhythm with borderline intraventricular conduction delay.   Medications: I ordered medication including fluid bolus, migraine cocktail for headache, Tylenol for pain, patient with improvement of headache, and able to ambulate without difficulty after above.  I have reviewed the patients home medicines and have made adjustments as needed.   Disposition: After consideration of the diagnostic results and the patients response to treatment, I feel that overall patient with unclear reason for near syncope on arrival, he does have an AKI in the emergency department with significant creatinine increase, BUN not significantly increased, patient received 2 L fluid bolus and we had a protracted discussion around inpatient hospital management for AKI and near syncope versus discharge with close PCP follow-up.  Patient opts to be  discharged at this time, understands the risks of leaving including decompensation, worsening kidney function, and opts to leave at this time, I think that this is a reasonable plan as he has close follow-up.   emergency department workup does not suggest an emergent condition requiring admission or immediate intervention beyond what has been performed at this time. The plan is: as above. The patient is safe for discharge and has been instructed to return immediately for worsening symptoms, change in symptoms or any other concerns.  Final Clinical Impression(s) / ED Diagnoses Final diagnoses:  Near syncope  AKI (acute kidney injury) [facility])    Rx / DC Orders ED Discharge Orders     None         [place] 04/14/23 1920    Rondel Baton, MD 04/15/23 1218

## 2023-04-14 NOTE — ED Notes (Signed)
MD in room

## 2023-04-14 NOTE — ED Notes (Signed)
Oxygen removed at this time. Will reassess.

## 2023-07-09 DIAGNOSIS — G4733 Obstructive sleep apnea (adult) (pediatric): Secondary | ICD-10-CM | POA: Diagnosis not present

## 2023-07-21 DIAGNOSIS — I10 Essential (primary) hypertension: Secondary | ICD-10-CM | POA: Diagnosis not present

## 2023-07-21 DIAGNOSIS — E785 Hyperlipidemia, unspecified: Secondary | ICD-10-CM | POA: Diagnosis not present

## 2023-08-06 DIAGNOSIS — E785 Hyperlipidemia, unspecified: Secondary | ICD-10-CM | POA: Diagnosis not present

## 2023-08-06 DIAGNOSIS — I10 Essential (primary) hypertension: Secondary | ICD-10-CM | POA: Diagnosis not present

## 2023-08-08 DIAGNOSIS — G4733 Obstructive sleep apnea (adult) (pediatric): Secondary | ICD-10-CM | POA: Diagnosis not present

## 2023-09-07 DIAGNOSIS — M792 Neuralgia and neuritis, unspecified: Secondary | ICD-10-CM | POA: Diagnosis not present

## 2023-09-07 DIAGNOSIS — M1990 Unspecified osteoarthritis, unspecified site: Secondary | ICD-10-CM | POA: Diagnosis not present

## 2023-09-07 DIAGNOSIS — I10 Essential (primary) hypertension: Secondary | ICD-10-CM | POA: Diagnosis not present

## 2023-09-07 DIAGNOSIS — E785 Hyperlipidemia, unspecified: Secondary | ICD-10-CM | POA: Diagnosis not present

## 2023-09-08 DIAGNOSIS — G4733 Obstructive sleep apnea (adult) (pediatric): Secondary | ICD-10-CM | POA: Diagnosis not present

## 2023-10-07 DIAGNOSIS — E785 Hyperlipidemia, unspecified: Secondary | ICD-10-CM | POA: Diagnosis not present

## 2023-10-07 DIAGNOSIS — I10 Essential (primary) hypertension: Secondary | ICD-10-CM | POA: Diagnosis not present

## 2023-10-08 DIAGNOSIS — G4733 Obstructive sleep apnea (adult) (pediatric): Secondary | ICD-10-CM | POA: Diagnosis not present

## 2023-11-07 DIAGNOSIS — E785 Hyperlipidemia, unspecified: Secondary | ICD-10-CM | POA: Diagnosis not present

## 2023-11-07 DIAGNOSIS — I10 Essential (primary) hypertension: Secondary | ICD-10-CM | POA: Diagnosis not present

## 2023-11-08 DIAGNOSIS — G4733 Obstructive sleep apnea (adult) (pediatric): Secondary | ICD-10-CM | POA: Diagnosis not present

## 2023-12-08 DIAGNOSIS — E785 Hyperlipidemia, unspecified: Secondary | ICD-10-CM | POA: Diagnosis not present

## 2023-12-08 DIAGNOSIS — I10 Essential (primary) hypertension: Secondary | ICD-10-CM | POA: Diagnosis not present

## 2023-12-24 DIAGNOSIS — G4733 Obstructive sleep apnea (adult) (pediatric): Secondary | ICD-10-CM | POA: Diagnosis not present

## 2024-01-05 DIAGNOSIS — I10 Essential (primary) hypertension: Secondary | ICD-10-CM | POA: Diagnosis not present

## 2024-01-05 DIAGNOSIS — E785 Hyperlipidemia, unspecified: Secondary | ICD-10-CM | POA: Diagnosis not present

## 2024-02-05 DIAGNOSIS — I10 Essential (primary) hypertension: Secondary | ICD-10-CM | POA: Diagnosis not present

## 2024-02-05 DIAGNOSIS — E785 Hyperlipidemia, unspecified: Secondary | ICD-10-CM | POA: Diagnosis not present

## 2024-03-07 DIAGNOSIS — M792 Neuralgia and neuritis, unspecified: Secondary | ICD-10-CM | POA: Diagnosis not present

## 2024-03-07 DIAGNOSIS — Z1389 Encounter for screening for other disorder: Secondary | ICD-10-CM | POA: Diagnosis not present

## 2024-03-07 DIAGNOSIS — R932 Abnormal findings on diagnostic imaging of liver and biliary tract: Secondary | ICD-10-CM | POA: Diagnosis not present

## 2024-03-07 DIAGNOSIS — G8191 Hemiplegia, unspecified affecting right dominant side: Secondary | ICD-10-CM | POA: Diagnosis not present

## 2024-03-07 DIAGNOSIS — I700 Atherosclerosis of aorta: Secondary | ICD-10-CM | POA: Diagnosis not present

## 2024-03-07 DIAGNOSIS — F17210 Nicotine dependence, cigarettes, uncomplicated: Secondary | ICD-10-CM | POA: Diagnosis not present

## 2024-03-07 DIAGNOSIS — G4733 Obstructive sleep apnea (adult) (pediatric): Secondary | ICD-10-CM | POA: Diagnosis not present

## 2024-03-07 DIAGNOSIS — I10 Essential (primary) hypertension: Secondary | ICD-10-CM | POA: Diagnosis not present

## 2024-03-07 DIAGNOSIS — Z0001 Encounter for general adult medical examination with abnormal findings: Secondary | ICD-10-CM | POA: Diagnosis not present

## 2024-03-08 DIAGNOSIS — I10 Essential (primary) hypertension: Secondary | ICD-10-CM | POA: Diagnosis not present

## 2024-03-08 DIAGNOSIS — R932 Abnormal findings on diagnostic imaging of liver and biliary tract: Secondary | ICD-10-CM | POA: Diagnosis not present

## 2024-03-08 DIAGNOSIS — Z0001 Encounter for general adult medical examination with abnormal findings: Secondary | ICD-10-CM | POA: Diagnosis not present

## 2024-03-08 DIAGNOSIS — E785 Hyperlipidemia, unspecified: Secondary | ICD-10-CM | POA: Diagnosis not present

## 2024-03-08 DIAGNOSIS — Z1389 Encounter for screening for other disorder: Secondary | ICD-10-CM | POA: Diagnosis not present

## 2024-03-08 DIAGNOSIS — Z1159 Encounter for screening for other viral diseases: Secondary | ICD-10-CM | POA: Diagnosis not present

## 2024-03-08 DIAGNOSIS — M792 Neuralgia and neuritis, unspecified: Secondary | ICD-10-CM | POA: Diagnosis not present

## 2024-03-15 ENCOUNTER — Other Ambulatory Visit (HOSPITAL_COMMUNITY): Payer: Self-pay | Admitting: Gerontology

## 2024-03-15 DIAGNOSIS — Z87891 Personal history of nicotine dependence: Secondary | ICD-10-CM

## 2024-04-07 DIAGNOSIS — I10 Essential (primary) hypertension: Secondary | ICD-10-CM | POA: Diagnosis not present

## 2024-04-07 DIAGNOSIS — E785 Hyperlipidemia, unspecified: Secondary | ICD-10-CM | POA: Diagnosis not present

## 2024-05-07 DIAGNOSIS — I10 Essential (primary) hypertension: Secondary | ICD-10-CM | POA: Diagnosis not present

## 2024-05-07 DIAGNOSIS — E785 Hyperlipidemia, unspecified: Secondary | ICD-10-CM | POA: Diagnosis not present

## 2024-05-15 ENCOUNTER — Encounter (HOSPITAL_COMMUNITY): Payer: Self-pay

## 2024-05-15 ENCOUNTER — Emergency Department (HOSPITAL_COMMUNITY)
Admission: EM | Admit: 2024-05-15 | Discharge: 2024-05-15 | Disposition: A | Discharge status: Home / Self Care | Attending: Emergency Medicine | Admitting: Emergency Medicine

## 2024-05-15 ENCOUNTER — Other Ambulatory Visit: Payer: Self-pay

## 2024-05-15 DIAGNOSIS — K047 Periapical abscess without sinus: Secondary | ICD-10-CM | POA: Insufficient documentation

## 2024-05-15 DIAGNOSIS — Z7982 Long term (current) use of aspirin: Secondary | ICD-10-CM | POA: Insufficient documentation

## 2024-05-15 DIAGNOSIS — K0889 Other specified disorders of teeth and supporting structures: Secondary | ICD-10-CM | POA: Diagnosis present

## 2024-05-15 MED ORDER — AMOXICILLIN 500 MG PO CAPS: 500.0000 mg | ORAL_CAPSULE | Freq: Three times a day (TID) | ORAL | 0 refills | Status: AC

## 2024-05-15 MED ORDER — NAPROXEN 500 MG PO TABS: 500.0000 mg | ORAL_TABLET | Freq: Two times a day (BID) | ORAL | 0 refills | Status: AC

## 2024-05-15 NOTE — ED Provider Notes (Signed)
 [place] EMERGENCY DEPARTMENT AT [facility] Provider Note   CSN: [id number] Arrival date & time: 05/15/24  1121     Patient presents with: Dental Pain   Larry Nichols is a 63 y.o. male.   Patient is a 63 year old male who presents to Emergency Department with a chief complaint of left lower dental pain which has been ongoing for approximate past 3 days.  He notes that the pain is worse with chewing.  He notes that he did recently break his left lower second molar.  He denies any associated stridor, dysphagia, drooling, dyspnea.  He has had no fever or chills.   Dental Pain      Prior to Admission medications   Medication Sig Start Date End Date Taking? Authorizing Provider  albuterol (VENTOLIN HFA) 108 (90 Base) MCG/ACT inhaler Inhale 1-2 puffs into the lungs every 6 (six) hours as needed for wheezing or shortness of breath.    [provider]  amLODipine  (NORVASC ) 10 MG tablet Take 1 tablet (10 mg total) by mouth daily. 08/29/20   Gonfa, Taye T, MD  aspirin  EC 81 MG EC tablet Take 1 tablet (81 mg total) by mouth daily. 08/30/20   Gonfa, Taye T, MD  DULoxetine  (CYMBALTA ) 60 MG capsule Take 1 capsule (60 mg total) by mouth daily. Patient taking differently: Take 60 mg by mouth 2 (two) times daily. 04/30/21   Whitfield Raisin, NP  gabapentin  (NEURONTIN ) 300 MG capsule Take 2 capsules (600 mg total) by mouth 3 (three) times daily. Patient taking differently: Take 600 mg by mouth 2 (two) times daily. 05/15/21   Whitfield Raisin, NP  gabapentin  (NEURONTIN ) 600 MG tablet Take 600 mg by mouth 2 (two) times daily. 10/19/22   [provider]  hydrALAZINE  (APRESOLINE ) 100 MG tablet Take 100 mg by mouth 2 (two) times daily. 09/09/21   [provider]  hydrALAZINE  (APRESOLINE ) 50 MG tablet Take 50 mg by mouth 2 (two) times daily. 10/19/22   [provider]  hydrOXYzine (VISTARIL) 25 MG capsule Take 25 mg by mouth 3 (three) times daily as needed for  anxiety. 12/31/20   [provider]  losartan-hydrochlorothiazide (HYZAAR) 100-12.5 MG tablet Take 1 tablet by mouth in the morning. 03/06/21   [provider]  losartan-hydrochlorothiazide (HYZAAR) 50-12.5 MG tablet Take 1 tablet by mouth at bedtime.    [provider]  polyethylene glycol-electrolytes (NULYTELY) 420 g solution Take by mouth once. 08/14/22   [provider]    Allergies: Patient has no known allergies.    Review of Systems  HENT:  Positive for dental problem.   All other systems reviewed and are negative.   Updated Vital Signs BP (!) 159/95   Pulse 70   Temp 98 F (36.7 C)   Resp 18   SpO2 98%   Physical Exam Vitals and nursing note reviewed.  Constitutional:      Appearance: Normal appearance.  HENT:     Head: Normocephalic and atraumatic.     Nose: Nose normal.     Mouth/Throat:     Mouth: Mucous membranes are moist.     Comments: Swelling noted to the left lower gumline, no areas of induration or fluctuance, floor mouth is soft, no peritonsillar swelling, tolerating secretions without difficulty, no trismus Eyes:     Extraocular Movements: Extraocular movements intact.     Conjunctiva/sclera: Conjunctivae normal.     Pupils: Pupils are equal, round, and reactive to light.  Cardiovascular:  Rate and Rhythm: Normal rate and regular rhythm.     Pulses: Normal pulses.     Heart sounds: Normal heart sounds.  Pulmonary:     Effort: Pulmonary effort is normal. No respiratory distress.     Breath sounds: Normal breath sounds. No stridor. No wheezing, rhonchi or rales.  Musculoskeletal:        General: Normal range of motion.     Cervical back: Normal range of motion and neck supple.  Skin:    General: Skin is warm and dry.  Neurological:     General: No focal deficit present.     Mental Status: He is alert and oriented to person, place, and time. Mental status is at baseline.     (all labs ordered are listed, but  only abnormal results are displayed) Labs Reviewed - No data to display  EKG: None  Radiology: No results found.   Procedures   Medications Ordered in the ED - No data to display                                  Medical Decision Making Patient is doing well at this time and is stable for discharge home.  Discussed with patient that we will cover him with some antibiotics at this point for dental infection.  He has no indication for acute peritonsillar abscess, Ludwig's angina, retropharyngeal abscess, epiglottitis.  He has no signs of acute airway compromise.  He has stable vital signs with no indication for sepsis.  The need for close follow-up with a dentist on an outpatient basis was discussed as well as strict turn precautions for any new or worsening symptoms.  Patient voiced understanding and had no additional questions.        Final diagnoses:  None    ED Discharge Orders     None          Daralene Lonni JONETTA DEVONNA 05/15/24 1143    Melvenia Motto, MD 05/15/24 [phone]

## 2024-05-15 NOTE — Discharge Instructions (Signed)
 Please follow-up closely with a dentist on an outpatient basis.  Return to emergency department immediately for any new or worsening symptoms.

## 2024-05-15 NOTE — ED Triage Notes (Signed)
 Pt arrived via POV c/o lower left dental pain and abscess. Pt reports he broke a tooth eating hard candy and now has an abscess. Pt endorses difficulty chewing food.

## 2024-06-07 DIAGNOSIS — E785 Hyperlipidemia, unspecified: Secondary | ICD-10-CM | POA: Diagnosis not present

## 2024-06-07 DIAGNOSIS — I10 Essential (primary) hypertension: Secondary | ICD-10-CM | POA: Diagnosis not present

## 2024-07-08 DIAGNOSIS — E785 Hyperlipidemia, unspecified: Secondary | ICD-10-CM | POA: Diagnosis not present

## 2024-07-08 DIAGNOSIS — I10 Essential (primary) hypertension: Secondary | ICD-10-CM | POA: Diagnosis not present

## 2024-08-07 DIAGNOSIS — E785 Hyperlipidemia, unspecified: Secondary | ICD-10-CM | POA: Diagnosis not present

## 2024-08-07 DIAGNOSIS — I10 Essential (primary) hypertension: Secondary | ICD-10-CM | POA: Diagnosis not present
